# Patient Record
Sex: Male | Born: 1950 | Race: White | Hispanic: No | Smoking: Former smoker
Health system: Southern US, Community
[De-identification: ages and names within clinical notes are randomized; demographics above are authoritative.]

## PROBLEM LIST (undated history)

## (undated) DIAGNOSIS — K219 Gastro-esophageal reflux disease without esophagitis: Secondary | ICD-10-CM

## (undated) DIAGNOSIS — Z9889 Other specified postprocedural states: Secondary | ICD-10-CM

## (undated) DIAGNOSIS — N138 Other obstructive and reflux uropathy: Secondary | ICD-10-CM

## (undated) DIAGNOSIS — J45909 Unspecified asthma, uncomplicated: Secondary | ICD-10-CM

## (undated) DIAGNOSIS — H269 Unspecified cataract: Secondary | ICD-10-CM

## (undated) DIAGNOSIS — R7303 Prediabetes: Secondary | ICD-10-CM

## (undated) DIAGNOSIS — N400 Enlarged prostate without lower urinary tract symptoms: Secondary | ICD-10-CM

## (undated) DIAGNOSIS — I340 Nonrheumatic mitral (valve) insufficiency: Secondary | ICD-10-CM

## (undated) DIAGNOSIS — Z0389 Encounter for observation for other suspected diseases and conditions ruled out: Secondary | ICD-10-CM

## (undated) DIAGNOSIS — Z8679 Personal history of other diseases of the circulatory system: Secondary | ICD-10-CM

## (undated) DIAGNOSIS — R319 Hematuria, unspecified: Secondary | ICD-10-CM

## (undated) DIAGNOSIS — N200 Calculus of kidney: Secondary | ICD-10-CM

## (undated) DIAGNOSIS — G43909 Migraine, unspecified, not intractable, without status migrainosus: Secondary | ICD-10-CM

## (undated) DIAGNOSIS — N401 Enlarged prostate with lower urinary tract symptoms: Secondary | ICD-10-CM

## (undated) DIAGNOSIS — I480 Paroxysmal atrial fibrillation: Secondary | ICD-10-CM

## (undated) DIAGNOSIS — I341 Nonrheumatic mitral (valve) prolapse: Secondary | ICD-10-CM

## (undated) DIAGNOSIS — E785 Hyperlipidemia, unspecified: Secondary | ICD-10-CM

## (undated) HISTORY — PX: TONSILLECTOMY: SUR1361

## (undated) HISTORY — DX: Paroxysmal atrial fibrillation: I48.0

## (undated) HISTORY — DX: Nonrheumatic mitral (valve) insufficiency: I34.0

## (undated) HISTORY — DX: Hyperlipidemia, unspecified: E78.5

## (undated) HISTORY — PX: APPENDECTOMY: SHX54

## (undated) HISTORY — DX: Benign prostatic hyperplasia without lower urinary tract symptoms: N40.0

## (undated) HISTORY — DX: Migraine, unspecified, not intractable, without status migrainosus: G43.909

## (undated) HISTORY — DX: Calculus of kidney: N20.0

## (undated) HISTORY — DX: Other obstructive and reflux uropathy: N13.8

## (undated) HISTORY — PX: COLONOSCOPY: SHX174

## (undated) HISTORY — DX: Gastro-esophageal reflux disease without esophagitis: K21.9

## (undated) HISTORY — DX: Unspecified asthma, uncomplicated: J45.909

## (undated) HISTORY — PX: BACK SURGERY: SHX140

## (undated) HISTORY — DX: Nonrheumatic mitral (valve) prolapse: I34.1

## (undated) HISTORY — DX: Hematuria, unspecified: R31.9

## (undated) HISTORY — DX: Benign prostatic hyperplasia with lower urinary tract symptoms: N40.1

## (undated) HISTORY — DX: Encounter for observation for other suspected diseases and conditions ruled out: Z03.89

---
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
## 2014-04-17 DIAGNOSIS — J45909 Unspecified asthma, uncomplicated: Secondary | ICD-10-CM

## 2014-04-17 DIAGNOSIS — K219 Gastro-esophageal reflux disease without esophagitis: Secondary | ICD-10-CM

## 2014-04-17 DIAGNOSIS — G43909 Migraine, unspecified, not intractable, without status migrainosus: Secondary | ICD-10-CM

## 2014-04-17 DIAGNOSIS — E785 Hyperlipidemia, unspecified: Secondary | ICD-10-CM

## 2014-04-17 DIAGNOSIS — N138 Other obstructive and reflux uropathy: Secondary | ICD-10-CM

## 2014-04-17 DIAGNOSIS — R319 Hematuria, unspecified: Secondary | ICD-10-CM

## 2014-04-17 DIAGNOSIS — N401 Enlarged prostate with lower urinary tract symptoms: Secondary | ICD-10-CM

## 2014-04-17 HISTORY — DX: Gastro-esophageal reflux disease without esophagitis: K21.9

## 2014-04-17 HISTORY — DX: Other obstructive and reflux uropathy: N13.8

## 2014-04-17 HISTORY — DX: Migraine, unspecified, not intractable, without status migrainosus: G43.909

## 2014-04-17 HISTORY — DX: Hematuria, unspecified: R31.9

## 2014-04-17 HISTORY — DX: Hyperlipidemia, unspecified: E78.5

## 2014-04-17 HISTORY — DX: Unspecified asthma, uncomplicated: J45.909

## 2014-11-22 DIAGNOSIS — I341 Nonrheumatic mitral (valve) prolapse: Secondary | ICD-10-CM | POA: Insufficient documentation

## 2015-11-04 DIAGNOSIS — N200 Calculus of kidney: Secondary | ICD-10-CM

## 2015-11-04 DIAGNOSIS — I340 Nonrheumatic mitral (valve) insufficiency: Secondary | ICD-10-CM

## 2015-11-04 DIAGNOSIS — G43909 Migraine, unspecified, not intractable, without status migrainosus: Secondary | ICD-10-CM

## 2015-11-04 DIAGNOSIS — J45909 Unspecified asthma, uncomplicated: Secondary | ICD-10-CM

## 2015-11-04 DIAGNOSIS — E785 Hyperlipidemia, unspecified: Secondary | ICD-10-CM | POA: Insufficient documentation

## 2015-11-04 DIAGNOSIS — N400 Enlarged prostate without lower urinary tract symptoms: Secondary | ICD-10-CM

## 2015-11-04 DIAGNOSIS — I341 Nonrheumatic mitral (valve) prolapse: Secondary | ICD-10-CM

## 2015-11-04 HISTORY — DX: Unspecified asthma, uncomplicated: J45.909

## 2015-11-04 HISTORY — DX: Migraine, unspecified, not intractable, without status migrainosus: G43.909

## 2015-11-04 HISTORY — DX: Benign prostatic hyperplasia without lower urinary tract symptoms: N40.0

## 2015-11-04 HISTORY — DX: Calculus of kidney: N20.0

## 2015-11-04 HISTORY — DX: Nonrheumatic mitral (valve) insufficiency: I34.0

## 2015-11-04 HISTORY — DX: Nonrheumatic mitral (valve) prolapse: I34.1

## 2015-11-04 HISTORY — DX: Hyperlipidemia, unspecified: E78.5

## 2016-10-19 DIAGNOSIS — I340 Nonrheumatic mitral (valve) insufficiency: Secondary | ICD-10-CM

## 2016-10-19 HISTORY — DX: Nonrheumatic mitral (valve) insufficiency: I34.0

## 2017-10-10 DIAGNOSIS — Z0389 Encounter for observation for other suspected diseases and conditions ruled out: Secondary | ICD-10-CM

## 2017-10-10 DIAGNOSIS — IMO0001 Reserved for inherently not codable concepts without codable children: Secondary | ICD-10-CM

## 2017-10-10 HISTORY — DX: Reserved for inherently not codable concepts without codable children: IMO0001

## 2018-05-04 ENCOUNTER — Other Ambulatory Visit: Payer: Self-pay

## 2018-05-14 NOTE — Progress Notes (Signed)
Cardiology Office Note:    Date:  05/15/2018   ID:  Alejandro Burnett, DOB September 11, 1950, MRN [id number]  PCP:  Ignacia Burnett, Alejandro C., MD  Cardiologist:  Norman HerrlichBrian Haydon Dorris, MD   Referring MD: Ignacia Burnett, Alejandro C., MD  ASSESSMENT:    1. Mitral valve prolapse   2. Non-rheumatic mitral regurgitation   3. Dyslipidemia   4. Palpitation    PLAN:    In order of problems listed above:  1. I asked him to another echocardiogram performed with moderate to severe MR especially to evaluate left ventricular volume and function as a general course of primary mitral regurgitation is 1 of progressive deterioration.  If alterations in left ventricular volume function are clearly severe mitral regurgitation he would benefit from intervention.  In the interim we will continue to follow endocarditis prophylaxis 2. See above recheck echocardiogram 3. Continue statin 4. Continues to have infrequent episodes of palpitations associated with shortness of breath he assured me he will purchase an iPhone adapter to capture these particularly at risk for atrial fibrillation  Next appointment one year unless he requires intervention for his mitral valve   Medication Adjustments/Labs and Tests Ordered: Current medicines are reviewed at length with the patient today.  Concerns regarding medicines are outlined above.  Orders Placed This Encounter  Procedures  . EKG 12-Lead  . ECHOCARDIOGRAM COMPLETE   Meds ordered this encounter  Medications  . amoxicillin (AMOXIL) 500 MG capsule    Sig: Take 4 capsules 45 minutes prior to dental procedures.    Dispense:  20 capsule    Refill:  0     Chief Complaint  Patient presents with  . Mitral Regurgitation    History of Present Illness:    Alejandro CraverRandall L Mccannon is a 67 y.o. male who is being seen today for the evaluation of mitral valve disease at the request of Ignacia Burnett, Alejandro C., MD. I had seen him 10/20/16 for his MR as well as episodes of rapid HR. [facility] as per office Dr. Thomes LollingFulk.  He has a history of normal coronary angiography March 2016 and mitral regurgitation felt to be moderate to severe not advised intervention.  His last echocardiogram was performed 06/14/2017 results unavailable.  He also has a history of esophageal stricture with dilation duodenal ulcers and iron deficiency anemia.  In general he is done well he still has infrequent episodes lasting up to a day where he feels indigestion palpitation and mildly short of breath.  We suspect that he is having intermittent atrial arrhythmias never been captured I advised him to purchase an iPhone adapter to record rhythm in the past and he assures me he will do it now.  His last echocardiogram 1 year ago showed moderate to severe mitral regurgitation we will recheck to see if he requires intervention.  When not having palpitation he has no exercise intolerance dyspnea or chest pain.  He has not had no TIA or syncope.  He follows endocarditis prophylaxis with amoxicillin.  Past Medical History:  Diagnosis Date  . Asthma 11/04/2015  . Benign prostatic hyperplasia with urinary obstruction 04/17/2014  . BPH (benign prostatic hyperplasia) 11/04/2015  . Dyslipidemia 11/04/2015  . Gastroesophageal reflux disease 04/17/2014  . Hematuria 04/17/2014   Microscopic  . Hyperlipemia 04/17/2014  . Migraine headache 04/17/2014  . Migraines 11/04/2015  . Mitral regurgitation 11/04/2015  . Mitral valve prolapse 11/04/2015  . Nephrolithiasis 11/04/2015  . Non-rheumatic mitral regurgitation 10/19/2016  Moderate  . Normal coronary arteries 10/10/2017    Past Surgical History:  Procedure Laterality Date  . APPENDECTOMY    . BACK SURGERY    . TONSILLECTOMY      Current Medications: Current Meds  Medication Sig  . albuterol (PROVENTIL HFA;VENTOLIN HFA) 108 (90 Base) MCG/ACT inhaler Inhale 1 puff into the lungs every 6 (six) hours as needed for wheezing or shortness of breath.   . esomeprazole  (NEXIUM) 40 MG capsule Take 40 mg by mouth daily.   . finasteride (PROSCAR) 5 MG tablet Take 5 mg by mouth daily.   . fluticasone-salmeterol (ADVAIR HFA) 115-21 MCG/ACT inhaler Inhale 2 puffs into the lungs daily as needed.   . montelukast (SINGULAIR) 10 MG tablet Take 10 mg by mouth at bedtime.   . ranitidine (ZANTAC) 150 MG tablet Take 150 mg by mouth daily as needed.   . simvastatin (ZOCOR) 20 MG tablet Take 20 mg by mouth daily.      Allergies:   Patient has no known allergies.   Social History   Socioeconomic History  . Marital status: Married    Spouse name: Not on file  . Number of children: Not on file  . Years of education: Not on file  . Highest education level: Not on file  Occupational History  . Not on file  Social Needs  . Financial resource strain: Not on file  . Food insecurity:    Worry: Not on file    Inability: Not on file  . Transportation needs:    Medical: Not on file    Non-medical: Not on file  Tobacco Use  . Smoking status: Former Smoker    Last attempt to quit: 1986    Years since quitting: 33.7  . Smokeless tobacco: Former Engineer, water and Sexual Activity  . Alcohol use: Never    Frequency: Never  . Drug use: Never  . Sexual activity: Not on file  Lifestyle  . Physical activity:    Days per week: Not on file    Minutes per session: Not on file  . Stress: Not on file  Relationships  . Social connections:    Talks on phone: Not on file    Gets together: Not on file    Attends religious service: Not on file    Active member of club or organization: Not on file    Attends meetings of clubs or organizations: Not on file    Relationship status: Not on file  Other Topics Concern  . Not on file  Social History Narrative  . Not on file     Family History: The patient's family history includes Cancer in his father; Hypertension in his mother.  ROS:   Review of Systems  Constitution: Negative.  HENT: Negative.   Eyes: Negative.     Cardiovascular: Positive for palpitations.  Respiratory: Positive for shortness of breath.   Endocrine: Negative.   Hematologic/Lymphatic: Negative.   Skin: Negative.   Musculoskeletal: Negative.   Gastrointestinal: Negative.   Genitourinary: Negative.   Psychiatric/Behavioral: Negative.   Allergic/Immunologic: Negative.    Please see the history of present illness.     All other systems reviewed and are negative.  EKGs/Labs/Other Studies Reviewed:    The following studies were reviewed today:   EKG:  EKG is  ordered today.  The ekg ordered today demonstrates SRTH normal  Recent Labs: No results found for requested labs within last 8760 hours.  Recent Lipid Panel No results found  for: CHOL, TRIG, HDL, CHOLHDL, VLDL, LDLCALC, LDLDIRECT  Physical Exam:    VS:  BP 118/78 (BP Location: Right Arm, Patient Position: Sitting, Cuff Size: Normal)   Pulse 81   Ht 6\' 1"  (1.854 m)   Wt 195 lb 9.6 oz (88.7 kg)   SpO2 95%   BMI 25.81 kg/m     Wt Readings from Last 3 Encounters:  05/15/18 195 lb 9.6 oz (88.7 kg)     GEN:  Well nourished, well developed in no acute distress HEENT: Normal NECK: No JVD; No carotid bruits LYMPHATICS: No lymphadenopathy CARDIAC: 3/6 MR radiated to aortic area RRR, RESPIRATORY:  Clear to auscultation without rales, wheezing or rhonchi  ABDOMEN: Soft, non-tender, non-distended MUSCULOSKELETAL:  No edema; No deformity  SKIN: Warm and dry NEUROLOGIC:  Alert and oriented x 3 PSYCHIATRIC:  Normal affect     Signed, Norman Herrlich, MD  05/15/2018 2:05 PM    [facility]

## 2018-05-15 ENCOUNTER — Ambulatory Visit (INDEPENDENT_AMBULATORY_CARE_PROVIDER_SITE_OTHER): Payer: Medicare Other | Admitting: Cardiology

## 2018-05-15 VITALS — BP 118/78 | HR 81 | Ht 73.0 in | Wt 195.6 lb

## 2018-05-15 DIAGNOSIS — E785 Hyperlipidemia, unspecified: Secondary | ICD-10-CM

## 2018-05-15 DIAGNOSIS — I340 Nonrheumatic mitral (valve) insufficiency: Secondary | ICD-10-CM | POA: Diagnosis not present

## 2018-05-15 DIAGNOSIS — I341 Nonrheumatic mitral (valve) prolapse: Secondary | ICD-10-CM | POA: Diagnosis not present

## 2018-05-15 DIAGNOSIS — R002 Palpitations: Secondary | ICD-10-CM | POA: Insufficient documentation

## 2018-05-15 HISTORY — DX: Palpitations: R00.2

## 2018-05-15 MED ORDER — AMOXICILLIN 500 MG PO CAPS: ORAL_CAPSULE | ORAL | 0 refills | Status: DC

## 2018-05-15 NOTE — Patient Instructions (Addendum)
Medication Instructions:  Your physician has recommended you make the following change in your medication:   START amoxicillin 500 mg: Take 4 capsules 45 minutes prior to dental procedures  Labwork: None  Testing/Procedures: You had an EKG today.   Your physician has requested that you have an echocardiogram. Echocardiography is a painless test that uses sound waves to create images of your heart. It provides your doctor with information about the size and shape of your heart and how well your heart's chambers and valves are working. This procedure takes approximately one hour. There are no restrictions for this procedure.  Follow-Up: Your physician wants you to follow-up in: 1 year. You will receive a reminder letter in the mail two months in advance. If you don't receive a letter, please call our office to schedule the follow-up appointment.   If you need a refill on your cardiac medications before your next appointment, please call your pharmacy.   Thank you for choosing [facility]! Mady Gemmaatherine Lockhart, RN [phone]     [email or web address] [email or web address]        FDA-cleared, clinical grade mobile EKG monitor: Lourena SimmondsKardia is the most clinically-validated mobile EKG used by the world's leading cardiac care medical professionals With Basic service, know instantly if your heart rhythm is normal or if atrial fibrillation is detected, and email the last single EKG recording to yourself or your doctor Premium service, available for purchase through the Kardia app for $9.99 per month or $99 per year, includes unlimited history and storage of your EKG recordings, a monthly EKG summary report to share with your doctor, along with the ability to track your blood pressure, activity and weight Includes one KardiaMobile phone clip FREE SHIPPING: Standard delivery 1-3 business days. Orders placed by 11:00am PST will ship that afternoon. Otherwise, will ship next  business day. All orders ship via [employer] Priority Mail from [place], [place]    Echocardiogram An echocardiogram, or echocardiography, uses sound waves (ultrasound) to produce an image of your heart. The echocardiogram is simple, painless, obtained within a short period of time, and offers valuable information to your health care provider. The images from an echocardiogram can provide information such as:  Evidence of coronary artery disease (CAD).  Heart size.  Heart muscle function.  Heart valve function.  Aneurysm detection.  Evidence of a past heart attack.  Fluid buildup around the heart.  Heart muscle thickening.  Assess heart valve function.  Tell a health care provider about:  Any allergies you have.  All medicines you are taking, including vitamins, herbs, eye drops, creams, and over-the-counter medicines.  Any problems you or family members have had with anesthetic medicines.  Any blood disorders you have.  Any surgeries you have had.  Any medical conditions you have.  Whether you are pregnant or may be pregnant. What happens before the procedure? No special preparation is needed. Eat and drink normally. What happens during the procedure?  In order to produce an image of your heart, gel will be applied to your chest and a wand-like tool (transducer) will be moved over your chest. The gel will help transmit the sound waves from the transducer. The sound waves will harmlessly bounce off your heart to allow the heart images to be captured in real-time motion. These images will then be recorded.  You may need an IV to receive a medicine that improves the quality of the pictures. What happens after the procedure? You may return to your normal schedule including diet, activities, and medicines,  unless your health care provider tells you otherwise. This information is not intended to replace advice given to you by your health care provider. Make sure you discuss any  questions you have with your health care provider. Document Released: 08/06/2000 Document Revised: 03/27/2016 Document Reviewed: 04/16/2013 Elsevier Interactive Patient Education  2017 Elsevier Inc.   Amoxicillin capsules or tablets What is this medicine? AMOXICILLIN (a mox i SIL in) is a penicillin antibiotic. It is used to treat certain kinds of bacterial infections. It will not work for colds, flu, or other viral infections. This medicine may be used for other purposes; ask your health care provider or pharmacist if you have questions. COMMON BRAND NAME(S): Amoxil, Moxilin, Sumox, Trimox What should I tell my health care provider before I take this medicine? They need to know if you have any of these conditions: -asthma -kidney disease -an unusual or allergic reaction to amoxicillin, other penicillins, cephalosporin antibiotics, other medicines, foods, dyes, or preservatives -pregnant or trying to get pregnant -breast-feeding How should I use this medicine? Take this medicine by mouth with a glass of water. Follow the directions on your prescription label. You may take this medicine with food or on an empty stomach. Take your medicine at regular intervals. Do not take your medicine more often than directed. Take all of your medicine as directed even if you think your are better. Do not skip doses or stop your medicine early. Talk to your pediatrician regarding the use of this medicine in children. While this drug may be prescribed for selected conditions, precautions do apply. Overdosage: If you think you have taken too much of this medicine contact a poison control center or emergency room at once. NOTE: This medicine is only for you. Do not share this medicine with others. What if I miss a dose? If you miss a dose, take it as soon as you can. If it is almost time for your next dose, take only that dose. Do not take double or extra doses. What may interact with this  medicine? -amiloride -birth control pills -chloramphenicol -macrolides -probenecid -sulfonamides -tetracyclines This list may not describe all possible interactions. Give your health care provider a list of all the medicines, herbs, non-prescription drugs, or dietary supplements you use. Also tell them if you smoke, drink alcohol, or use illegal drugs. Some items may interact with your medicine. What should I watch for while using this medicine? Tell your doctor or health care professional if your symptoms do not improve in 2 or 3 days. Take all of the doses of your medicine as directed. Do not skip doses or stop your medicine early. If you are diabetic, you may get a false positive result for sugar in your urine with certain brands of urine tests. Check with your doctor. Do not treat diarrhea with over-the-counter products. Contact your doctor if you have diarrhea that lasts more than 2 days or if the diarrhea is severe and watery. What side effects may I notice from receiving this medicine? Side effects that you should report to your doctor or health care professional as soon as possible: -allergic reactions like skin rash, itching or hives, swelling of the face, lips, or tongue -breathing problems -dark urine -redness, blistering, peeling or loosening of the skin, including inside the mouth -seizures -severe or watery diarrhea -trouble passing urine or change in the amount of urine -unusual bleeding or bruising -unusually weak or tired -yellowing of the eyes or skin Side effects that usually do not require  medical attention (report to your doctor or health care professional if they continue or are bothersome): -dizziness -headache -stomach upset -trouble sleeping This list may not describe all possible side effects. Call your doctor for medical advice about side effects. You may report side effects to FDA at 1-800-FDA-1088. Where should I keep my medicine? Keep out of the reach of  children. Store between 68 and 77 degrees F (20 and 25 degrees C). Keep bottle closed tightly. Throw away any unused medicine after the expiration date. NOTE: This sheet is a summary. It may not cover all possible information. If you have questions about this medicine, talk to your doctor, pharmacist, or health care provider.  2018 Elsevier/Gold Standard (2007-10-31 14:10:59)

## 2018-06-27 ENCOUNTER — Ambulatory Visit (INDEPENDENT_AMBULATORY_CARE_PROVIDER_SITE_OTHER): Payer: Medicare Other

## 2018-06-27 DIAGNOSIS — I341 Nonrheumatic mitral (valve) prolapse: Secondary | ICD-10-CM

## 2018-06-27 DIAGNOSIS — I340 Nonrheumatic mitral (valve) insufficiency: Secondary | ICD-10-CM

## 2018-06-27 NOTE — Progress Notes (Signed)
Complete echocardiogram has been performed.  Jimmy Vetta Couzens RDCS, RVT 

## 2018-08-07 NOTE — H&P (View-Only) (Signed)
Cardiology Office Note:    Date:  08/08/2018   ID:  Alejandro Burnett, DOB 01/11/1951, MRN [id number]  PCP:  Ignacia Burnett, Alejandro C., MD  Cardiologist:  Alejandro HerrlichBrian Kinslee Dalpe, MD    Referring MD: Ignacia Burnett, Alejandro C., MD    ASSESSMENT:    1. Mitral valve prolapse   2. Nonrheumatic mitral valve regurgitation   3. Chest pain, unspecified type    PLAN:    In order of problems listed above:  1. Clinically with symptoms physical exam I suspect he is progressed to severe mitral regurgitation previously he had moderate to severe I cannot explain the recent echocardiogram suggesting was mild I have asked him to undergo transesophageal echocardiogram and likely will require consideration of mitral valve repair. 2. Clinically symptomatic although he is not in heart failure he will go and have a transesophageal echocardiogram and further decision-making. 3. Vague symptoms nonanginal at this time will hold on ischemia evaluation as he had normal coronary arteriography in 2016   Next appointment: After his transesophageal echocardiogram the first week of January   Medication Adjustments/Labs and Tests Ordered: Current medicines are reviewed at length with the patient today.  Concerns regarding medicines are outlined above.  Orders Placed This Encounter  Procedures  . CBC  . Basic metabolic panel  . EKG 12-Lead   No orders of the defined types were placed in this encounter.   Chief Complaint  Patient presents with  . Follow-up    at patient request for chest pain    History of Present Illness:    Alejandro Burnett is a 67 y.o. male with a hx of MVP MR and palpitation last seen 05/15/18.  He has a history of normal coronary angiography March 2016 and mitral regurgitation felt to be moderate to severe not advised intervention.  His last echocardiogram was performed 06/14/2017 results unavailable.  He also has a history of esophageal stricture with dilation duodenal ulcers and iron deficiency anemia. Compliance  with diet, lifestyle and medications: Yes  He has not felt well for several weeks there is a day that he was loading a truck and he became very weak lightheaded near syncope and was unable to do physical effort he required assistance.  Since that time he sometimes finds himself short of breath walking outdoors doing activities that were present previously easy and more aware of her heart beating with irregularity.  In summary he just has never felt the same and his predominant problem is exercise intolerance and dyspnea.  His physical examination is very prominent for mitral regurgitation and severely out of proportion to recent echocardiogram suggesting a mild degree I will myself at the end of office hours review that echo "by physical exam he has severe mitral regurgitation he has progressed quickly clearly has a phenotype of mitral valve prolapse with chest wall deformity and scoliosis and I suspect that he has progressed to severe symptomatic mitral regurgitation.  He has been referred for transesophageal echocardiogram.  By EKG he is in sinus rhythm and has no evidence of overt congestive heart failure he has had no fever or chills.  Benefits risk and options detailed consent obtained for TEE.  He has no edema orthopnea or syncope.  I reviewed his recent transthoracic echocardiogram.  He has severe bileaflet prolapse and has a jet directed anteriorly and medially and I suspect that this is more severe than mild as it was interpreted and there is flow reversal in the pulmonary vein. Past Medical History:  Diagnosis  Date  . Asthma 11/04/2015  . Benign prostatic hyperplasia with urinary obstruction 04/17/2014  . BPH (benign prostatic hyperplasia) 11/04/2015  . Dyslipidemia 11/04/2015  . Gastroesophageal reflux disease 04/17/2014  . Hematuria 04/17/2014   Microscopic  . Hyperlipemia 04/17/2014  . Migraine headache 04/17/2014  . Migraines 11/04/2015  . Mitral regurgitation 11/04/2015  . Mitral valve  prolapse 11/04/2015  . Nephrolithiasis 11/04/2015  . Non-rheumatic mitral regurgitation 10/19/2016   Moderate  . Normal coronary arteries 10/10/2017    Past Surgical History:  Procedure Laterality Date  . APPENDECTOMY    . BACK SURGERY    . TONSILLECTOMY      Current Medications: Current Meds  Medication Sig  . albuterol (PROVENTIL HFA;VENTOLIN HFA) 108 (90 Base) MCG/ACT inhaler Inhale 1 puff into the lungs every 6 (six) hours as needed for wheezing or shortness of breath.   Marland Kitchen amoxicillin (AMOXIL) 500 MG capsule Take 4 capsules 45 minutes prior to dental procedures.  Marland Kitchen esomeprazole (NEXIUM) 40 MG capsule Take 40 mg by mouth daily.   . finasteride (PROSCAR) 5 MG tablet Take 5 mg by mouth daily.   . fluticasone-salmeterol (ADVAIR HFA) 115-21 MCG/ACT inhaler Inhale 2 puffs into the lungs daily as needed.   . montelukast (SINGULAIR) 10 MG tablet Take 10 mg by mouth at bedtime.   . ranitidine (ZANTAC) 150 MG tablet Take 150 mg by mouth daily as needed.   . simvastatin (ZOCOR) 20 MG tablet Take 20 mg by mouth daily.      Allergies:   Patient has no known allergies.   Social History   Socioeconomic History  . Marital status: Married    Spouse name: Not on file  . Number of children: Not on file  . Years of education: Not on file  . Highest education level: Not on file  Occupational History  . Not on file  Social Needs  . Financial resource strain: Not on file  . Food insecurity:    Worry: Not on file    Inability: Not on file  . Transportation needs:    Medical: Not on file    Non-medical: Not on file  Tobacco Use  . Smoking status: Former Smoker    Last attempt to quit: 1986    Years since quitting: 33.9  . Smokeless tobacco: Former Engineer, water and Sexual Activity  . Alcohol use: Never    Frequency: Never  . Drug use: Never  . Sexual activity: Not on file  Lifestyle  . Physical activity:    Days per week: Not on file    Minutes per session: Not on file  .  Stress: Not on file  Relationships  . Social connections:    Talks on phone: Not on file    Gets together: Not on file    Attends religious service: Not on file    Active member of club or organization: Not on file    Attends meetings of clubs or organizations: Not on file    Relationship status: Not on file  Other Topics Concern  . Not on file  Social History Narrative  . Not on file     Family History: The patient's family history includes Cancer in his father; Hypertension in his mother. ROS:   Please see the history of present illness.    All other systems reviewed and are negative.  EKGs/Labs/Other Studies Reviewed:    The following studies were reviewed today:  EKG:  EKG ordered today.  The ekg ordered today  demonstrates sinus rhythm single PVC  Recent Labs: No results found for requested labs within last 8760 hours.  Recent Lipid Panel No results found for: CHOL, TRIG, HDL, CHOLHDL, VLDL, LDLCALC, LDLDIRECT  Physical Exam:    VS:  BP (!) 152/88 (BP Location: Right Arm, Patient Position: Sitting, Cuff Size: Normal)   Pulse 82   Ht 6\' 1"  (1.854 m)   Wt 190 lb 2 oz (86.2 kg)   SpO2 97%   BMI 25.08 kg/m     Wt Readings from Last 3 Encounters:  08/08/18 190 lb 2 oz (86.2 kg)  05/15/18 195 lb 9.6 oz (88.7 kg)     GEN: Tall thin scoliosis pectus excavatum deformity well nourished, well developed in no acute distress HEENT: Normal NECK: No JVD; No carotid bruits LYMPHATICS: No lymphadenopathy CARDIAC: He has a grade 4/6 murmur of mitral regurgitation and radiates through the posterior scapula up into the aortic area and has an apical thrill.  P2 is normal.  RRR, rubs, gallops RESPIRATORY:  Clear to auscultation without rales, wheezing or rhonchi  ABDOMEN: Soft, non-tender, non-distended MUSCULOSKELETAL:  No edema; No deformity  SKIN: Warm and dry NEUROLOGIC:  Alert and oriented x 3 PSYCHIATRIC:  Normal affect    Signed, Alejandro Herrlich, MD  08/08/2018 4:47 PM     [facility]

## 2018-08-07 NOTE — Progress Notes (Addendum)
Cardiology Office Note:    Date:  08/08/2018   ID:  Alejandro Burnett, DOB 11/07/1950, MRN [id number]  PCP:  Beck, Mark C., MD  Cardiologist:  Filicia Scogin, MD    Referring MD: Beck, Mark C., MD    ASSESSMENT:    1. Mitral valve prolapse   2. Nonrheumatic mitral valve regurgitation   3. Chest pain, unspecified type    PLAN:    In order of problems listed above:  1. Clinically with symptoms physical exam I suspect he is progressed to severe mitral regurgitation previously he had moderate to severe I cannot explain the recent echocardiogram suggesting was mild I have asked him to undergo transesophageal echocardiogram and likely will require consideration of mitral valve repair. 2. Clinically symptomatic although he is not in heart failure he will go and have a transesophageal echocardiogram and further decision-making. 3. Vague symptoms nonanginal at this time will hold on ischemia evaluation as he had normal coronary arteriography in 2016   Next appointment: After his transesophageal echocardiogram the first week of January   Medication Adjustments/Labs and Tests Ordered: Current medicines are reviewed at length with the patient today.  Concerns regarding medicines are outlined above.  Orders Placed This Encounter  Procedures  . CBC  . Basic metabolic panel  . EKG 12-Lead   No orders of the defined types were placed in this encounter.   Chief Complaint  Patient presents with  . Follow-up    at patient request for chest pain    History of Present Illness:    Alejandro Burnett is a 67 y.o. male with a hx of MVP MR and palpitation last seen 05/15/18.  He has a history of normal coronary angiography March 2016 and mitral regurgitation felt to be moderate to severe not advised intervention.  His last echocardiogram was performed 06/14/2017 results unavailable.  He also has a history of esophageal stricture with dilation duodenal ulcers and iron deficiency anemia. Compliance  with diet, lifestyle and medications: Yes  He has not felt well for several weeks there is a day that he was loading a truck and he became very weak lightheaded near syncope and was unable to do physical effort he required assistance.  Since that time he sometimes finds himself short of breath walking outdoors doing activities that were present previously easy and more aware of her heart beating with irregularity.  In summary he just has never felt the same and his predominant problem is exercise intolerance and dyspnea.  His physical examination is very prominent for mitral regurgitation and severely out of proportion to recent echocardiogram suggesting a mild degree I will myself at the end of office hours review that echo "by physical exam he has severe mitral regurgitation he has progressed quickly clearly has a phenotype of mitral valve prolapse with chest wall deformity and scoliosis and I suspect that he has progressed to severe symptomatic mitral regurgitation.  He has been referred for transesophageal echocardiogram.  By EKG he is in sinus rhythm and has no evidence of overt congestive heart failure he has had no fever or chills.  Benefits risk and options detailed consent obtained for TEE.  He has no edema orthopnea or syncope.  I reviewed his recent transthoracic echocardiogram.  He has severe bileaflet prolapse and has a jet directed anteriorly and medially and I suspect that this is more severe than mild as it was interpreted and there is flow reversal in the pulmonary vein. Past Medical History:  Diagnosis   Date  . Asthma 11/04/2015  . Benign prostatic hyperplasia with urinary obstruction 04/17/2014  . BPH (benign prostatic hyperplasia) 11/04/2015  . Dyslipidemia 11/04/2015  . Gastroesophageal reflux disease 04/17/2014  . Hematuria 04/17/2014   Microscopic  . Hyperlipemia 04/17/2014  . Migraine headache 04/17/2014  . Migraines 11/04/2015  . Mitral regurgitation 11/04/2015  . Mitral valve  prolapse 11/04/2015  . Nephrolithiasis 11/04/2015  . Non-rheumatic mitral regurgitation 10/19/2016   Moderate  . Normal coronary arteries 10/10/2017    Past Surgical History:  Procedure Laterality Date  . APPENDECTOMY    . BACK SURGERY    . TONSILLECTOMY      Current Medications: Current Meds  Medication Sig  . albuterol (PROVENTIL HFA;VENTOLIN HFA) 108 (90 Base) MCG/ACT inhaler Inhale 1 puff into the lungs every 6 (six) hours as needed for wheezing or shortness of breath.   . amoxicillin (AMOXIL) 500 MG capsule Take 4 capsules 45 minutes prior to dental procedures.  . esomeprazole (NEXIUM) 40 MG capsule Take 40 mg by mouth daily.   . finasteride (PROSCAR) 5 MG tablet Take 5 mg by mouth daily.   . fluticasone-salmeterol (ADVAIR HFA) 115-21 MCG/ACT inhaler Inhale 2 puffs into the lungs daily as needed.   . montelukast (SINGULAIR) 10 MG tablet Take 10 mg by mouth at bedtime.   . ranitidine (ZANTAC) 150 MG tablet Take 150 mg by mouth daily as needed.   . simvastatin (ZOCOR) 20 MG tablet Take 20 mg by mouth daily.      Allergies:   Patient has no known allergies.   Social History   Socioeconomic History  . Marital status: Married    Spouse name: Not on file  . Number of children: Not on file  . Years of education: Not on file  . Highest education level: Not on file  Occupational History  . Not on file  Social Needs  . Financial resource strain: Not on file  . Food insecurity:    Worry: Not on file    Inability: Not on file  . Transportation needs:    Medical: Not on file    Non-medical: Not on file  Tobacco Use  . Smoking status: Former Smoker    Last attempt to quit: 1986    Years since quitting: 33.9  . Smokeless tobacco: Former User  Substance and Sexual Activity  . Alcohol use: Never    Frequency: Never  . Drug use: Never  . Sexual activity: Not on file  Lifestyle  . Physical activity:    Days per week: Not on file    Minutes per session: Not on file  .  Stress: Not on file  Relationships  . Social connections:    Talks on phone: Not on file    Gets together: Not on file    Attends religious service: Not on file    Active member of club or organization: Not on file    Attends meetings of clubs or organizations: Not on file    Relationship status: Not on file  Other Topics Concern  . Not on file  Social History Narrative  . Not on file     Family History: The patient's family history includes Cancer in his father; Hypertension in his mother. ROS:   Please see the history of present illness.    All other systems reviewed and are negative.  EKGs/Labs/Other Studies Reviewed:    The following studies were reviewed today:  EKG:  EKG ordered today.  The ekg ordered today   demonstrates sinus rhythm single PVC  Recent Labs: No results found for requested labs within last 8760 hours.  Recent Lipid Panel No results found for: CHOL, TRIG, HDL, CHOLHDL, VLDL, LDLCALC, LDLDIRECT  Physical Exam:    VS:  BP (!) 152/88 (BP Location: Right Arm, Patient Position: Sitting, Cuff Size: Normal)   Pulse 82   Ht 6' 1" (1.854 m)   Wt 190 lb 2 oz (86.2 kg)   SpO2 97%   BMI 25.08 kg/m     Wt Readings from Last 3 Encounters:  08/08/18 190 lb 2 oz (86.2 kg)  05/15/18 195 lb 9.6 oz (88.7 kg)     GEN: Tall thin scoliosis pectus excavatum deformity well nourished, well developed in no acute distress HEENT: Normal NECK: No JVD; No carotid bruits LYMPHATICS: No lymphadenopathy CARDIAC: He has a grade 4/6 murmur of mitral regurgitation and radiates through the posterior scapula up into the aortic area and has an apical thrill.  P2 is normal.  RRR, rubs, gallops RESPIRATORY:  Clear to auscultation without rales, wheezing or rhonchi  ABDOMEN: Soft, non-tender, non-distended MUSCULOSKELETAL:  No edema; No deformity  SKIN: Warm and dry NEUROLOGIC:  Alert and oriented x 3 PSYCHIATRIC:  Normal affect    Signed, Carleigh Buccieri, MD  08/08/2018 4:47 PM     [facility]  

## 2018-08-08 ENCOUNTER — Encounter: Payer: Self-pay | Admitting: Cardiology

## 2018-08-08 ENCOUNTER — Ambulatory Visit (INDEPENDENT_AMBULATORY_CARE_PROVIDER_SITE_OTHER): Payer: Medicare Other | Admitting: Cardiology

## 2018-08-08 VITALS — BP 152/88 | HR 82 | Ht 73.0 in | Wt 190.1 lb

## 2018-08-08 DIAGNOSIS — I341 Nonrheumatic mitral (valve) prolapse: Secondary | ICD-10-CM

## 2018-08-08 DIAGNOSIS — R079 Chest pain, unspecified: Secondary | ICD-10-CM

## 2018-08-08 DIAGNOSIS — I340 Nonrheumatic mitral (valve) insufficiency: Secondary | ICD-10-CM | POA: Diagnosis not present

## 2018-08-08 HISTORY — DX: Chest pain, unspecified: R07.9

## 2018-08-08 NOTE — Patient Instructions (Signed)
Medication Instructions:  Your physician recommends that you continue on your current medications as directed. Please refer to the Current Medication list given to you today.  If you need a refill on your cardiac medications before your next appointment, please call your pharmacy.   Lab work:  Please return to our office for lab work on 08-17-18: CBC and BMP  Testing/Procedures: You are scheduled for a TEE/Cardioversion/TEE Cardioversion on Jan. 2, 2020  with Dr. Moreen Fowlerroitora.  Please [facility]ance [facility] at [facility]: [address] [place], [place] [address] at 11 am/pm. (1 hour prior to procedure unless lab work is needed; if lab work is needed arrive 1.5 hours ahead)  DIET: Nothing to eat or drink after midnight except a sip of water with medications (see medication instructions below)  Medication Instructions:   Labs:  Please return to office for CBC and BMP next week on 08-17-18 You must have a responsible person to drive you home and stay in the waiting area during your procedure. Failure to do so could result in cancellation.  Bring your insurance cards.  *Special Note: Every effort is made to have your procedure done on time. Occasionally there are emergencies that occur at the hospital that may cause delays. Please be patient if a delay does occur.    Follow-Up: At [facility], you and your health needs are our priority.  As part of our continuing mission to provide you with exceptional heart care, we have created designated Provider Care Teams.  These Care Teams include your primary Cardiologist (physician) and Advanced Practice Providers (APPs -  Physician Assistants and Nurse Practitioners) who all work together to provide you with the care you need, when you need it. You will need a follow up appointment in Jan 2020 after procedure.

## 2018-08-18 LAB — CBC
Hematocrit: 40.9 % (ref 37.5–51.0)
Hemoglobin: 13.8 g/dL (ref 13.0–17.7)
MCH: 29.8 pg (ref 26.6–33.0)
MCHC: 33.7 g/dL (ref 31.5–35.7)
MCV: 88 fL (ref 79–97)
Platelets: 142 10*3/uL — ABNORMAL LOW (ref 150–450)
RBC: 4.63 x10E6/uL (ref 4.14–5.80)
RDW: 12 % — AB (ref 12.3–15.4)
WBC: 6.5 10*3/uL (ref 3.4–10.8)

## 2018-08-18 LAB — BASIC METABOLIC PANEL
BUN / CREAT RATIO: 18 (ref 10–24)
BUN: 21 mg/dL (ref 8–27)
CO2: 22 mmol/L (ref 20–29)
Calcium: 9.2 mg/dL (ref 8.6–10.2)
Chloride: 105 mmol/L (ref 96–106)
Creatinine, Ser: 1.14 mg/dL (ref 0.76–1.27)
GFR calc non Af Amer: 67 mL/min/{1.73_m2} (ref >59)
GFR, EST AFRICAN AMERICAN: 78 mL/min/{1.73_m2} (ref >59)
Glucose: 120 mg/dL — ABNORMAL HIGH (ref 65–99)
Potassium: 3.9 mmol/L (ref 3.5–5.2)
Sodium: 140 mmol/L (ref 134–144)

## 2018-08-24 ENCOUNTER — Encounter (HOSPITAL_COMMUNITY): Payer: Self-pay | Admitting: *Deleted

## 2018-08-24 ENCOUNTER — Ambulatory Visit (HOSPITAL_COMMUNITY)
Admission: RE | Admit: 2018-08-24 | Discharge: 2018-08-24 | Disposition: A | Discharge status: Home / Self Care | Payer: Medicare Other | Attending: Cardiovascular Disease | Admitting: Cardiovascular Disease

## 2018-08-24 ENCOUNTER — Ambulatory Visit (HOSPITAL_BASED_OUTPATIENT_CLINIC_OR_DEPARTMENT_OTHER): Payer: Medicare Other

## 2018-08-24 ENCOUNTER — Other Ambulatory Visit: Payer: Self-pay

## 2018-08-24 ENCOUNTER — Encounter (HOSPITAL_COMMUNITY)
Admission: RE | Disposition: A | Discharge status: Home / Self Care | Payer: Self-pay | Source: Home / Self Care | Attending: Cardiovascular Disease

## 2018-08-24 DIAGNOSIS — J45909 Unspecified asthma, uncomplicated: Secondary | ICD-10-CM | POA: Diagnosis not present

## 2018-08-24 DIAGNOSIS — I340 Nonrheumatic mitral (valve) insufficiency: Secondary | ICD-10-CM | POA: Insufficient documentation

## 2018-08-24 DIAGNOSIS — Z87891 Personal history of nicotine dependence: Secondary | ICD-10-CM | POA: Diagnosis not present

## 2018-08-24 DIAGNOSIS — N401 Enlarged prostate with lower urinary tract symptoms: Secondary | ICD-10-CM | POA: Insufficient documentation

## 2018-08-24 DIAGNOSIS — E785 Hyperlipidemia, unspecified: Secondary | ICD-10-CM | POA: Diagnosis not present

## 2018-08-24 DIAGNOSIS — K219 Gastro-esophageal reflux disease without esophagitis: Secondary | ICD-10-CM | POA: Diagnosis not present

## 2018-08-24 DIAGNOSIS — G43909 Migraine, unspecified, not intractable, without status migrainosus: Secondary | ICD-10-CM | POA: Diagnosis not present

## 2018-08-24 DIAGNOSIS — N138 Other obstructive and reflux uropathy: Secondary | ICD-10-CM | POA: Diagnosis not present

## 2018-08-24 DIAGNOSIS — I341 Nonrheumatic mitral (valve) prolapse: Secondary | ICD-10-CM

## 2018-08-24 HISTORY — PX: TEE WITHOUT CARDIOVERSION: SHX5443

## 2018-08-24 SURGERY — ECHOCARDIOGRAM, TRANSESOPHAGEAL
Anesthesia: Moderate Sedation

## 2018-08-24 MED ORDER — DIPHENHYDRAMINE HCL 50 MG/ML IJ SOLN
INTRAMUSCULAR | Status: AC
  Filled 2018-08-24: qty 1

## 2018-08-24 MED ORDER — LIDOCAINE VISCOUS HCL 2 % MT SOLN
OROMUCOSAL | Status: AC
  Filled 2018-08-24: qty 15

## 2018-08-24 MED ORDER — MIDAZOLAM HCL (PF) 5 MG/ML IJ SOLN
INTRAMUSCULAR | Status: AC
  Filled 2018-08-24: qty 2

## 2018-08-24 MED ORDER — FENTANYL CITRATE (PF) 100 MCG/2ML IJ SOLN
INTRAMUSCULAR | Status: DC | PRN
  Administered 2018-08-24 (×2): 25 ug via INTRAVENOUS

## 2018-08-24 MED ORDER — SODIUM CHLORIDE 0.9 % IV SOLN
INTRAVENOUS | Status: DC
  Administered 2018-08-24: 10:00:00 via INTRAVENOUS

## 2018-08-24 MED ORDER — MIDAZOLAM HCL (PF) 10 MG/2ML IJ SOLN
INTRAMUSCULAR | Status: DC | PRN
  Administered 2018-08-24 (×2): 2 mg via INTRAVENOUS

## 2018-08-24 MED ORDER — LIDOCAINE VISCOUS HCL 2 % MT SOLN
OROMUCOSAL | Status: DC | PRN
  Administered 2018-08-24: 10 mL via OROMUCOSAL

## 2018-08-24 MED ORDER — FENTANYL CITRATE (PF) 100 MCG/2ML IJ SOLN
INTRAMUSCULAR | Status: AC
  Filled 2018-08-24: qty 2

## 2018-08-24 NOTE — Op Note (Signed)
INDICATIONS: mitral regurgitation  PROCEDURE:   Informed consent was obtained prior to the procedure. The risks, benefits and alternatives for the procedure were discussed and the patient comprehended these risks.  Risks include, but are not limited to, cough, sore throat, vomiting, nausea, somnolence, esophageal and stomach trauma or perforation, bleeding, low blood pressure, aspiration, pneumonia, infection, trauma to the teeth and death.    After a procedural time-out, the oropharynx was anesthetized with 20% benzocaine spray.   During this procedure the patient was administered a total of Versed 4 mg and Fentanyl 50 mcg IV to achieve and maintain moderate conscious sedation.  The patient's heart rate, blood pressure, and oxygen saturationweare monitored continuously during the procedure. The period of conscious sedation was 18 minutes, of which I was present face-to-face 100% of this time.  The transesophageal probe was inserted in the esophagus and stomach without difficulty and multiple views were obtained.  The patient was kept under observation until the patient left the procedure room.  The patient left the procedure room in stable condition.   Agitated microbubble saline contrast was not administered.  COMPLICATIONS:    There were no immediate complications.  FINDINGS:   Myxomatous mitral degeneration, mildly thickened leaflets with bileaflet prolapse. Moderate-to-severe mitral insufficiency due to ruptured chordae tendinae to the P2 scallop of the posterior leaflet. Otherwise normal echo- normal LV size and function.  RECOMMENDATIONS:     Consider surgical repair if the patient is symptomatic.  Time Spent Directly with the Patient:  30 minutes   Alejandro Burnett 08/24/2018, 12:15 PM

## 2018-08-24 NOTE — Discharge Instructions (Signed)

## 2018-08-24 NOTE — Progress Notes (Signed)
*  PRELIMINARY RESULTS* Echocardiogram Echocardiogram Transesophageal has been performed.  Pieter Partridge 08/24/2018,

## 2018-08-24 NOTE — Interval H&P Note (Signed)
History and Physical Interval Note:  08/24/2018 11:12 AM  Alejandro Burnett  has presented today for surgery, with the diagnosis of MITRAL REGURGITATION  The various methods of treatment have been discussed with the patient and family. After consideration of risks, benefits and other options for treatment, the patient has consented to  Procedure(s): TRANSESOPHAGEAL ECHOCARDIOGRAM (TEE) (N/A) as a surgical intervention .  The patient's history has been reviewed, patient examined, no change in status, stable for surgery.  I have reviewed the patient's chart and labs.  Questions were answered to the patient's satisfaction.     [name]

## 2018-08-25 ENCOUNTER — Encounter (HOSPITAL_COMMUNITY): Payer: Self-pay | Admitting: Cardiovascular Disease

## 2018-08-25 ENCOUNTER — Telehealth: Payer: Self-pay | Admitting: Cardiology

## 2018-08-25 DIAGNOSIS — R042 Hemoptysis: Secondary | ICD-10-CM

## 2018-08-25 HISTORY — DX: Hemoptysis: R04.2

## 2018-08-25 NOTE — Telephone Encounter (Addendum)
Patient states that he has had an ongoing cough for over a week.  This morning after he woke up he had a coughing spell, and noticed he had dark phlegm.  The next time he coughed he had the same.  He states that the blood is dark brown and it is always when he has a productive cough.   He has not noticed any of this prior to TEE yesterday.  He is wondering if the blood is caused by TEE.    Please advise

## 2018-08-25 NOTE — Telephone Encounter (Signed)
Patient aware that he should report to [facility] today to have chest xray regarding coughing blood.   Patient aware of plan and agreed to report to [facility] to have chest xray.

## 2018-08-25 NOTE — Telephone Encounter (Signed)
Possible, lets get a CXR

## 2018-08-25 NOTE — Addendum Note (Signed)
Addended by: Lamona Curl on: 08/25/2018 11:28 AM   Modules accepted: Orders

## 2018-08-25 NOTE — Telephone Encounter (Signed)
Had a procedure done yesterday where they "put a light down his throat and took pictures of his heart"? and now he's spitting up blood

## 2018-09-04 NOTE — Progress Notes (Signed)
Cardiology Office Note:    Date:  09/05/2018   ID:  Alejandro Burnett, DOB 21-Aug-1951, MRN [id number]  PCP:  Ignacia PalmaBeck, Mark C., MD  Cardiologist:  Norman HerrlichBrian , MD    Referring MD: Ignacia PalmaBeck, Mark C., MD    ASSESSMENT:    1. Mitral valve prolapse   2. Nonrheumatic mitral valve regurgitation   3. Dyslipidemia    PLAN:    In order of problems listed above:  1. We had nice opportunity sit down the office that showed him his TEE images and reviewed with him the nature of severe mitral regurgitation especially in the setting of left ventricular dilation and symptoms.  I advised him to be seen by CT surgery and consider intervention he would like to meet the surgeon ahead of time make an appointment with Dr. [place].  I think he do very well with surgical intervention. 2. Continue his statin   Next appointment: Depending on the results of CTS consultation   Medication Adjustments/Labs and Tests Ordered: Current medicines are reviewed at length with the patient today.  Concerns regarding medicines are outlined above.  No orders of the defined types were placed in this encounter.  No orders of the defined types were placed in this encounter.   Chief Complaint  Patient presents with  . Follow-up    after TEE    History of Present Illness:    Alejandro CraverRandall L Arcilla is a 68 y.o. male with a hx of MVP with symptomatic severe MR last seen 08/08/18. TTE Nov 2019 with normal LV  function  EF 60-65% LV ID ED 62 mm mildly dilated.  TEE 08/24/18: Alejandro Burnett  ECHO TEE (ENDO) W COLOR AND SPECT   Patient name: Alejandro CraverRandall L Staat  MRN: [id number]  Age: 68 y.o.  Sex: male  - Left ventricle: Systolic function was normal. The estimated   ejection fraction was in the range of 60% to 65%. Wall motion was   normal; there were no regional wall motion abnormalities. - Mitral valve: Mild diffuse thickening, consistent with myxomatous   proliferation. Moderate, late systolicprolapse, involving  the   middle scallop of the posterior leaflet. Moderate flail motion   involving the middle scallop (P2) of the posterior leaflet due to   rupture of one or more chords. There was moderate to severe   regurgitation directed eccentrically and toward the septum. There   is no evidence of systolic pulmonary flow reversal. - Left atrium: No evidence of thrombus in the atrial cavity or   appendage. - Atrial septum: No defect or patent foramen ovale was identified.   Compliance with diet, lifestyle and medications: Yes  He acknowledges he has severe mitral valve disease but is not sure that he is symptomatic or ready to have intervention.  I reviewed and his exercise intolerance evidence of ventricular dilation abnormal pathology of the valve with a flail segment P2 and severe mitral regurgitation.  He acknowledges this and wants to meet with CT surgery ahead of time. Past Medical History:  Diagnosis Date  . Asthma 11/04/2015  . Benign prostatic hyperplasia with urinary obstruction 04/17/2014  . BPH (benign prostatic hyperplasia) 11/04/2015  . Dyslipidemia 11/04/2015  . Gastroesophageal reflux disease 04/17/2014  . Hematuria 04/17/2014   Microscopic  . Hyperlipemia 04/17/2014  . Migraine headache 04/17/2014  . Migraines 11/04/2015  . Mitral regurgitation 11/04/2015  . Mitral valve prolapse 11/04/2015  . Nephrolithiasis 11/04/2015  . Non-rheumatic mitral regurgitation 10/19/2016   Moderate  .  Normal coronary arteries 10/10/2017    Past Surgical History:  Procedure Laterality Date  . APPENDECTOMY    . BACK SURGERY    . TEE WITHOUT CARDIOVERSION N/A 08/24/2018   Procedure: TRANSESOPHAGEAL ECHOCARDIOGRAM (TEE);  Surgeon: Thurmon Fairroitoru, Mihai, MD;  Location: [facility] ENDOSCOPY;  Service: Cardiovascular;  Laterality: N/A;  . TONSILLECTOMY      Current Medications: Current Meds  Medication Sig  . albuterol (PROVENTIL HFA;VENTOLIN HFA) 108 (90 Base) MCG/ACT inhaler Inhale 1-2 puffs into the lungs every 6 (six)  hours as needed for wheezing or shortness of breath.   Marland Kitchen. amoxicillin (AMOXIL) 500 MG capsule Take 4 capsules 45 minutes prior to dental procedures.  Marland Kitchen. esomeprazole (NEXIUM) 40 MG capsule Take 40 mg by mouth daily.   . finasteride (PROSCAR) 5 MG tablet Take 5 mg by mouth daily.   . fluticasone (FLONASE) 50 MCG/ACT nasal spray Place 1 spray into both nostrils daily as needed for allergies or rhinitis.  . fluticasone-salmeterol (ADVAIR HFA) 115-21 MCG/ACT inhaler Inhale 2 puffs into the lungs daily as needed (shortness of breath).   . montelukast (SINGULAIR) 10 MG tablet Take 10 mg by mouth daily as needed (allergies).   . simvastatin (ZOCOR) 40 MG tablet Take 40 mg by mouth daily.     Allergies:   Apple; Banana; Peanut-containing drug products; and Strawberry (diagnostic)   Social History   Socioeconomic History  . Marital status: Married    Spouse name: Not on file  . Number of children: Not on file  . Years of education: Not on file  . Highest education level: Not on file  Occupational History  . Not on file  Social Needs  . Financial resource strain: Not on file  . Food insecurity:    Worry: Not on file    Inability: Not on file  . Transportation needs:    Medical: Not on file    Non-medical: Not on file  Tobacco Use  . Smoking status: Former Smoker    Last attempt to quit: 1986    Years since quitting: 34.0  . Smokeless tobacco: Former Engineer, waterUser  Substance and Sexual Activity  . Alcohol use: Never    Frequency: Never  . Drug use: Never  . Sexual activity: Not on file  Lifestyle  . Physical activity:    Days per week: Not on file    Minutes per session: Not on file  . Stress: Not on file  Relationships  . Social connections:    Talks on phone: Not on file    Gets together: Not on file    Attends religious service: Not on file    Active member of club or organization: Not on file    Attends meetings of clubs or organizations: Not on file    Relationship status: Not on  file  Other Topics Concern  . Not on file  Social History Narrative  . Not on file     Family History: The patient's family history includes Cancer in his father; Hypertension in his mother. ROS:   Please see the history of present illness.    All other systems reviewed and are negative.  EKGs/Labs/Other Studies Reviewed:    The following studies were reviewed today:    Recent Labs: 08/17/2018: BUN 21; Creatinine, Ser 1.14; Hemoglobin 13.8; Platelets 142; Potassium 3.9; Sodium 140  Recent Lipid Panel No results found for: CHOL, TRIG, HDL, CHOLHDL, VLDL, LDLCALC, LDLDIRECT  Physical Exam:    VS:  BP 126/70 (BP Location: Right Arm,  Patient Position: Sitting, Cuff Size: Normal)   Pulse 84   Ht 6\' 1"  (1.854 m)   Wt 190 lb 6 oz (86.4 kg)   SpO2 94%   BMI 25.12 kg/m     Wt Readings from Last 3 Encounters:  09/05/18 190 lb 6 oz (86.4 kg)  08/08/18 190 lb 2 oz (86.2 kg)  05/15/18 195 lb 9.6 oz (88.7 kg)     GEN:  Well nourished, well developed in no acute distress HEENT: Normal NECK: No JVD; No carotid bruits LYMPHATICS: No lymphadenopathy CARDIAC: 3/6 murmur MR throughout the precordium into the axilla and posterior scapular area RRR, no rubs, gallops RESPIRATORY:  Clear to auscultation without rales, wheezing or rhonchi  ABDOMEN: Soft, non-tender, non-distended MUSCULOSKELETAL:  No edema; No deformity  SKIN: Warm and dry NEUROLOGIC:  Alert and oriented x 3 PSYCHIATRIC:  Normal affect    Signed, Norman Herrlich, MD  09/05/2018 2:10 PM    [facility]

## 2018-09-05 ENCOUNTER — Encounter: Payer: Self-pay | Admitting: Cardiology

## 2018-09-05 ENCOUNTER — Ambulatory Visit (INDEPENDENT_AMBULATORY_CARE_PROVIDER_SITE_OTHER): Payer: Medicare Other | Admitting: Cardiology

## 2018-09-05 VITALS — BP 126/70 | HR 84 | Ht 73.0 in | Wt 190.4 lb

## 2018-09-05 DIAGNOSIS — E785 Hyperlipidemia, unspecified: Secondary | ICD-10-CM | POA: Diagnosis not present

## 2018-09-05 DIAGNOSIS — I341 Nonrheumatic mitral (valve) prolapse: Secondary | ICD-10-CM

## 2018-09-05 DIAGNOSIS — I340 Nonrheumatic mitral (valve) insufficiency: Secondary | ICD-10-CM

## 2018-09-05 NOTE — Patient Instructions (Addendum)
Medication Instructions:  Your physician recommends that you continue on your current medications as directed. Please refer to the Current Medication list given to you today. If you need a refill on your cardiac medications before your next appointment, please call your pharmacy.   Lab work: NONE If you have labs (blood work) drawn today and your tests are completely normal, you will receive your results only by: Marland Kitchen MyChart Message (if you have MyChart) OR . A paper copy in the mail If you have any lab test that is abnormal or we need to change your treatment, we will call you to review the results.  Testing/Procedures: NONE  Follow-Up:  You have been referred to Dr Barry Dienes for cardiothoracic surgery.  Please contact our office if you have not been scheduled in 1 - 2 weeks.     At [facility], you and your health needs are our priority.  As part of our continuing mission to provide you with exceptional heart care, we have created designated Provider Care Teams.  These Care Teams include your primary Cardiologist (physician) and Advanced Practice Providers (APPs -  Physician Assistants and Nurse Practitioners) who all work together to provide you with the care you need, when you need it.  You will need a follow up appointment after cardiothoracic surgery with Dr Dulce Sellar.

## 2018-09-18 ENCOUNTER — Encounter: Payer: Self-pay | Admitting: Thoracic Surgery (Cardiothoracic Vascular Surgery)

## 2018-09-18 ENCOUNTER — Other Ambulatory Visit: Payer: Self-pay | Admitting: *Deleted

## 2018-09-18 ENCOUNTER — Institutional Professional Consult (permissible substitution) (INDEPENDENT_AMBULATORY_CARE_PROVIDER_SITE_OTHER): Payer: Medicare Other | Admitting: Thoracic Surgery (Cardiothoracic Vascular Surgery)

## 2018-09-18 ENCOUNTER — Other Ambulatory Visit: Payer: Self-pay

## 2018-09-18 VITALS — BP 122/74 | HR 80 | Resp 16 | Ht 74.0 in | Wt 185.0 lb

## 2018-09-18 DIAGNOSIS — I341 Nonrheumatic mitral (valve) prolapse: Secondary | ICD-10-CM

## 2018-09-18 DIAGNOSIS — I340 Nonrheumatic mitral (valve) insufficiency: Secondary | ICD-10-CM

## 2018-09-18 NOTE — H&P (View-Only) (Signed)
[address]       [name] [address]             [phone]     CARDIOTHORACIC SURGERY CONSULTATION REPORT  Referring Provider is Dulce Sellar Iline Oven, MD PCP is [name], MD  Chief Complaint  Patient presents with  . Mitral Regurgitation    Surgical eval, TEE 08/24/18, ECHO 06/27/18    HPI:  Patient is a 68 year old male referred for surgical consultation to discuss treatment options for management of mitral valve prolapse with severe mitral regurgitation.  Patient states that he has been told that he had a heart murmur on physical exam for at least the past 10 years.  He states that this was initially discovered at his primary care physician's office and his murmur was reportedly difficult to appreciate.  Over time the murmur has gotten louder.  In 2016 he presented with symptoms of atypical chest pain.  Echocardiogram at that time reportedly revealed mitral valve prolapse with moderate mitral regurgitation.  Diagnostic cardiac catheterization was performed and reportedly revealed normal coronary artery anatomy.  The patient has been followed intermittently ever since by Dr. Dulce Sellar.  Routine follow-up echocardiogram performed June 27, 2018 revealed normal left ventricular systolic function.  There was mitral valve prolapse with what was felt to be mild mitral regurgitation.  Recently the patient was started on oral Flomax because of benign prostatic hypertrophy.  Shortly after that he began to experience occasional dizzy spells and worsening symptoms of shortness of breath.  He reported these symptoms to Dr. Dulce Sellar when he was seen in follow-up on August 08, 2018.  Because of progressive symptoms and prominent holosystolic murmur noted on physical exam, TEE was recommended.  Patient underwent TEE on August 24, 2018 which revealed mitral valve prolapse with a flail segment involving a portion of the posterior leaflet and severe mitral regurgitation.   Cardiothoracic surgical consultation was requested.  Patient is married but his wife is severely debilitated from Alzheimer's type dementia and lives in a nursing home.  He has a close friend who spends time with who accompanies him to our office for his consultation visit today.  The patient has been retired for several years but remains remarkably active physically.  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  He has remained quite active physically all of his adult life.  He still performs vigorous physical exertion on a daily basis.  He only recently experienced symptoms of exertional shortness of breath with increased palpitations and 1 episode of dizziness.  He states that the symptoms have all improved since he quit taking Flomax.  He denies any history of resting shortness of breath, PND, orthopnea, or lower extremity edema.  He has never had any exertional chest pain or chest tightness.  Past Medical History:  Diagnosis Date  . Asthma 11/04/2015  . Benign prostatic hyperplasia with urinary obstruction 04/17/2014  . BPH (benign prostatic hyperplasia) 11/04/2015  . Dyslipidemia 11/04/2015  . Gastroesophageal reflux disease 04/17/2014  . Hematuria 04/17/2014   Microscopic  . Hyperlipemia 04/17/2014  . Migraine headache 04/17/2014  . Migraines 11/04/2015  . Mitral regurgitation 11/04/2015  . Mitral valve prolapse 11/04/2015  . Nephrolithiasis 11/04/2015  . Non-rheumatic mitral regurgitation 10/19/2016   Moderate  . Normal coronary arteries 10/10/2017    Past Surgical History:  Procedure Laterality Date  . APPENDECTOMY    .  BACK SURGERY    . TEE WITHOUT CARDIOVERSION N/A 08/24/2018   Procedure: TRANSESOPHAGEAL ECHOCARDIOGRAM (TEE);  Surgeon: Thurmon Fair, MD;  Location: [facility] ENDOSCOPY;  Service: Cardiovascular;  Laterality: N/A;  . TONSILLECTOMY      Family History  Problem Relation Age of Onset  . Hypertension Mother    . Cancer Father     Social History   Socioeconomic History  . Marital status: Married    Spouse name: Not on file  . Number of children: Not on file  . Years of education: Not on file  . Highest education level: Not on file  Occupational History  . Not on file  Social Needs  . Financial resource strain: Not on file  . Food insecurity:    Worry: Not on file    Inability: Not on file  . Transportation needs:    Medical: Not on file    Non-medical: Not on file  Tobacco Use  . Smoking status: Former Smoker    Last attempt to quit: 1986    Years since quitting: 34.0  . Smokeless tobacco: Former Engineer, water and Sexual Activity  . Alcohol use: Never    Frequency: Never  . Drug use: Never  . Sexual activity: Not on file  Lifestyle  . Physical activity:    Days per week: Not on file    Minutes per session: Not on file  . Stress: Not on file  Relationships  . Social connections:    Talks on phone: Not on file    Gets together: Not on file    Attends religious service: Not on file    Active member of club or organization: Not on file    Attends meetings of clubs or organizations: Not on file    Relationship status: Not on file  . Intimate partner violence:    Fear of current or ex partner: Not on file    Emotionally abused: Not on file    Physically abused: Not on file    Forced sexual activity: Not on file  Other Topics Concern  . Not on file  Social History Narrative  . Not on file    Current Outpatient Medications  Medication Sig Dispense Refill  . albuterol (PROVENTIL HFA;VENTOLIN HFA) 108 (90 Base) MCG/ACT inhaler Inhale 1-2 puffs into the lungs every 6 (six) hours as needed for wheezing or shortness of breath.     Marland Kitchen amoxicillin (AMOXIL) 500 MG capsule Take 4 capsules 45 minutes prior to dental procedures. 20 capsule 0  . esomeprazole (NEXIUM) 40 MG capsule Take 40 mg by mouth daily.     . finasteride (PROSCAR) 5 MG tablet Take 5 mg by mouth daily.     .  fluticasone (FLONASE) 50 MCG/ACT nasal spray Place 1 spray into both nostrils daily as needed for allergies or rhinitis.    . fluticasone-salmeterol (ADVAIR HFA) 115-21 MCG/ACT inhaler Inhale 2 puffs into the lungs daily as needed (shortness of breath).     . montelukast (SINGULAIR) 10 MG tablet Take 10 mg by mouth daily as needed (allergies).     . simvastatin (ZOCOR) 40 MG tablet Take 40 mg by mouth daily.     No current facility-administered medications for this visit.     Allergies  Allergen Reactions  . Apple     Migraines  . Banana     Migraines  . Peanut-Containing Drug Products     Migraines   . Strawberry (Diagnostic)     Migraines  Review of Systems:   General:  normal appetite, normal energy, no weight gain, no weight loss, no fever  Cardiac:  no chest pain with exertion, no chest pain at rest, +SOB with strenuous exertion, no resting SOB, no PND, no orthopnea, no palpitations, no arrhythmia, no atrial fibrillation, no LE edema, one single dizzy spell, no syncope  Respiratory:  no shortness of breath, no home oxygen, no productive cough, no dry cough, no bronchitis, no wheezing, no hemoptysis, no asthma, no pain with inspiration or cough, no sleep apnea, no CPAP at night  GI:   no difficulty swallowing, + reflux, no frequent heartburn, no hiatal hernia, no abdominal pain, no constipation, no diarrhea, no hematochezia, no hematemesis, no melena  GU:   no dysuria,  no frequency, no urinary tract infection, + hematuria, + enlarged prostate, no kidney stones, no kidney disease  Vascular:  no pain suggestive of claudication, no pain in feet, no leg cramps, + varicose veins, no DVT, no non-healing foot ulcer  Neuro:   no stroke, no TIA's, no seizures, no headaches, no temporary blindness one eye,  no slurred speech, no peripheral neuropathy, no chronic pain, no instability of gait, no memory/cognitive dysfunction  Musculoskeletal: no arthritis, no joint swelling, no myalgias,  no difficulty walking, normal mobility   Skin:   + rash, no itching, no skin infections, no pressure sores or ulcerations  Psych:   no anxiety, no depression, no nervousness, no unusual recent stress  Eyes:   no blurry vision, no floaters, no recent vision changes, + wears glasses or contacts  ENT:   no hearing loss, no loose or painful teeth, no dentures, last saw dentist December 2019  Hematologic:  no easy bruising, no abnormal bleeding, no clotting disorder, no frequent epistaxis  Endocrine:  no diabetes, does not check CBG's at home     Physical Exam:   BP 122/74 (BP Location: Right Arm, Patient Position: Sitting, Cuff Size: Large)   Pulse 80   Resp 16   Ht 6\' 2"  (1.88 m)   Wt 185 lb (83.9 kg)   SpO2 95% Comment: RA  BMI 23.75 kg/m   General:  Thin,  well-appearing  HEENT:  Unremarkable   Neck:   no JVD, no bruits, no adenopathy   Chest:   clear to auscultation, symmetrical breath sounds, no wheezes, no rhonchi   CV:   RRR, grade III/VI holosystolic murmur   Abdomen:  soft, non-tender, no masses   Extremities:  warm, well-perfused, pulses palpable, no LE edema  Rectal/GU  Deferred  Neuro:   Grossly non-focal and symmetrical throughout  Skin:   Clean and dry, no rashes, no breakdown   Diagnostic Tests:  Transthoracic Echocardiography  Patient:    Diontay, Vollbrecht MR #:       [id number] Study Date: 06/27/2018 Gender:     M Age:        47 Height:     185.4 cm Weight:     88.7 kg BSA:        2.15 m^2 Pt. Status: Room:   REFERRING    Ignacia Palma  ATTENDING    Norman Herrlich, MD  ORDERING     Norman Herrlich, MD  REFERRING    Norman Herrlich, MD  SONOGRAPHER  Lanae Crumbly, RDCS  PERFORMING   [facility] [place]  cc:  ------------------------------------------------------------------- LV EF: 60% -   65%  ------------------------------------------------------------------- Indications:      Mitral valve prolapse 424.0.  Mitral regurgitation  424.0.  -------------------------------------------------------------------  History:   PMH:   Palpitations.  Risk factors:  Dyslipidemia.  ------------------------------------------------------------------- Study Conclusions  - Left ventricle: The cavity size was normal. Wall thickness was   normal. Systolic function was normal. The estimated ejection   fraction was in the range of 60% to 65%. Wall motion was normal;   there were no regional wall motion abnormalities. - Aortic valve: There was trivial regurgitation. - Mitral valve: Prolapse. Posterior leaflet. There was mild   regurgitation. - Left atrium: The atrium was moderately dilated.  Impressions:  - 1. Left ventricular systolic function is preserved visually   estimated 60 to 65%.   2. Moderate left atrial enlargement   3. Prolapsed posterior leaflet of the mitral valve with eccentric   mild mitral regurgitation directed towards the anteroseptum.   4. Trace aortic regurgitation.  ------------------------------------------------------------------- Study data:  No prior study was available for comparison.  Study status:  Routine.  Procedure:  The patient reported no pain pre or post test. Transthoracic echocardiography. Image quality was adequate.  Study completion:  There were no complications. Transthoracic echocardiography.  M-mode, complete 2D, spectral Doppler, and color Doppler.  Birthdate:  Patient birthdate: [birth year].  Age:  Patient is 68 yr old.  Sex:  Gender: male. BMI: 25.8 kg/m^2.  Blood pressure:     118/78  Patient status: Outpatient.  Study date:  Study date: 06/27/2018. Study time: 10:11 AM.  Location:  Echo laboratory.  -------------------------------------------------------------------  ------------------------------------------------------------------- Left ventricle:  The cavity size was normal. Wall thickness was normal. Systolic function was normal. The estimated ejection fraction was  in the range of 60% to 65%. Wall motion was normal; there were no regional wall motion abnormalities.  ------------------------------------------------------------------- Aortic valve:   Trileaflet; normal thickness leaflets. Mobility was not restricted.  Doppler:  Transvalvular velocity was within the normal range. There was no stenosis. There was trivial regurgitation.  ------------------------------------------------------------------- Aorta:  Aortic root: The aortic root was normal in size.  ------------------------------------------------------------------- Mitral valve:  Mobility was not restricted. Prolapse. Posterior leaflet.  Doppler:  Transvalvular velocity was within the normal range. There was no evidence for stenosis. There was mild regurgitation.    Valve area by pressure half-time: 3.49 cm^2. Indexed valve area by pressure half-time: 1.63 cm^2/m^2.    Peak gradient (D): 5 mm Hg.  ------------------------------------------------------------------- Left atrium:  The atrium was moderately dilated.  ------------------------------------------------------------------- Right ventricle:  The cavity size was normal. Wall thickness was normal. Systolic function was normal.  ------------------------------------------------------------------- Pulmonic valve:    Doppler:  Transvalvular velocity was within the normal range. There was no evidence for stenosis.  ------------------------------------------------------------------- Tricuspid valve:   Structurally normal valve.    Doppler: Transvalvular velocity was within the normal range. There was no regurgitation.  ------------------------------------------------------------------- Pulmonary artery:   The main pulmonary artery was normal-sized. Systolic pressure was within the normal range.  ------------------------------------------------------------------- Right atrium:  The atrium was normal in  size.  ------------------------------------------------------------------- Pericardium:  There was no pericardial effusion.  ------------------------------------------------------------------- Systemic veins: Inferior vena cava: The vessel was normal in size.  ------------------------------------------------------------------- Measurements   Left ventricle                         Value          Reference  LV ID, ED, PLAX chordal        (H)     62    mm       43 - 52  LV ID, ES, PLAX chordal        (  H)     39    mm       23 - 38  LV fx shortening, PLAX chordal         37    %        >=29  LV PW thickness, ED                    9     mm       ----------  IVS/LV PW ratio, ED                    1              <=1.3  LV e&', lateral                         7.07  cm/s     ----------  LV E/e&', lateral                       16.12          ----------  LV e&', medial                          7.94  cm/s     ----------  LV E/e&', medial                        14.36          ----------  LV e&', average                         7.51  cm/s     ----------  LV E/e&', average                       15.19          ----------    Ventricular septum                     Value          Reference  IVS thickness, ED                      9     mm       ----------    LVOT                                   Value          Reference  LVOT peak velocity, S                  114   cm/s     ----------  LVOT mean velocity, S                  80.1  cm/s     ----------  LVOT VTI, S                            26.1  cm       ----------  LVOT peak gradient, S                  5     mm Hg    ----------  Aorta                                  Value          Reference  Aortic root ID, ED                     38    mm       ----------  Ascending aorta ID, A-P, S             39    mm       ----------    Left atrium                            Value          Reference  LA ID, A-P, ES                         47    mm        ----------  LA ID/bsa, A-P                         2.19  cm/m^2   <=2.2  LA volume, S                           113   ml       ----------  LA volume/bsa, S                       52.6  ml/m^2   ----------  LA volume, ES, 1-p A4C                 102   ml       ----------  LA volume/bsa, ES, 1-p A4C             47.5  ml/m^2   ----------  LA volume, ES, 1-p A2C                 119   ml       ----------  LA volume/bsa, ES, 1-p A2C             55.4  ml/m^2   ----------    Mitral valve                           Value          Reference  Mitral E-wave peak velocity            114   cm/s     ----------  Mitral A-wave peak velocity            79.1  cm/s     ----------  Mitral deceleration time               215   ms       150 - 230  Mitral pressure half-time              63    ms       ----------  Mitral peak gradient, D                5     mm Hg    ----------  Mitral E/A ratio, peak                 1.4            ----------  Mitral valve area, PHT, DP             3.49  cm^2     ----------  Mitral valve area/bsa, PHT, DP         1.63  cm^2/m^2 ----------    Pulmonary arteries                     Value          Reference  PA pressure, S, DP                     22    mm Hg    <=30    Tricuspid valve                        Value          Reference  Tricuspid regurg peak velocity         220   cm/s     ----------  Tricuspid peak RV-RA gradient          19    mm Hg    ----------    Right atrium                           Value          Reference  RA ID, S-I, ES, A4C            (H)     64.2  mm       34 - 49  RA area, ES, A4C                       19.5  cm^2     8.3 - 19.5  RA volume, ES, A/L                     50.6  ml       ----------  RA volume/bsa, ES, A/L                 23.6  ml/m^2   ----------    Systemic veins                         Value          Reference  Estimated CVP                          3     mm Hg    ----------    Right ventricle                        Value           Reference  TAPSE                                  25.8  mm       ----------  RV pressure, S, DP                     22  mm Hg    <=30  RV s&', lateral, S                      11.6  cm/s     ----------  Legend: (L)  and  (H)  mark values outside specified reference range.  ------------------------------------------------------------------- Prepared and Electronically Authenticated by  Belva Cromeajan Revankar, MD 2019-11-05T12:39:13   Transesophageal Echocardiography  Patient:    Latina CraverLoflin, Jaleel L MR #:       [id number] Study Date: 08/24/2018 Gender:     M Age:        67 Height: Weight: BSA: Pt. Status: Room:   ORDERING     Norman HerrlichBrian Munley, MD  REFERRING    Norman HerrlichBrian Munley, MD  SONOGRAPHER  Lavenia AtlasBrooke Strickland, RCS  ADMITTING    Thurmon FairMihai Croitoru, MD  ATTENDING    Thurmon FairMihai Croitoru, MD  PERFORMING   Thurmon FairMihai Croitoru, MD  cc:  ------------------------------------------------------------------- LV EF: 60% -   65%  ------------------------------------------------------------------- Indications:      Mitral regurgitation 424.0.  ------------------------------------------------------------------- History:   PMH:   Mitral valve disease.  ------------------------------------------------------------------- Study Conclusions  - Left ventricle: Systolic function was normal. The estimated   ejection fraction was in the range of 60% to 65%. Wall motion was   normal; there were no regional wall motion abnormalities. - Mitral valve: Mild diffuse thickening, consistent with myxomatous   proliferation. Moderate, late systolicprolapse, involving the   middle scallop of the posterior leaflet. Moderate flail motion   involving the middle scallop (P2) of the posterior leaflet due to   rupture of one or more chords. There was moderate to severe   regurgitation directed eccentrically and toward the septum. There   is no evidence of systolic pulmonary flow reversal. - Left atrium: No evidence of  thrombus in the atrial cavity or   appendage. - Atrial septum: No defect or patent foramen ovale was identified.  ------------------------------------------------------------------- Study data:   Study status:  Routine.  Consent:  The risks, benefits, and alternatives to the procedure were explained to the patient and informed consent was obtained.  Procedure:  The patient reported no pain pre or post test. Initial setup. The patient was brought to the laboratory. Surface ECG leads were monitored. Sedation. Conscious sedation was administered by cardiology staff. Transesophageal echocardiography. Topical anesthesia was obtained using viscous lidocaine. A transesophageal probe was inserted by the attending cardiologistwithout difficulty. Image quality was adequate.  Study completion:  The patient tolerated the procedure well. There were no complications.  Administered medications: Midazolam, 4mg , IV.  Fentanyl, 50mcg, IV.          Diagnostic transesophageal echocardiography.  2D and color Doppler. Birthdate:  Patient birthdate: 1951-03-18.  Age:  Patient is 68 yr old.  Sex:  Gender: male.  Blood pressure:     111/68  Patient status:  Outpatient.  Study date:  Study date: 08/24/2018. Study time: 11:48 AM.  Location:  Endoscopy.  -------------------------------------------------------------------  ------------------------------------------------------------------- Left ventricle:  Systolic function was normal. The estimated ejection fraction was in the range of 60% to 65%. Wall motion was normal; there were no regional wall motion abnormalities.  ------------------------------------------------------------------- Aortic valve:   Structurally normal valve. Trileaflet; normal thickness leaflets. Cusp separation was normal.  Doppler:  There was trivial regurgitation.  ------------------------------------------------------------------- Aorta:  There was no atheroma. There was no  evidence for dissection. Aortic root: The aortic root was not dilated. Ascending aorta: The ascending aorta was normal in size. Aortic arch: The aortic  arch was normal in size. Descending aorta: The descending aorta was normal in size.  ------------------------------------------------------------------- Mitral valve:   Mild diffuse thickening, consistent with myxomatous proliferation. Leaflet separation was normal.  Moderate, late systolicprolapse, involving the middle scallop of the posterior leaflet.  Moderate flail motion involving the middle scallop of the posterior leaflet due to rupture of one or more chords. The ruptured chord is located towards the medial part of the middle scallop (bordering P3).  Doppler:  There was moderate to severe regurgitation directed eccentrically and toward the septum.  ------------------------------------------------------------------- Left atrium:  The atrium was normal in size.  No evidence of thrombus in the atrial cavity or appendage. The appendage was morphologically a left appendage, multilobulated, and of normal size. Emptying velocity was normal.  ------------------------------------------------------------------- Atrial septum:  No defect or patent foramen ovale was identified.   ------------------------------------------------------------------- Right ventricle:  The cavity size was normal. Wall thickness was normal. Systolic function was normal.  ------------------------------------------------------------------- Pulmonic valve:    Structurally normal valve.   Cusp separation was normal.  No evidence of vegetation.  Doppler:  There was trivial regurgitation.  ------------------------------------------------------------------- Tricuspid valve:   Structurally normal valve.   Leaflet separation was normal.  Doppler:  There was no significant  regurgitation.  ------------------------------------------------------------------- Pulmonary artery:   The main pulmonary artery was normal-sized.  ------------------------------------------------------------------- Right atrium:  The atrium was normal in size.  No evidence of thrombus in the atrial cavity or appendage. The appendage was morphologically a right appendage.  ------------------------------------------------------------------- Pericardium:  There was no pericardial effusion.  ------------------------------------------------------------------- Measurements   LVOT                                     Value  LVOT ID, S                               22.7   mm  LVOT area                                4.05   cm^2  LVOT peak velocity, S                    89.15  cm/s  LVOT VTI, S                              15.74  cm  Stroke volume (SV), LVOT DP              63.7   ml    Mitral valve                             Value  Aliasing velocity, MR PISA               35.1   cm/s  Mitral regurg PISA radius                10     mm  Mitral maximal regurg velocity, PISA     459.3  cm/s  Mitral regurg VTI, PISA                  107.81 cm  Mitral ERO, PISA  0.48   cm^2  Mitral regurg volume, PISA               51.8   ml  Mitral regurg fraction, PISA             44.83  %  Legend: (L)  and  (H)  mark values outside specified reference range.  ------------------------------------------------------------------- Prepared and Electronically Authenticated by  Thurmon Fair, MD 2020-01-02T15:55:25   Impression:  Patient has mitral valve prolapse with stage D severe symptomatic primary mitral regurgitation.  He recently developed symptoms of exertional shortness of breath, decreased exercise tolerance, and a single dizzy spell following exertion.  He states that the symptoms have improved since he quit taking Flomax.  He has a long history of intermittent  atypical chest pain and palpitations.  I personally reviewed the patient's recent transthoracic and transesophageal echocardiograms.  Patient has mitral valve prolapse with an obvious flail segment involving a portion of the middle scallop of the posterior leaflet.  There is severe mitral regurgitation.  The jet of regurgitation is eccentric, which explains why it was underestimated on transthoracic echocardiogram.  Left ventricular systolic function remains normal.  I agree the patient would likely benefit from elective mitral valve repair.  Based upon review of the patient's TEE I feel there is a high likelihood that his valve should be repairable with very low associated operative risk.  Patient will likely be a good candidate for minimally invasive approach for surgery, although he will need to undergo diagnostic cardiac catheterization to rule out the development of significant coronary artery disease.   Plan:  The patient and his close friend Steward Drone were counseled at length regarding the indications, risks and potential benefits of mitral valve repair.  The rationale for elective surgery has been explained, including a comparison between surgery and continued medical therapy with close follow-up.  The likelihood of successful and durable valve repair has been discussed with particular reference to the findings of their recent echocardiogram.  Based upon these findings and previous experience, I have quoted them a greater than 95 percent likelihood of successful valve repair.  Alternative surgical approaches have been discussed including a comparison between conventional sternotomy and minimally-invasive techniques.  Expectations for the patient's postoperative convalescence have been discussed.  All of their questions have been answered.  The patient is interested in proceeding with surgery but wants to wait until the end of March for personal reasons.  During the interim we will make arrangements for  the patient undergo elective diagnostic cardiac catheterization.  The patient will also undergo CT angiography to evaluate the feasibility of peripheral arterial cannulation for surgery.  Patient will return to our office in approximately 4 weeks to review the results of these tests and make final plans for surgery.   I spent in excess of 90 minutes during the conduct of this office consultation and >50% of this time involved direct face-to-face encounter with the patient for counseling and/or coordination of their care.   Salvatore Decent. Cornelius Moras, MD 09/18/2018 3:17 PM

## 2018-09-18 NOTE — Progress Notes (Signed)
     [address]       [place], [address]             [phone]     CARDIOTHORACIC SURGERY CONSULTATION REPORT  Referring Provider is Munley, Brian J, MD PCP is Beck, Mark C., MD  Chief Complaint  Patient presents with  . Mitral Regurgitation    Surgical eval, TEE 08/24/18, ECHO 06/27/18    HPI:  Patient is a 67-year-old male referred for surgical consultation to discuss treatment options for management of mitral valve prolapse with severe mitral regurgitation.  Patient states that he has been told that he had a heart murmur on physical exam for at least the past 10 years.  He states that this was initially discovered at his primary care physician's office and his murmur was reportedly difficult to appreciate.  Over time the murmur has gotten louder.  In 2016 he presented with symptoms of atypical chest pain.  Echocardiogram at that time reportedly revealed mitral valve prolapse with moderate mitral regurgitation.  Diagnostic cardiac catheterization was performed and reportedly revealed normal coronary artery anatomy.  The patient has been followed intermittently ever since by Dr. Munley.  Routine follow-up echocardiogram performed June 27, 2018 revealed normal left ventricular systolic function.  There was mitral valve prolapse with what was felt to be mild mitral regurgitation.  Recently the patient was started on oral Flomax because of benign prostatic hypertrophy.  Shortly after that he began to experience occasional dizzy spells and worsening symptoms of shortness of breath.  He reported these symptoms to Dr. Munley when he was seen in follow-up on August 08, 2018.  Because of progressive symptoms and prominent holosystolic murmur noted on physical exam, TEE was recommended.  Patient underwent TEE on August 24, 2018 which revealed mitral valve prolapse with a flail segment involving a portion of the posterior leaflet and severe mitral regurgitation.   Cardiothoracic surgical consultation was requested.  Patient is married but his wife is severely debilitated from Alzheimer's type dementia and lives in a nursing home.  He has a close friend who spends time with who accompanies him to our office for his consultation visit today.  The patient has been retired for several years but remains remarkably active physically.  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  He has remained quite active physically all of his adult life.  He still performs vigorous physical exertion on a daily basis.  He only recently experienced symptoms of exertional shortness of breath with increased palpitations and 1 episode of dizziness.  He states that the symptoms have all improved since he quit taking Flomax.  He denies any history of resting shortness of breath, PND, orthopnea, or lower extremity edema.  He has never had any exertional chest pain or chest tightness.  Past Medical History:  Diagnosis Date  . Asthma 11/04/2015  . Benign prostatic hyperplasia with urinary obstruction 04/17/2014  . BPH (benign prostatic hyperplasia) 11/04/2015  . Dyslipidemia 11/04/2015  . Gastroesophageal reflux disease 04/17/2014  . Hematuria 04/17/2014   Microscopic  . Hyperlipemia 04/17/2014  . Migraine headache 04/17/2014  . Migraines 11/04/2015  . Mitral regurgitation 11/04/2015  . Mitral valve prolapse 11/04/2015  . Nephrolithiasis 11/04/2015  . Non-rheumatic mitral regurgitation 10/19/2016   Moderate  . Normal coronary arteries 10/10/2017    Past Surgical History:  Procedure Laterality Date  . APPENDECTOMY    .   BACK SURGERY    . TEE WITHOUT CARDIOVERSION N/A 08/24/2018   Procedure: TRANSESOPHAGEAL ECHOCARDIOGRAM (TEE);  Surgeon: Croitoru, Mihai, MD;  Location: [facility];  Service: Cardiovascular;  Laterality: N/A;  . TONSILLECTOMY      Family History  Problem Relation Age of Onset  . Hypertension Mother    . Cancer Father     Social History   Socioeconomic History  . Marital status: Married    Spouse name: Not on file  . Number of children: Not on file  . Years of education: Not on file  . Highest education level: Not on file  Occupational History  . Not on file  Social Needs  . Financial resource strain: Not on file  . Food insecurity:    Worry: Not on file    Inability: Not on file  . Transportation needs:    Medical: Not on file    Non-medical: Not on file  Tobacco Use  . Smoking status: Former Smoker    Last attempt to quit: 1986    Years since quitting: 34.0  . Smokeless tobacco: Former User  Substance and Sexual Activity  . Alcohol use: Never    Frequency: Never  . Drug use: Never  . Sexual activity: Not on file  Lifestyle  . Physical activity:    Days per week: Not on file    Minutes per session: Not on file  . Stress: Not on file  Relationships  . Social connections:    Talks on phone: Not on file    Gets together: Not on file    Attends religious service: Not on file    Active member of club or organization: Not on file    Attends meetings of clubs or organizations: Not on file    Relationship status: Not on file  . Intimate partner violence:    Fear of current or ex partner: Not on file    Emotionally abused: Not on file    Physically abused: Not on file    Forced sexual activity: Not on file  Other Topics Concern  . Not on file  Social History Narrative  . Not on file    Current Outpatient Medications  Medication Sig Dispense Refill  . albuterol (PROVENTIL HFA;VENTOLIN HFA) 108 (90 Base) MCG/ACT inhaler Inhale 1-2 puffs into the lungs every 6 (six) hours as needed for wheezing or shortness of breath.     . amoxicillin (AMOXIL) 500 MG capsule Take 4 capsules 45 minutes prior to dental procedures. 20 capsule 0  . esomeprazole (NEXIUM) 40 MG capsule Take 40 mg by mouth daily.     . finasteride (PROSCAR) 5 MG tablet Take 5 mg by mouth daily.     .  fluticasone (FLONASE) 50 MCG/ACT nasal spray Place 1 spray into both nostrils daily as needed for allergies or rhinitis.    . fluticasone-salmeterol (ADVAIR HFA) 115-21 MCG/ACT inhaler Inhale 2 puffs into the lungs daily as needed (shortness of breath).     . montelukast (SINGULAIR) 10 MG tablet Take 10 mg by mouth daily as needed (allergies).     . simvastatin (ZOCOR) 40 MG tablet Take 40 mg by mouth daily.     No current facility-administered medications for this visit.     Allergies  Allergen Reactions  . Apple     Migraines  . Banana     Migraines  . Peanut-Containing Drug Products     Migraines   . Strawberry (Diagnostic)     Migraines        Review of Systems:   General:  normal appetite, normal energy, no weight gain, no weight loss, no fever  Cardiac:  no chest pain with exertion, no chest pain at rest, +SOB with strenuous exertion, no resting SOB, no PND, no orthopnea, no palpitations, no arrhythmia, no atrial fibrillation, no LE edema, one single dizzy spell, no syncope  Respiratory:  no shortness of breath, no home oxygen, no productive cough, no dry cough, no bronchitis, no wheezing, no hemoptysis, no asthma, no pain with inspiration or cough, no sleep apnea, no CPAP at night  GI:   no difficulty swallowing, + reflux, no frequent heartburn, no hiatal hernia, no abdominal pain, no constipation, no diarrhea, no hematochezia, no hematemesis, no melena  GU:   no dysuria,  no frequency, no urinary tract infection, + hematuria, + enlarged prostate, no kidney stones, no kidney disease  Vascular:  no pain suggestive of claudication, no pain in feet, no leg cramps, + varicose veins, no DVT, no non-healing foot ulcer  Neuro:   no stroke, no TIA's, no seizures, no headaches, no temporary blindness one eye,  no slurred speech, no peripheral neuropathy, no chronic pain, no instability of gait, no memory/cognitive dysfunction  Musculoskeletal: no arthritis, no joint swelling, no myalgias,  no difficulty walking, normal mobility   Skin:   + rash, no itching, no skin infections, no pressure sores or ulcerations  Psych:   no anxiety, no depression, no nervousness, no unusual recent stress  Eyes:   no blurry vision, no floaters, no recent vision changes, + wears glasses or contacts  ENT:   no hearing loss, no loose or painful teeth, no dentures, last saw dentist December 2019  Hematologic:  no easy bruising, no abnormal bleeding, no clotting disorder, no frequent epistaxis  Endocrine:  no diabetes, does not check CBG's at home     Physical Exam:   BP 122/74 (BP Location: Right Arm, Patient Position: Sitting, Cuff Size: Large)   Pulse 80   Resp 16   Ht 6' 2" (1.88 m)   Wt 185 lb (83.9 kg)   SpO2 95% Comment: RA  BMI 23.75 kg/m   General:  Thin,  well-appearing  HEENT:  Unremarkable   Neck:   no JVD, no bruits, no adenopathy   Chest:   clear to auscultation, symmetrical breath sounds, no wheezes, no rhonchi   CV:   RRR, grade III/VI holosystolic murmur   Abdomen:  soft, non-tender, no masses   Extremities:  warm, well-perfused, pulses palpable, no LE edema  Rectal/GU  Deferred  Neuro:   Grossly non-focal and symmetrical throughout  Skin:   Clean and dry, no rashes, no breakdown   Diagnostic Tests:  Transthoracic Echocardiography  Patient:    Burnett, Alejandro L MR #:       [id number] Study Date: 06/27/2018 Gender:     M Age:        67 Height:     185.4 cm Weight:     88.7 kg BSA:        2.15 m^2 Pt. Status: Room:   REFERRING    Beck, Mark C.  ATTENDING    Brian Munley, MD  ORDERING     Brian Munley, MD  REFERRING    Brian Munley, MD  SONOGRAPHER  Jimmy Reel, RDCS  PERFORMING   [facility] [place]  cc:  ------------------------------------------------------------------- LV EF: 60% -   65%  ------------------------------------------------------------------- Indications:      Mitral valve prolapse 424.0.  Mitral regurgitation  424.0.  -------------------------------------------------------------------   History:   PMH:   Palpitations.  Risk factors:  Dyslipidemia.  ------------------------------------------------------------------- Study Conclusions  - Left ventricle: The cavity size was normal. Wall thickness was   normal. Systolic function was normal. The estimated ejection   fraction was in the range of 60% to 65%. Wall motion was normal;   there were no regional wall motion abnormalities. - Aortic valve: There was trivial regurgitation. - Mitral valve: Prolapse. Posterior leaflet. There was mild   regurgitation. - Left atrium: The atrium was moderately dilated.  Impressions:  - 1. Left ventricular systolic function is preserved visually   estimated 60 to 65%.   2. Moderate left atrial enlargement   3. Prolapsed posterior leaflet of the mitral valve with eccentric   mild mitral regurgitation directed towards the anteroseptum.   4. Trace aortic regurgitation.  ------------------------------------------------------------------- Study data:  No prior study was available for comparison.  Study status:  Routine.  Procedure:  The patient reported no pain pre or post test. Transthoracic echocardiography. Image quality was adequate.  Study completion:  There were no complications. Transthoracic echocardiography.  M-mode, complete 2D, spectral Doppler, and color Doppler.  Birthdate:  Patient birthdate: 05/08/1951.  Age:  Patient is 67 yr old.  Sex:  Gender: male. BMI: 25.8 kg/m^2.  Blood pressure:     118/78  Patient status: Outpatient.  Study date:  Study date: 06/27/2018. Study time: 10:11 AM.  Location:  Echo laboratory.  -------------------------------------------------------------------  ------------------------------------------------------------------- Left ventricle:  The cavity size was normal. Wall thickness was normal. Systolic function was normal. The estimated ejection fraction was  in the range of 60% to 65%. Wall motion was normal; there were no regional wall motion abnormalities.  ------------------------------------------------------------------- Aortic valve:   Trileaflet; normal thickness leaflets. Mobility was not restricted.  Doppler:  Transvalvular velocity was within the normal range. There was no stenosis. There was trivial regurgitation.  ------------------------------------------------------------------- Aorta:  Aortic root: The aortic root was normal in size.  ------------------------------------------------------------------- Mitral valve:  Mobility was not restricted. Prolapse. Posterior leaflet.  Doppler:  Transvalvular velocity was within the normal range. There was no evidence for stenosis. There was mild regurgitation.    Valve area by pressure half-time: 3.49 cm^2. Indexed valve area by pressure half-time: 1.63 cm^2/m^2.    Peak gradient (D): 5 mm Hg.  ------------------------------------------------------------------- Left atrium:  The atrium was moderately dilated.  ------------------------------------------------------------------- Right ventricle:  The cavity size was normal. Wall thickness was normal. Systolic function was normal.  ------------------------------------------------------------------- Pulmonic valve:    Doppler:  Transvalvular velocity was within the normal range. There was no evidence for stenosis.  ------------------------------------------------------------------- Tricuspid valve:   Structurally normal valve.    Doppler: Transvalvular velocity was within the normal range. There was no regurgitation.  ------------------------------------------------------------------- Pulmonary artery:   The main pulmonary artery was normal-sized. Systolic pressure was within the normal range.  ------------------------------------------------------------------- Right atrium:  The atrium was normal in  size.  ------------------------------------------------------------------- Pericardium:  There was no pericardial effusion.  ------------------------------------------------------------------- Systemic veins: Inferior vena cava: The vessel was normal in size.  ------------------------------------------------------------------- Measurements   Left ventricle                         Value          Reference  LV ID, ED, PLAX chordal        (H)     62    mm       43 - 52  LV ID, ES, PLAX chordal        (  H)     39    mm       23 - 38  LV fx shortening, PLAX chordal         37    %        >=29  LV PW thickness, ED                    9     mm       ----------  IVS/LV PW ratio, ED                    1              <=1.3  LV e&', lateral                         7.07  cm/s     ----------  LV E/e&', lateral                       16.12          ----------  LV e&', medial                          7.94  cm/s     ----------  LV E/e&', medial                        14.36          ----------  LV e&', average                         7.51  cm/s     ----------  LV E/e&', average                       15.19          ----------    Ventricular septum                     Value          Reference  IVS thickness, ED                      9     mm       ----------    LVOT                                   Value          Reference  LVOT peak velocity, S                  114   cm/s     ----------  LVOT mean velocity, S                  80.1  cm/s     ----------  LVOT VTI, S                            26.1  cm       ----------  LVOT peak gradient, S                  5     mm Hg    ----------      Aorta                                  Value          Reference  Aortic root ID, ED                     38    mm       ----------  Ascending aorta ID, A-P, S             39    mm       ----------    Left atrium                            Value          Reference  LA ID, A-P, ES                         47    mm        ----------  LA ID/bsa, A-P                         2.19  cm/m^2   <=2.2  LA volume, S                           113   ml       ----------  LA volume/bsa, S                       52.6  ml/m^2   ----------  LA volume, ES, 1-p A4C                 102   ml       ----------  LA volume/bsa, ES, 1-p A4C             47.5  ml/m^2   ----------  LA volume, ES, 1-p A2C                 119   ml       ----------  LA volume/bsa, ES, 1-p A2C             55.4  ml/m^2   ----------    Mitral valve                           Value          Reference  Mitral E-wave peak velocity            114   cm/s     ----------  Mitral A-wave peak velocity            79.1  cm/s     ----------  Mitral deceleration time               215   ms       150 - 230  Mitral pressure half-time              63    ms       ----------  Mitral peak gradient, D                5     mm Hg    ----------    Mitral E/A ratio, peak                 1.4            ----------  Mitral valve area, PHT, DP             3.49  cm^2     ----------  Mitral valve area/bsa, PHT, DP         1.63  cm^2/m^2 ----------    Pulmonary arteries                     Value          Reference  PA pressure, S, DP                     22    mm Hg    <=30    Tricuspid valve                        Value          Reference  Tricuspid regurg peak velocity         220   cm/s     ----------  Tricuspid peak RV-RA gradient          19    mm Hg    ----------    Right atrium                           Value          Reference  RA ID, S-I, ES, A4C            (H)     64.2  mm       34 - 49  RA area, ES, A4C                       19.5  cm^2     8.3 - 19.5  RA volume, ES, A/L                     50.6  ml       ----------  RA volume/bsa, ES, A/L                 23.6  ml/m^2   ----------    Systemic veins                         Value          Reference  Estimated CVP                          3     mm Hg    ----------    Right ventricle                        Value           Reference  TAPSE                                  25.8  mm       ----------  RV pressure, S, DP                     22      mm Hg    <=30  RV s&', lateral, S                      11.6  cm/s     ----------  Legend: (L)  and  (H)  mark values outside specified reference range.  ------------------------------------------------------------------- Prepared and Electronically Authenticated by  Rajan Revankar, MD 2019-11-05T12:39:13   Transesophageal Echocardiography  Patient:    Burnett, Alejandro L MR #:       [id number] Study Date: 08/24/2018 Gender:     M Age:        67 Height: Weight: BSA: Pt. Status: Room:   ORDERING     Brian Munley, MD  REFERRING    Brian Munley, MD  SONOGRAPHER  Brooke Strickland, RCS  ADMITTING    Mihai Croitoru, MD  ATTENDING    Mihai Croitoru, MD  PERFORMING   Mihai Croitoru, MD  cc:  ------------------------------------------------------------------- LV EF: 60% -   65%  ------------------------------------------------------------------- Indications:      Mitral regurgitation 424.0.  ------------------------------------------------------------------- History:   PMH:   Mitral valve disease.  ------------------------------------------------------------------- Study Conclusions  - Left ventricle: Systolic function was normal. The estimated   ejection fraction was in the range of 60% to 65%. Wall motion was   normal; there were no regional wall motion abnormalities. - Mitral valve: Mild diffuse thickening, consistent with myxomatous   proliferation. Moderate, late systolicprolapse, involving the   middle scallop of the posterior leaflet. Moderate flail motion   involving the middle scallop (P2) of the posterior leaflet due to   rupture of one or more chords. There was moderate to severe   regurgitation directed eccentrically and toward the septum. There   is no evidence of systolic pulmonary flow reversal. - Left atrium: No evidence of  thrombus in the atrial cavity or   appendage. - Atrial septum: No defect or patent foramen ovale was identified.  ------------------------------------------------------------------- Study data:   Study status:  Routine.  Consent:  The risks, benefits, and alternatives to the procedure were explained to the patient and informed consent was obtained.  Procedure:  The patient reported no pain pre or post test. Initial setup. The patient was brought to the laboratory. Surface ECG leads were monitored. Sedation. Conscious sedation was administered by cardiology staff. Transesophageal echocardiography. Topical anesthesia was obtained using viscous lidocaine. A transesophageal probe was inserted by the attending cardiologistwithout difficulty. Image quality was adequate.  Study completion:  The patient tolerated the procedure well. There were no complications.  Administered medications: Midazolam, 4mg, IV.  Fentanyl, 50mcg, IV.          Diagnostic transesophageal echocardiography.  2D and color Doppler. Birthdate:  Patient birthdate: 06/28/1951.  Age:  Patient is 67 yr old.  Sex:  Gender: male.  Blood pressure:     111/68  Patient status:  Outpatient.  Study date:  Study date: 08/24/2018. Study time: 11:48 AM.  Location:  Endoscopy.  -------------------------------------------------------------------  ------------------------------------------------------------------- Left ventricle:  Systolic function was normal. The estimated ejection fraction was in the range of 60% to 65%. Wall motion was normal; there were no regional wall motion abnormalities.  ------------------------------------------------------------------- Aortic valve:   Structurally normal valve. Trileaflet; normal thickness leaflets. Cusp separation was normal.  Doppler:  There was trivial regurgitation.  ------------------------------------------------------------------- Aorta:  There was no atheroma. There was no  evidence for dissection. Aortic root: The aortic root was not dilated. Ascending aorta: The ascending aorta was normal in size. Aortic arch: The aortic   arch was normal in size. Descending aorta: The descending aorta was normal in size.  ------------------------------------------------------------------- Mitral valve:   Mild diffuse thickening, consistent with myxomatous proliferation. Leaflet separation was normal.  Moderate, late systolicprolapse, involving the middle scallop of the posterior leaflet.  Moderate flail motion involving the middle scallop of the posterior leaflet due to rupture of one or more chords. The ruptured chord is located towards the medial part of the middle scallop (bordering P3).  Doppler:  There was moderate to severe regurgitation directed eccentrically and toward the septum.  ------------------------------------------------------------------- Left atrium:  The atrium was normal in size.  No evidence of thrombus in the atrial cavity or appendage. The appendage was morphologically a left appendage, multilobulated, and of normal size. Emptying velocity was normal.  ------------------------------------------------------------------- Atrial septum:  No defect or patent foramen ovale was identified.   ------------------------------------------------------------------- Right ventricle:  The cavity size was normal. Wall thickness was normal. Systolic function was normal.  ------------------------------------------------------------------- Pulmonic valve:    Structurally normal valve.   Cusp separation was normal.  No evidence of vegetation.  Doppler:  There was trivial regurgitation.  ------------------------------------------------------------------- Tricuspid valve:   Structurally normal valve.   Leaflet separation was normal.  Doppler:  There was no significant  regurgitation.  ------------------------------------------------------------------- Pulmonary artery:   The main pulmonary artery was normal-sized.  ------------------------------------------------------------------- Right atrium:  The atrium was normal in size.  No evidence of thrombus in the atrial cavity or appendage. The appendage was morphologically a right appendage.  ------------------------------------------------------------------- Pericardium:  There was no pericardial effusion.  ------------------------------------------------------------------- Measurements   LVOT                                     Value  LVOT ID, S                               22.7   mm  LVOT area                                4.05   cm^2  LVOT peak velocity, S                    89.15  cm/s  LVOT VTI, S                              15.74  cm  Stroke volume (SV), LVOT DP              63.7   ml    Mitral valve                             Value  Aliasing velocity, MR PISA               35.1   cm/s  Mitral regurg PISA radius                10     mm  Mitral maximal regurg velocity, PISA     459.3  cm/s  Mitral regurg VTI, PISA                  107.81 cm  Mitral ERO, PISA                           0.48   cm^2  Mitral regurg volume, PISA               51.8   ml  Mitral regurg fraction, PISA             44.83  %  Legend: (L)  and  (H)  mark values outside specified reference range.  ------------------------------------------------------------------- Prepared and Electronically Authenticated by  Mihai Croitoru, MD 2020-01-02T15:55:25   Impression:  Patient has mitral valve prolapse with stage D severe symptomatic primary mitral regurgitation.  He recently developed symptoms of exertional shortness of breath, decreased exercise tolerance, and a single dizzy spell following exertion.  He states that the symptoms have improved since he quit taking Flomax.  He has a long history of intermittent  atypical chest pain and palpitations.  I personally reviewed the patient's recent transthoracic and transesophageal echocardiograms.  Patient has mitral valve prolapse with an obvious flail segment involving a portion of the middle scallop of the posterior leaflet.  There is severe mitral regurgitation.  The jet of regurgitation is eccentric, which explains why it was underestimated on transthoracic echocardiogram.  Left ventricular systolic function remains normal.  I agree the patient would likely benefit from elective mitral valve repair.  Based upon review of the patient's TEE I feel there is a high likelihood that his valve should be repairable with very low associated operative risk.  Patient will likely be a good candidate for minimally invasive approach for surgery, although he will need to undergo diagnostic cardiac catheterization to rule out the development of significant coronary artery disease.   Plan:  The patient and his close friend Alejandro Burnett were counseled at length regarding the indications, risks and potential benefits of mitral valve repair.  The rationale for elective surgery has been explained, including a comparison between surgery and continued medical therapy with close follow-up.  The likelihood of successful and durable valve repair has been discussed with particular reference to the findings of their recent echocardiogram.  Based upon these findings and previous experience, I have quoted them a greater than 95 percent likelihood of successful valve repair.  Alternative surgical approaches have been discussed including a comparison between conventional sternotomy and minimally-invasive techniques.  Expectations for the patient's postoperative convalescence have been discussed.  All of their questions have been answered.  The patient is interested in proceeding with surgery but wants to wait until the end of March for personal reasons.  During the interim we will make arrangements for  the patient undergo elective diagnostic cardiac catheterization.  The patient will also undergo CT angiography to evaluate the feasibility of peripheral arterial cannulation for surgery.  Patient will return to our office in approximately 4 weeks to review the results of these tests and make final plans for surgery.   I spent in excess of 90 minutes during the conduct of this office consultation and >50% of this time involved direct face-to-face encounter with the patient for counseling and/or coordination of their care.   Alejandro Andujo H. Adely Facer, MD 09/18/2018 3:17 PM  

## 2018-09-18 NOTE — Patient Instructions (Signed)
Continue all previous medications without any changes at this time  

## 2018-09-21 ENCOUNTER — Telehealth: Payer: Self-pay

## 2018-09-21 DIAGNOSIS — I340 Nonrheumatic mitral (valve) insufficiency: Secondary | ICD-10-CM

## 2018-09-21 NOTE — Telephone Encounter (Signed)
Follow up  ° ° °Patient is returning your call. °

## 2018-09-21 NOTE — Telephone Encounter (Signed)
Spoke with Alejandro Burnett who states 2/17 is a good day for his catheterization.  Called and scheduled him for 2/17 at noon.  Called patient to confirm date/time and review instructions. Left message that he will be called tomorrow.

## 2018-09-21 NOTE — Telephone Encounter (Signed)
-----   Message from Iona Coach, RN sent at 09/18/2018  7:15 PM EST ----- Regarding: Arrange Cath for Dr Cornelius Moras on Elective Case [place],  Can you set this pt up for a cath in preparation for elective MVR with Dr Cornelius Moras? He said any time during the next few weeks with [facility].  Thanks, Lauren  ----- Message ----- From: Purcell Nails, MD Sent: 09/18/2018   4:29 PM EST To: Iona Coach, RN, Tonny Bollman, MD  This patient needs cath prior to elective mitral valve repair.  Any time during next few weeks is fine.  He did have cath in 2016 in [facility] and according to him they were unable to get up from radial approach - had to go femoral.  Thx

## 2018-09-22 NOTE — Telephone Encounter (Signed)
Confirmed cath 2/17 with arrival time of 1000. He will come Tuesday prior to his CT scans for pre-procedure blood work and to pick up cath instruction letter.  He states his phone has been broken and he has not received a phone call about CTs. Confirmed date, time, and instructions for scans 2/4. He understands to call if he has questions prior to the appointment.

## 2018-09-26 ENCOUNTER — Ambulatory Visit
Admission: RE | Admit: 2018-09-26 | Discharge: 2018-09-26 | Disposition: A | Discharge status: Home / Self Care | Payer: Medicare Other | Source: Ambulatory Visit | Attending: Thoracic Surgery (Cardiothoracic Vascular Surgery) | Admitting: Thoracic Surgery (Cardiothoracic Vascular Surgery)

## 2018-09-26 ENCOUNTER — Other Ambulatory Visit: Payer: Medicare Other

## 2018-09-26 DIAGNOSIS — I340 Nonrheumatic mitral (valve) insufficiency: Secondary | ICD-10-CM

## 2018-09-26 MED ORDER — IOPAMIDOL (ISOVUE-370) INJECTION 76%
75.0000 mL | Freq: Once | INTRAVENOUS | Status: AC | PRN
  Administered 2018-09-26: 75 mL via INTRAVENOUS

## 2018-09-27 LAB — BASIC METABOLIC PANEL
BUN/Creatinine Ratio: 20 (ref 10–24)
BUN: 20 mg/dL (ref 8–27)
CO2: 21 mmol/L (ref 20–29)
Calcium: 9.4 mg/dL (ref 8.6–10.2)
Chloride: 104 mmol/L (ref 96–106)
Creatinine, Ser: 0.99 mg/dL (ref 0.76–1.27)
GFR calc Af Amer: 92 mL/min/{1.73_m2} (ref >59)
GFR calc non Af Amer: 80 mL/min/{1.73_m2} (ref >59)
Glucose: 92 mg/dL (ref 65–99)
Potassium: 4.2 mmol/L (ref 3.5–5.2)
SODIUM: 140 mmol/L (ref 134–144)

## 2018-09-27 LAB — CBC WITH DIFFERENTIAL/PLATELET
BASOS: 1 %
Basophils Absolute: 0 10*3/uL (ref 0.0–0.2)
EOS (ABSOLUTE): 0.4 10*3/uL (ref 0.0–0.4)
Eos: 5 %
Hematocrit: 41.1 % (ref 37.5–51.0)
Hemoglobin: 14.1 g/dL (ref 13.0–17.7)
Immature Grans (Abs): 0 10*3/uL (ref 0.0–0.1)
Immature Granulocytes: 0 %
Lymphocytes Absolute: 1.5 10*3/uL (ref 0.7–3.1)
Lymphs: 17 %
MCH: 29.7 pg (ref 26.6–33.0)
MCHC: 34.3 g/dL (ref 31.5–35.7)
MCV: 87 fL (ref 79–97)
Monocytes Absolute: 0.7 10*3/uL (ref 0.1–0.9)
Monocytes: 8 %
Neutrophils Absolute: 5.9 10*3/uL (ref 1.4–7.0)
Neutrophils: 69 %
Platelets: 170 10*3/uL (ref 150–450)
RBC: 4.75 x10E6/uL (ref 4.14–5.80)
RDW: 12.2 % (ref 11.6–15.4)
WBC: 8.6 10*3/uL (ref 3.4–10.8)

## 2018-10-05 ENCOUNTER — Telehealth: Payer: Self-pay | Admitting: *Deleted

## 2018-10-05 NOTE — Telephone Encounter (Signed)
Pt contacted [facility]catheterization scheduled [facility] for: Monday October 09, 2018 12 noon Ver[facility] at: 10 AM  No solid food after midnight prior to cath, clear liquids until 5 AM day of procedure. Contrast allergy: no Verified no diabetes medications.  AM meds can be  taken pre-cath with sip of water including: ASA 81 mg  Confirmed patient has responsible person to drive home post procedure and observe 24 hours after arriving home: yes

## 2018-10-09 ENCOUNTER — Ambulatory Visit (HOSPITAL_COMMUNITY)
Admission: RE | Admit: 2018-10-09 | Discharge: 2018-10-09 | Disposition: A | Discharge status: Home / Self Care | Payer: Medicare Other | Attending: Cardiovascular Disease | Admitting: Cardiovascular Disease

## 2018-10-09 ENCOUNTER — Other Ambulatory Visit: Payer: Self-pay

## 2018-10-09 ENCOUNTER — Encounter (HOSPITAL_COMMUNITY)
Admission: RE | Disposition: A | Discharge status: Home / Self Care | Payer: Self-pay | Source: Home / Self Care | Attending: Cardiovascular Disease

## 2018-10-09 DIAGNOSIS — E785 Hyperlipidemia, unspecified: Secondary | ICD-10-CM | POA: Diagnosis not present

## 2018-10-09 DIAGNOSIS — R0789 Other chest pain: Secondary | ICD-10-CM | POA: Insufficient documentation

## 2018-10-09 DIAGNOSIS — I051 Rheumatic mitral insufficiency: Secondary | ICD-10-CM | POA: Insufficient documentation

## 2018-10-09 DIAGNOSIS — Z79899 Other long term (current) drug therapy: Secondary | ICD-10-CM | POA: Insufficient documentation

## 2018-10-09 DIAGNOSIS — Z8249 Family history of ischemic heart disease and other diseases of the circulatory system: Secondary | ICD-10-CM | POA: Diagnosis not present

## 2018-10-09 DIAGNOSIS — K219 Gastro-esophageal reflux disease without esophagitis: Secondary | ICD-10-CM | POA: Diagnosis not present

## 2018-10-09 DIAGNOSIS — J45909 Unspecified asthma, uncomplicated: Secondary | ICD-10-CM | POA: Insufficient documentation

## 2018-10-09 DIAGNOSIS — I340 Nonrheumatic mitral (valve) insufficiency: Secondary | ICD-10-CM | POA: Diagnosis not present

## 2018-10-09 DIAGNOSIS — Z87891 Personal history of nicotine dependence: Secondary | ICD-10-CM | POA: Insufficient documentation

## 2018-10-09 DIAGNOSIS — N400 Enlarged prostate without lower urinary tract symptoms: Secondary | ICD-10-CM | POA: Diagnosis not present

## 2018-10-09 HISTORY — PX: RIGHT/LEFT HEART CATH AND CORONARY ANGIOGRAPHY: CATH118266

## 2018-10-09 LAB — POCT I-STAT 7, (LYTES, BLD GAS, ICA,H+H)
Acid-base deficit: 3 mmol/L — ABNORMAL HIGH (ref 0.0–2.0)
Bicarbonate: 22.3 mmol/L (ref 20.0–28.0)
Calcium, Ion: 1.17 mmol/L (ref 1.15–1.40)
HEMATOCRIT: 40 % (ref 39.0–52.0)
Hemoglobin: 13.6 g/dL (ref 13.0–17.0)
O2 Saturation: 97 %
PCO2 ART: 41.5 mmHg (ref 32.0–48.0)
POTASSIUM: 3.5 mmol/L (ref 3.5–5.1)
Sodium: 136 mmol/L (ref 135–145)
TCO2: 24 mmol/L (ref 22–32)
pH, Arterial: 7.337 — ABNORMAL LOW (ref 7.350–7.450)
pO2, Arterial: 95 mmHg (ref 83.0–108.0)

## 2018-10-09 LAB — POCT I-STAT EG7
Bicarbonate: 25.9 mmol/L (ref 20.0–28.0)
Calcium, Ion: 1.21 mmol/L (ref 1.15–1.40)
HCT: 41 % (ref 39.0–52.0)
Hemoglobin: 13.9 g/dL (ref 13.0–17.0)
O2 Saturation: 63 %
Potassium: 3.6 mmol/L (ref 3.5–5.1)
Sodium: 141 mmol/L (ref 135–145)
TCO2: 27 mmol/L (ref 22–32)
pCO2, Ven: 45.1 mmHg (ref 44.0–60.0)
pH, Ven: 7.367 (ref 7.250–7.430)
pO2, Ven: 34 mmHg (ref 32.0–45.0)

## 2018-10-09 SURGERY — RIGHT/LEFT HEART CATH AND CORONARY ANGIOGRAPHY
Anesthesia: LOCAL

## 2018-10-09 MED ORDER — SODIUM CHLORIDE 0.9% FLUSH: 3.0000 mL | Freq: Two times a day (BID) | INTRAVENOUS | Status: DC

## 2018-10-09 MED ORDER — ONDANSETRON HCL 4 MG/2ML IJ SOLN: 4.0000 mg | Freq: Four times a day (QID) | INTRAMUSCULAR | Status: DC | PRN

## 2018-10-09 MED ORDER — HEPARIN SODIUM (PORCINE) 1000 UNIT/ML IJ SOLN
INTRAMUSCULAR | Status: DC | PRN
  Administered 2018-10-09: 4000 [IU] via INTRAVENOUS

## 2018-10-09 MED ORDER — ASPIRIN 81 MG PO CHEW: 81.0000 mg | CHEWABLE_TABLET | ORAL | Status: DC

## 2018-10-09 MED ORDER — IOHEXOL 350 MG/ML SOLN
INTRAVENOUS | Status: DC | PRN
  Administered 2018-10-09: 30 mL via INTRA_ARTERIAL

## 2018-10-09 MED ORDER — HEPARIN (PORCINE) IN NACL 1000-0.9 UT/500ML-% IV SOLN
INTRAVENOUS | Status: DC | PRN
  Administered 2018-10-09 (×2): 500 mL

## 2018-10-09 MED ORDER — METOPROLOL TARTRATE 25 MG PO TABS: 12.5000 mg | ORAL_TABLET | Freq: Two times a day (BID) | ORAL | 11 refills | Status: DC

## 2018-10-09 MED ORDER — SODIUM CHLORIDE 0.9 % IV SOLN: 250.0000 mL | INTRAVENOUS | Status: DC | PRN

## 2018-10-09 MED ORDER — FENTANYL CITRATE (PF) 100 MCG/2ML IJ SOLN
INTRAMUSCULAR | Status: AC
  Filled 2018-10-09: qty 2

## 2018-10-09 MED ORDER — LIDOCAINE HCL (PF) 1 % IJ SOLN
INTRAMUSCULAR | Status: AC
  Filled 2018-10-09: qty 30

## 2018-10-09 MED ORDER — SODIUM CHLORIDE 0.9 % WEIGHT BASED INFUSION: 1.0000 mL/kg/h | INTRAVENOUS | Status: DC

## 2018-10-09 MED ORDER — SODIUM CHLORIDE 0.9% FLUSH: 3.0000 mL | INTRAVENOUS | Status: DC | PRN

## 2018-10-09 MED ORDER — METOPROLOL TARTRATE 5 MG/5ML IV SOLN
INTRAVENOUS | Status: DC | PRN
  Administered 2018-10-09: 5 mg via INTRAVENOUS

## 2018-10-09 MED ORDER — HEPARIN (PORCINE) IN NACL 1000-0.9 UT/500ML-% IV SOLN
INTRAVENOUS | Status: AC
  Filled 2018-10-09: qty 1000

## 2018-10-09 MED ORDER — VERAPAMIL HCL 2.5 MG/ML IV SOLN
INTRAVENOUS | Status: AC
  Filled 2018-10-09: qty 2

## 2018-10-09 MED ORDER — ACETAMINOPHEN 325 MG PO TABS: 650.0000 mg | ORAL_TABLET | ORAL | Status: DC | PRN

## 2018-10-09 MED ORDER — SODIUM CHLORIDE 0.9 % WEIGHT BASED INFUSION
1.0000 mL/kg/h | INTRAVENOUS | Status: DC
  Administered 2018-10-09: 500 mL via INTRAVENOUS

## 2018-10-09 MED ORDER — LIDOCAINE HCL (PF) 1 % IJ SOLN
INTRAMUSCULAR | Status: DC | PRN
  Administered 2018-10-09 (×2): 2 mL via INTRADERMAL

## 2018-10-09 MED ORDER — METOPROLOL TARTRATE 5 MG/5ML IV SOLN
INTRAVENOUS | Status: AC
  Filled 2018-10-09: qty 5

## 2018-10-09 MED ORDER — MIDAZOLAM HCL 2 MG/2ML IJ SOLN
INTRAMUSCULAR | Status: DC | PRN
  Administered 2018-10-09: 2 mg via INTRAVENOUS

## 2018-10-09 MED ORDER — MIDAZOLAM HCL 2 MG/2ML IJ SOLN
INTRAMUSCULAR | Status: AC
  Filled 2018-10-09: qty 2

## 2018-10-09 MED ORDER — SODIUM CHLORIDE 0.9 % WEIGHT BASED INFUSION
3.0000 mL/kg/h | INTRAVENOUS | Status: AC
  Administered 2018-10-09: 3 mL/kg/h via INTRAVENOUS

## 2018-10-09 MED ORDER — VERAPAMIL HCL 2.5 MG/ML IV SOLN
INTRAVENOUS | Status: DC | PRN
  Administered 2018-10-09: 10 mL via INTRA_ARTERIAL

## 2018-10-09 MED ORDER — FENTANYL CITRATE (PF) 100 MCG/2ML IJ SOLN
INTRAMUSCULAR | Status: DC | PRN
  Administered 2018-10-09: 25 ug via INTRAVENOUS

## 2018-10-09 SURGICAL SUPPLY — 13 items
CATH 5FR JL3.5 JR4 ANG PIG MP (CATHETERS) ×1 IMPLANT
CATH BALLN WEDGE 5F 110CM (CATHETERS) ×1 IMPLANT
DEVICE RAD COMP TR BAND LRG (VASCULAR PRODUCTS) ×1 IMPLANT
GLIDESHEATH SLEND SS 6F .021 (SHEATH) ×1 IMPLANT
GUIDEWIRE INQWIRE 1.5J.035X260 (WIRE) IMPLANT
INQWIRE 1.5J .035X260CM (WIRE) ×2
KIT HEART LEFT (KITS) ×2 IMPLANT
PACK CARDIAC CATHETERIZATION (CUSTOM PROCEDURE TRAY) ×2 IMPLANT
SHEATH GLIDE SLENDER 4/5FR (SHEATH) ×1 IMPLANT
TRANSDUCER W/STOPCOCK (MISCELLANEOUS) ×2 IMPLANT
TUBING CIL FLEX 10 FLL-RA (TUBING) ×2 IMPLANT
WIRE EMERALD 3MM-J .025X260CM (WIRE) ×1 IMPLANT
WIRE MICROINTRODUCER 60CM (WIRE) ×1 IMPLANT

## 2018-10-09 NOTE — Interval H&P Note (Signed)
History and Physical Interval Note:  10/09/2018 12:04 PM  Alejandro Burnett  has presented today for surgery, with the diagnosis of mr  The various methods of treatment have been discussed with the patient and family. After consideration of risks, benefits and other options for treatment, the patient has consented to  Procedure(s): RIGHT/LEFT HEART CATH AND CORONARY ANGIOGRAPHY (N/A) as a surgical intervention .  The patient's history has been reviewed, patient examined, no change in status, stable for surgery.  I have reviewed the patient's chart and labs.  Questions were answered to the patient's satisfaction.     Tonny Bollman

## 2018-10-09 NOTE — Discharge Instructions (Signed)
Radial Site Care ° °This sheet gives you information about how to care for yourself after your procedure. Your health care provider may also give you more specific instructions. If you have problems or questions, contact your health care provider. °What can I expect after the procedure? °After the procedure, it is common to have: °· Bruising and tenderness at the catheter insertion area. °Follow these instructions at home: °Medicines °· Take over-the-counter and prescription medicines only as told by your health care provider. °Insertion site care °· Follow instructions from your health care provider about how to take care of your insertion site. Make sure you: °? Wash your hands with soap and water before you change your bandage (dressing). If soap and water are not available, use hand sanitizer. °? Change your dressing as told by your health care provider. °? Leave stitches (sutures), skin glue, or adhesive strips in place. These skin closures may need to stay in place for 2 weeks or longer. If adhesive strip edges start to loosen and curl up, you may trim the loose edges. Do not remove adhesive strips completely unless your health care provider tells you to do that. °· Check your insertion site every day for signs of infection. Check for: °? Redness, swelling, or pain. °? Fluid or blood. °? Pus or a bad smell. °? Warmth. °· Do not take baths, swim, or use a hot tub until your health care provider approves. °· You may shower 24-48 hours after the procedure, or as directed by your health care provider. °? Remove the dressing and gently wash the site with plain soap and water. °? Pat the area dry with a clean towel. °? Do not rub the site. That could cause bleeding. °· Do not apply powder or lotion to the site. °Activity ° °· For 24 hours after the procedure, or as directed by your health care provider: °? Do not flex or bend the affected arm. °? Do not push or pull heavy objects with the affected arm. °? Do not  drive yourself home from the hospital or clinic. You may drive 24 hours after the procedure unless your health care provider tells you not to. °? Do not operate machinery or power tools. °· Do not lift anything that is heavier than 10 lb (4.5 kg), or the limit that you are told, until your health care provider says that it is safe. °· Ask your health care provider when it is okay to: °? Return to work or school. °? Resume usual physical activities or sports. °? Resume sexual activity. °General instructions °· If the catheter site starts to bleed, raise your arm and put firm pressure on the site. If the bleeding does not stop, get help right away. This is a medical emergency. °· If you went home on the same day as your procedure, a responsible adult should be with you for the first 24 hours after you arrive home. °· Keep all follow-up visits as told by your health care provider. This is important. °Contact a health care provider if: °· You have a fever. °· You have redness, swelling, or yellow drainage around your insertion site. °Get help right away if: °· You have unusual pain at the radial site. °· The catheter insertion area swells very fast. °· The insertion area is bleeding, and the bleeding does not stop when you hold steady pressure on the area. °· Your arm or hand becomes pale, cool, tingly, or numb. °These symptoms may represent a serious problem   that is an emergency. Do not wait to see if the symptoms will go away. Get medical help right away. Call your local emergency services (911 in the [place] Do not drive yourself to the hospital. °Summary °· After the procedure, it is common to have bruising and tenderness at the site. °· Follow instructions from your health care provider about how to take care of your radial site wound. Check the wound every day for signs of infection. °· Do not lift anything that is heavier than 10 lb (4.5 kg), or the limit that you are told, until your health care provider says  that it is safe. °This information is not intended to replace advice given to you by your health care provider. Make sure you discuss any questions you have with your health care provider. °Document Released: 09/11/2010 Document Revised: 09/14/2017 Document Reviewed: 09/14/2017 °[employer] Interactive Patient Education © 2019 [employer] ° °

## 2018-10-10 ENCOUNTER — Encounter (HOSPITAL_COMMUNITY): Payer: Self-pay | Admitting: Cardiovascular Disease

## 2018-10-16 ENCOUNTER — Other Ambulatory Visit: Payer: Self-pay

## 2018-10-16 ENCOUNTER — Ambulatory Visit (INDEPENDENT_AMBULATORY_CARE_PROVIDER_SITE_OTHER): Payer: Medicare Other | Admitting: Thoracic Surgery (Cardiothoracic Vascular Surgery)

## 2018-10-16 ENCOUNTER — Encounter: Payer: Self-pay | Admitting: Thoracic Surgery (Cardiothoracic Vascular Surgery)

## 2018-10-16 VITALS — BP 122/68 | HR 71 | Resp 16 | Ht 74.0 in | Wt 188.2 lb

## 2018-10-16 DIAGNOSIS — I340 Nonrheumatic mitral (valve) insufficiency: Secondary | ICD-10-CM

## 2018-10-16 DIAGNOSIS — I480 Paroxysmal atrial fibrillation: Secondary | ICD-10-CM | POA: Diagnosis not present

## 2018-10-16 DIAGNOSIS — I341 Nonrheumatic mitral (valve) prolapse: Secondary | ICD-10-CM | POA: Diagnosis not present

## 2018-10-16 NOTE — Patient Instructions (Addendum)
  Continue taking all current medications without change through the day before surgery.  Have nothing to eat or drink after midnight the night before surgery.  On the morning of surgery take only Nexium and Singulair with a sip of water.  You may use your inhaler if desired.

## 2018-10-16 NOTE — Progress Notes (Signed)
[address]       [name] [address]             [phone]     CARDIOTHORACIC SURGERY OFFICE NOTE  Referring Provider is Dulce Sellar, Iline Oven, MD PCP is [name], MD   HPI:  Patient is a 68 year old male who returns to the office today for follow-up of mitral valve prolapse with severe symptomatic primary mitral regurgitation.  He was originally seen in consultation on September 18, 2018 at which time we discussed the indications, risk, potential benefits of elective mitral valve repair.  Since then the patient underwent diagnostic cardiac catheterization and CT angiography.  Interestingly, during his brief hospital stay for catheterization the patient was noted to go in and out of atrial fibrillation with rapid ventricular rate.  Patient has no previously known diagnosis of paroxysmal atrial fibrillation, but in retrospect he states that he believes that his heart is been going in and out of rhythm periodically for several years.  He was started on a beta-blocker and low-dose aspirin with anticipation that he would likely undergo elective mitral valve repair and possible maze procedure in the near future.  He returns to our office today for follow-up.  He reports no new problems or complaints.  He remains concerned about the prospects of undergoing elective surgery.  He denies any symptoms of worsening exertional shortness of breath.  The remainder of his review of systems is unchanged from previously.   Current Outpatient Medications  Medication Sig Dispense Refill  . albuterol (PROVENTIL HFA;VENTOLIN HFA) 108 (90 Base) MCG/ACT inhaler Inhale 1-2 puffs into the lungs every 6 (six) hours as needed for wheezing or shortness of breath.     Marland Kitchen amoxicillin (AMOXIL) 500 MG capsule Take 4 capsules 45 minutes prior to dental procedures. (Patient taking differently: Take 2,000 mg by mouth See admin instructions. Take 2000 mg by mouth 45 minutes prior to dental procedures.) 20  capsule 0  . aspirin EC 81 MG tablet Take 81 mg by mouth daily.    Marland Kitchen esomeprazole (NEXIUM) 20 MG capsule Take 20 mg by mouth at bedtime.     . finasteride (PROSCAR) 5 MG tablet Take 5 mg by mouth at bedtime.     . fluticasone (FLONASE) 50 MCG/ACT nasal spray Place 1 spray into both nostrils daily as needed for allergies or rhinitis.    . fluticasone-salmeterol (ADVAIR HFA) 115-21 MCG/ACT inhaler Inhale 2 puffs into the lungs daily as needed (shortness of breath).     . metoprolol tartrate (LOPRESSOR) 25 MG tablet Take 0.5 tablets (12.5 mg total) by mouth 2 (two) times daily. 60 tablet 11  . montelukast (SINGULAIR) 10 MG tablet Take 10 mg by mouth daily as needed (allergies).     . Omega-3 Fatty Acids (FISH OIL) 1000 MG CAPS Take 1,000 mg by mouth daily.    . simvastatin (ZOCOR) 40 MG tablet Take 40 mg by mouth at bedtime.      No current facility-administered medications for this visit.       Physical Exam:   BP 122/68 (BP Location: Right Arm, Patient Position: Sitting, Cuff Size: Large)   Pulse 71   Resp 16   Ht 6\' 2"  (1.88 m)   Wt 188 lb 3.2 oz (85.4 kg)   SpO2 95% Comment: ON RA  BMI 24.16 kg/m   General:  Well-appearing  Chest:   Clear to auscultation  CV:   Regular rate and rhythm with prominent  holosystolic murmur  Incisions:  n/a  Abdomen:  Soft nontender  Extremities:  Warm and well-perfused  Diagnostic Tests:  RIGHT/LEFT HEART CATH AND CORONARY ANGIOGRAPHY  Conclusion   1.  Angiographically normal coronary arteries (right dominant) 2.  Low intracardiac filling pressures 3. Systemic hypotension during procedure related to conscious sedation - pt remained cognitively intact and stable throughout. BP normalized after sedation wore off.  Recommendations: The patient will continue preoperative evaluation for minimally invasive mitral valve repair.  He has normal coronary arteries.  He is incidentally noted to be in atrial fibrillation with mildly increased ventricular  rate.  I will start him on a low-dose of metoprolol and he has scheduled follow-up with Dr. Cornelius Moras next week.  Will not start him on anticoagulation since he will have surgery in the near future requiring temporary discontinuation of anticoagulant drugs.  Indications   Severe mitral regurgitation [I34.0 (ICD-10-CM)]  Procedural Details   Technical Details INDICATION: Severe mitral regurgitation, preoperative study  PROCEDURAL DETAILS: There was an indwelling IV in a right antecubital vein. Using normal sterile technique, the IV was changed out for a 5 Fr brachial sheath over a 0.018 inch wire. The right wrist was then prepped, draped, and anesthetized with 1% lidocaine. Using the modified Seldinger technique a 5/6 French Slender sheath was placed in the right radial artery. Intra-arterial verapamil was administered through the radial artery sheath. IV heparin was administered after a JR4 catheter was advanced into the central aorta. A Swan-Ganz catheter was used for the right heart catheterization. Standard protocol was followed for recording of right heart pressures and sampling of oxygen saturations. Fick cardiac output was calculated. Standard Judkins catheters were used for selective coronary angiography. LV pressure is recorded and an aortic valve pullback is performed. There were no immediate procedural complications. The patient was transferred to the post catheterization recovery area for further monitoring.    Estimated blood loss <50 mL.   During this procedure medications were administered to achieve and maintain moderate conscious sedation while the patient's heart rate, blood pressure, and oxygen saturation were continuously monitored and I was present face-to-face 100% of this time.  Medications  (Filter: Administrations occurring from 10/09/18 1222 to 10/09/18 1329)  Medication Rate/Dose/Volume Action  Date Time   midazolam (VERSED) injection (mg) 2 mg Given 10/09/18 1250   Total dose  as of 10/16/18 1542        2 mg        fentaNYL (SUBLIMAZE) injection (mcg) 25 mcg Given 10/09/18 1251   Total dose as of 10/16/18 1542        25 mcg        lidocaine (PF) (XYLOCAINE) 1 % injection (mL) 2 mL Given 10/09/18 1256   Total dose as of 10/16/18 1542 2 mL Given 1258   4 mL        Heparin (Porcine) in NaCl 1000-0.9 UT/500ML-% SOLN (mL) 500 mL Given 10/09/18 1256   Total dose as of 10/16/18 1542 500 mL Given 1257   1,000 mL        Radial Cocktail/Verapamil only (mL) 10 mL Given 10/09/18 1259   Total dose as of 10/16/18 1542        10 mL        metoprolol tartrate (LOPRESSOR) injection (mg) 5 mg Given 10/09/18 1253   Total dose as of 10/16/18 1542        5 mg        heparin injection (Units) 4,000 Units Given 10/09/18  1310   Total dose as of 10/16/18 1542        4,000 Units        iohexol (OMNIPAQUE) 350 MG/ML injection (mL) 30 mL Given 10/09/18 1318   Total dose as of 10/16/18 1542        30 mL        Sedation Time   Sedation Time Physician-1: 23 minutes 40 seconds  Coronary Findings   Diagnostic  Dominance: Right  Left Main  Vessel is large.  Left Anterior Descending  The LAD is a large vessel that reaches the apex. The first diagonal is large and divides into twin vessels. There is no stenosis throughout the LAD or diagonal branches.  Left Circumflex  Vessel is moderate in size. Vessel is angiographically normal. The circumflex is patent without stenosis.  Right Coronary Artery  Vessel is large. Vessel is angiographically normal. Large, dominant RCA without stenosis. The PDA and PLA branches are widely patent.  Intervention   No interventions have been documented.  Coronary Diagrams   Diagnostic  Dominance: Right    Intervention   Implants    No implant documentation for this case.  Syngo Images   Show images for CARDIAC CATHETERIZATION  MERGE Images   Show images for CARDIAC CATHETERIZATION   Link to Procedure Log   Procedure Log    Hemo Data      Most Recent Value  Fick Cardiac Output 4.23 L/min  Fick Cardiac Output Index 2.04 (L/min)/BSA  RA A Wave 1 mmHg  RA V Wave 1 mmHg  RA Mean 1 mmHg  RV Systolic Pressure 17 mmHg  RV Diastolic Pressure -1 mmHg  RV EDP 1 mmHg  PA Systolic Pressure 14 mmHg  PA Diastolic Pressure 7 mmHg  PA Mean 10 mmHg  PW A Wave -99 mmHg  PW V Wave 14 mmHg  PW Mean 11 mmHg  LV Systolic Pressure 77 mmHg  LV Diastolic Pressure 0 mmHg  LV EDP 0 mmHg  AOp Systolic Pressure 81 mmHg  AOp Diastolic Pressure 56 mmHg  AOp Mean Pressure 68 mmHg  LVp Systolic Pressure 81 mmHg  LVp Diastolic Pressure 0 mmHg  LVp EDP Pressure 2 mmHg  QP/QS 1  TPVR Index 4.9 HRUI     CT ANGIOGRAPHY CHEST, ABDOMEN AND PELVIS  TECHNIQUE: Multidetector CT imaging through the chest, abdomen and pelvis was performed using the standard protocol during bolus administration of intravenous contrast. Multiplanar reconstructed images and MIPs were obtained and reviewed to evaluate the vascular anatomy.  CONTRAST:  75mL ISOVUE-370 IOPAMIDOL (ISOVUE-370) INJECTION 76%  COMPARISON:  Prior CT scan of the abdomen and pelvis 05/24/2018 and 12/29/2012  FINDINGS: CTA CHEST FINDINGS  Cardiovascular: Conventional 3 vessel aortic arch. The aortic root, ascending, transverse and descending thoracic aorta are all normal in caliber. No significant atherosclerotic plaque or evidence of dissection. The main and central pulmonary arteries are normal in caliber. No central pulmonary embolus. The heart is normal in size. No pericardial effusion. Unremarkable pulmonary venous drainage pattern.  Mediastinum/Nodes: Unremarkable CT appearance of the thyroid gland. No suspicious mediastinal or hilar adenopathy. No soft tissue mediastinal mass. The thoracic esophagus is unremarkable.  Lungs/Pleura: Mild biapical pleuroparenchymal scarring. Stable 6 mm subpleural pulmonary nodule in the anterior right middle lobe across multiple  prior studies dating back to 2014 consistent with a benign process. Minimal dependent atelectasis in the lower lobes. Diffuse mild bronchial wall thickening. Scattered small 2 and 3 mm bilateral lower lobe pulmonary nodules remain stable dating  back to 2014.  Musculoskeletal: No acute fracture or aggressive appearing lytic or blastic osseous lesion.  Review of the MIP images confirms the above findings.  CTA ABDOMEN AND PELVIS FINDINGS  VASCULAR  Aorta: Normal caliber aorta without aneurysm, dissection, vasculitis or significant stenosis.  Celiac: Patent without evidence of aneurysm, dissection, vasculitis or significant stenosis. 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  SMA: Patent without evidence of aneurysm, dissection, vasculitis or significant stenosis.  Renals: There are 3 right-sided renal arteries and a single left-sided renal artery. No evidence of fibromuscular dysplasia, stenosis or aneurysm.  IMA: Patent without evidence of aneurysm, dissection, vasculitis or significant stenosis.  Inflow: Patent without evidence of aneurysm, dissection, vasculitis or significant stenosis.  Veins: No focal venous abnormality.  Review of the MIP images confirms the above findings.  NON-VASCULAR  Hepatobiliary: Normal hepatic contour and morphology. No discrete hepatic lesions. Normal appearance of the gallbladder. No intra or extrahepatic biliary ductal dilatation.  Pancreas: Unremarkable. No pancreatic ductal dilatation or surrounding inflammatory changes.  Spleen: Normal in size without focal abnormality.  Adrenals/Urinary Tract: Normal adrenal glands. No evidence of hydronephrosis or enhancing renal mass. A 1.2 cm nonobstructing stone is identified in the left lower pole collecting system. The ureters and bladder are unremarkable.  Stomach/Bowel: Colonic diverticular disease without CT evidence of active inflammation. No  focal bowel obstruction.  Lymphatic: No suspicious lymphadenopathy.  Reproductive: Prostate is unremarkable.  Other: No abdominal wall hernia or abnormality. No abdominopelvic ascites.  Musculoskeletal: No acute fracture or aggressive appearing lytic or blastic osseous lesion. Focal L5-S1 degenerative disc disease.  Review of the MIP images confirms the above findings.  IMPRESSION: 1. No evidence of significant atherosclerotic plaque, aneurysm, dissection or other significant vascular abnormality in the chest, abdomen or pelvis. 2. 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 3. Stable bilateral pulmonary nodules dating back to 12/29/2012 consistent with benign granulomas. 4. Diffuse mild bronchial wall thickening consistent with the clinical history of asthma. 5. Nonobstructing left lower pole nephrolithiasis. 6. Extensive colonic diverticulosis without evidence of acute diverticulitis. 7. Focal L5-S1 degenerative disc disease.  Signed,  Sterling Big, MD, RPVI  [facility]  [facility]   Electronically Signed   By: Malachy Moan M.D.   On: 09/26/2018 14:47    Impression:  Patient has mitral valve prolapse with stage D severe symptomatic primary mitral regurgitation and recently diagnosed paroxysmal atrial fibrillation.  He reports symptoms of mild exertional shortness of breath, decreased exercise tolerance, and a single dizzy spell following exertion.  He has a long history of intermittent atypical chest pain and palpitations, and after he was found to have paroxysmal atrial fibrillation during his recent hospital stay he reports that he believes he has had similar episodes of atrial fibrillation for several years.  I personally reviewed the patient's recent transthoracic and transesophageal echocardiograms,  diagnostic cardiac catheterization, and CT angiogram.  Patient has mitral valve prolapse with an obvious flail segment involving a portion of the middle scallop of the posterior leaflet.  There is severe mitral regurgitation.  The jet of regurgitation is eccentric, which explains why it was underestimated on transthoracic echocardiogram.  Left ventricular systolic function remains normal.  I agree the patient would likely benefit from elective mitral valve repair.  Based upon review of the patient's TEE I feel there is a high likelihood that his valve should be repairable with very low  associated operative risk.    Diagnostic cardiac catheterization is notable for the absence of significant coronary artery disease.  Right heart pressures were normal.  CT angiography reveals no contraindication to peripheral cannulation for surgery.    Plan:  The patient and his close friend were again counseled at length regarding the indications, risk, and potential benefits of elective mitral valve repair.  Added concerns related to the newly discovered diagnosis of paroxysmal atrial fibrillation were discussed including the indications for long-term anticoagulation and potential risk of stroke.  The associated risks and benefits of concomitant Maze procedure were discussed.  Alternative surgical approaches have been discussed including a comparison between conventional sternotomy and minimally-invasive techniques.  Expectations regarding his postoperative convalescence were discussed.  The patient has been offered elective surgery within the next week.  For a variety of personal reasons he prefers to wait until the end of March.  We tentatively plan to proceed with surgery on November 14, 2018.  The patient will return to our office for follow-up prior to surgery on November 13, 2018.   I spent in excess of 25 minutes during the conduct of this office consultation and >50% of this time involved direct face-to-face encounter  with the patient for counseling and/or coordination of their care.   Salvatore Decent. Cornelius Moras, MD 10/16/2018 3:41 PM

## 2018-10-17 ENCOUNTER — Other Ambulatory Visit: Payer: Self-pay | Admitting: *Deleted

## 2018-10-17 ENCOUNTER — Encounter: Payer: Self-pay | Admitting: *Deleted

## 2018-10-17 DIAGNOSIS — I4891 Unspecified atrial fibrillation: Secondary | ICD-10-CM

## 2018-10-17 DIAGNOSIS — I340 Nonrheumatic mitral (valve) insufficiency: Secondary | ICD-10-CM

## 2018-11-09 ENCOUNTER — Other Ambulatory Visit: Payer: Self-pay | Admitting: *Deleted

## 2018-11-09 ENCOUNTER — Telehealth: Payer: Self-pay | Admitting: *Deleted

## 2018-11-09 NOTE — Telephone Encounter (Signed)
Patient's elective surgery (Mini MVR/MAZE) that was scheduled for Tuesday 3/24 has been canceled at this time due to COVID-19.  All non-emergent cases will be postponed and the patient agrees and understands.  I will call patient in 2 weeks with a plan and confirm new surgery date.  I told him we will pencil him in for 4/15 but we will not make this official until I speak with him again.  I have sent Dr. Dulce Sellar a message to address if the patient needs any anticoagulation for his A-fib.  Patient has no further questions.

## 2018-11-13 ENCOUNTER — Ambulatory Visit: Payer: Medicare Other | Admitting: Thoracic Surgery (Cardiothoracic Vascular Surgery)

## 2018-11-13 ENCOUNTER — Encounter (HOSPITAL_COMMUNITY): Payer: Medicare Other

## 2018-11-13 ENCOUNTER — Inpatient Hospital Stay (HOSPITAL_COMMUNITY): Admission: RE | Admit: 2018-11-13 | Payer: Medicare Other | Source: Ambulatory Visit

## 2018-11-13 ENCOUNTER — Telehealth: Payer: Self-pay | Admitting: Thoracic Surgery (Cardiothoracic Vascular Surgery)

## 2018-11-13 ENCOUNTER — Other Ambulatory Visit: Payer: Self-pay | Admitting: *Deleted

## 2018-11-13 ENCOUNTER — Encounter: Payer: Self-pay | Admitting: Thoracic Surgery (Cardiothoracic Vascular Surgery)

## 2018-11-13 ENCOUNTER — Ambulatory Visit (HOSPITAL_COMMUNITY): Payer: Medicare Other

## 2018-11-13 DIAGNOSIS — I4891 Unspecified atrial fibrillation: Secondary | ICD-10-CM

## 2018-11-13 DIAGNOSIS — I340 Nonrheumatic mitral (valve) insufficiency: Secondary | ICD-10-CM

## 2018-11-13 NOTE — Telephone Encounter (Signed)
[address]       [name] [address]             [phone]     CARDIOTHORACIC SURGERY TELEPHONE VIRTUAL OFFICE NOTE  Referring Provider is Baldo Daub, MD Primary Cardiologist is No primary care provider on file. PCP is [name], MD   HPI:  Patient is a 68 year old male with mitral valve prolapse, severe symptomatic primary mitral regurgitation, and recurrent paroxysmal atrial fibrillation who was recently seen in consultation and scheduled for elective mitral valve repair and Maze procedure this week.  I telephoned the patient to discuss circumstances surrounding the ongoing COVID-19 pandemic as it relates to elective surgical procedures.  The patient reports that he is currently doing quite well.  He is really minimally symptomatic, getting short of breath only with strenuous exertion.  His breathing has not gotten any worse over the past few weeks.  He still has occasional palpitations.  He denies any chest pain.  He has not developed fever or productive cough.  He has not been traveling and to his knowledge she has not been exposed to anyone with known COVID-19 infection.   Current Outpatient Medications  Medication Sig Dispense Refill  . albuterol (PROVENTIL HFA;VENTOLIN HFA) 108 (90 Base) MCG/ACT inhaler Inhale 1-2 puffs into the lungs every 6 (six) hours as needed for wheezing or shortness of breath.     Marland Kitchen amoxicillin (AMOXIL) 500 MG capsule Take 4 capsules 45 minutes prior to dental procedures. (Patient taking differently: Take 2,000 mg by mouth See admin instructions. Take 2000 mg by mouth 45 minutes prior to dental procedures.) 20 capsule 0  . aspirin EC 81 MG tablet Take 81 mg by mouth daily.    Marland Kitchen esomeprazole (NEXIUM) 40 MG capsule Take 40 mg by mouth at bedtime.     . finasteride (PROSCAR) 5 MG tablet Take 5 mg by mouth at bedtime.     . fluticasone (FLONASE) 50 MCG/ACT nasal spray Place 1 spray into both nostrils daily.     .  fluticasone-salmeterol (ADVAIR HFA) 115-21 MCG/ACT inhaler Inhale 2 puffs into the lungs daily as needed (shortness of breath).     . metoprolol tartrate (LOPRESSOR) 25 MG tablet Take 0.5 tablets (12.5 mg total) by mouth 2 (two) times daily. 60 tablet 11  . montelukast (SINGULAIR) 10 MG tablet Take 10 mg by mouth at bedtime.     . Omega-3 Fatty Acids (FISH OIL) 1000 MG CAPS Take 1,000 mg by mouth daily.    . simvastatin (ZOCOR) 40 MG tablet Take 40 mg by mouth at bedtime.     . tamsulosin (FLOMAX) 0.4 MG CAPS capsule Take 0.4 mg by mouth daily.     No current facility-administered medications for this visit.       Physical Exam:  N/A  Diagnostic Tests:  N/A   Impression:  Patient has mitral valve prolapse with stage D severe symptomatic primary mitral regurgitation and recurrent paroxysmal atrial fibrillation.  He experiences only very mild symptoms of exertional shortness of breath and slightly decreased exercise tolerance.  Symptoms have not gotten any worse over the last several weeks.  Recent transthoracic and transesophageal echocardiograms document the presence of normal left ventricular size and systolic function.  Right heart pressures were normal at the time of his recent catheterization.  Under the circumstances it seems reasonable to postpone surgical intervention temporarily because of the ongoing COVID-19 pandemic.   Plan:  I have discussed matters  at length with the patient including the need to postpone all elective surgical procedures.  The patient understands entirely and agrees with the plan.  We will reschedule surgery in approximately 3 or 4 weeks and revisit plans at that time depending on circumstances.  Patient has been encouraged to follow all guidelines provided by the CDC in effort to mitigate the likelihood that he might contract the COVID-19 virus.  We will defer whether or not the patient should be placed on oral anticoagulation because of his history of  recurrent paroxysmal atrial fibrillation to Dr. Dulce Sellar.  His CHADS-VASC score is low, so it's probably reasonable to hold off.  All questions answered.  I spent in excess of 15 minutes during the conduct of this office consultation and >50% of this time involved coordination of their care.    Salvatore Decent. Cornelius Moras, MD 11/13/2018 12:27 PM

## 2018-11-14 ENCOUNTER — Encounter (HOSPITAL_COMMUNITY): Admission: RE | Payer: Self-pay | Source: Home / Self Care

## 2018-11-14 ENCOUNTER — Encounter: Payer: Self-pay | Admitting: *Deleted

## 2018-11-14 ENCOUNTER — Inpatient Hospital Stay (HOSPITAL_COMMUNITY)
Admission: RE | Admit: 2018-11-14 | Payer: Medicare Other | Source: Home / Self Care | Admitting: Thoracic Surgery (Cardiothoracic Vascular Surgery)

## 2018-11-14 SURGERY — REPAIR, MITRAL VALVE, MINIMALLY INVASIVE
Anesthesia: General | Site: Chest | Laterality: Right

## 2018-12-05 ENCOUNTER — Other Ambulatory Visit: Payer: Self-pay

## 2018-12-05 ENCOUNTER — Telehealth: Payer: Self-pay | Admitting: *Deleted

## 2018-12-05 ENCOUNTER — Other Ambulatory Visit: Payer: Self-pay | Admitting: *Deleted

## 2018-12-05 MED ORDER — APIXABAN 5 MG PO TABS: 5.0000 mg | ORAL_TABLET | Freq: Two times a day (BID) | ORAL | 3 refills | Status: DC

## 2018-12-05 NOTE — Telephone Encounter (Signed)
Called patient to discuss medication changes. Patient states he can not pick up med until tomorrow. He will discontinue  aspirin in morning and start Eliquis tomorrow evening. Patient advised to let our office now or reaffirm with Dr. Cornelius Moras when he has a definite date for surgery to discuss discontinuing medication. Pharmacy confirmed. No further questions at this time.

## 2018-12-05 NOTE — Telephone Encounter (Signed)
Patient was scheduled for srgy on 4/22 with Dr. Cornelius Moras but we are again having to r/s until COVID restrictions have lifted.  Patient understands and says he is feeling great and still working.  He asked that I just make a follow-up appt in June with Dr. Cornelius Moras and at that time we will re-schedule surgery.  His only question was about needing a blood thinner for his A-fib.  I told him I would send Dr. Dulce Sellar a message for his office to address.  Patient understands and has no further questions.  He knows to call me if he needs anything or if he develops any symptoms.  Dr. Cornelius Moras made aware.

## 2018-12-11 ENCOUNTER — Ambulatory Visit (HOSPITAL_COMMUNITY): Payer: Medicare Other

## 2018-12-11 ENCOUNTER — Inpatient Hospital Stay (HOSPITAL_COMMUNITY): Admission: RE | Admit: 2018-12-11 | Payer: Medicare Other | Source: Ambulatory Visit

## 2018-12-11 ENCOUNTER — Ambulatory Visit: Payer: Medicare Other | Admitting: Thoracic Surgery (Cardiothoracic Vascular Surgery)

## 2018-12-13 ENCOUNTER — Other Ambulatory Visit: Payer: Self-pay

## 2018-12-13 ENCOUNTER — Inpatient Hospital Stay (HOSPITAL_COMMUNITY)
Admission: RE | Admit: 2018-12-13 | Payer: Medicare Other | Source: Home / Self Care | Admitting: Thoracic Surgery (Cardiothoracic Vascular Surgery)

## 2018-12-13 ENCOUNTER — Encounter (HOSPITAL_COMMUNITY): Admission: RE | Payer: Self-pay | Source: Home / Self Care

## 2018-12-13 ENCOUNTER — Telehealth: Payer: Self-pay

## 2018-12-13 SURGERY — REPAIR, MITRAL VALVE, MINIMALLY INVASIVE
Anesthesia: General | Site: Chest | Laterality: Right

## 2018-12-13 MED ORDER — RIVAROXABAN 20 MG PO TABS: 20.0000 mg | ORAL_TABLET | Freq: Every day | ORAL | 1 refills | Status: DC

## 2018-12-13 NOTE — Telephone Encounter (Signed)
[facility] calling to inform us that prior auth for Eliquis was denied by Medicare. Medicare prefers xarelto.   pls advise, tx

## 2018-12-13 NOTE — Addendum Note (Signed)
Addended by: Jacques Earthly A on: 12/13/2018 03:06 PM   Modules accepted: Orders

## 2018-12-13 NOTE — Telephone Encounter (Signed)
Orders received to discontinue eliquis and start xarelto. Order sent to pharmacy and patient informed. He is agreeable to the above.

## 2018-12-13 NOTE — Telephone Encounter (Signed)
Xarelto 20 mg daily #90 one daily

## 2018-12-18 ENCOUNTER — Telehealth: Payer: Self-pay | Admitting: Cardiology

## 2018-12-18 NOTE — Telephone Encounter (Signed)
Received phone message that patient recently started on xarelto but the cost is $480 and he can't afford it. Checked with [facility] to see if prior auth needed, they said they had called his insurance and the $480 cost is his remaining deductible, not the cost of the medication.     Phoned patient, explained the above. Patient understands but cannot afford his deductible and would like to be put on something less expensive.   pls advise, tx

## 2018-12-18 NOTE — Telephone Encounter (Signed)
Alejandro Burnett July 02, 1951 was put on Xarelto 20MG  and his copay is $482.00 -- he can't afford it on wants to know what else he can be put on. He requested Warfarin possibly?

## 2018-12-18 NOTE — Telephone Encounter (Signed)
I'm not familiar with which anti-coagulants are non-vitamin K Dr. Judie Petit, can you list them and I'll direct him to the pharmacy with that information? tx

## 2018-12-18 NOTE — Telephone Encounter (Signed)
Usually the formulary will have 1 of the drugs Xarelto Eliquis edoxaban or Pradaxa as a preferred in the range of $30-$50 a month.  If he does not have one and the only choice here is warfarin I would not subject him to Coumadin pending his valve surgery.  He just needs to ask the pharmacist with his benefit management plan which drug is preferred and are any of these affordable.

## 2018-12-18 NOTE — Telephone Encounter (Signed)
He needs to have a discussion with the pharmacist regarding the cost of the other non-vitamin K anticoagulants to ask  him that question because it depends on his benefit management.  If the option is warfarin I do not want to anticoagulate him pending elective surgery

## 2018-12-19 ENCOUNTER — Telehealth: Payer: Self-pay

## 2018-12-19 ENCOUNTER — Telehealth: Payer: Self-pay | Admitting: *Deleted

## 2018-12-19 NOTE — Telephone Encounter (Signed)
Called patient, informed that Alejandro Burnett at [facility] is working on getting a tier exception authorized for him which would lower his cost for [employer]. Informed patient Alejandro Burnett expects to have an answer on this within 24-72 hours and she will fax this info to [place] and send a letter to him. Told patient we will follow-up with him when we get the information. Verbalizes understanding, denies further questions or concerns

## 2018-12-19 NOTE — Telephone Encounter (Deleted)
          Alejandro Burnett, CMA    Patient phoned at [phone], informed that Dr. Dulce Sellar recommends that he call his pharmacy and ask if Eliquis, edoxaban, Pradaxa or Xarelto are on formulary and with a much lower copay, usually between $30-$50. Patient's response as below  12/19/18 9:42 AM  Note    Pt called back and said none of the blood thinners he called pharmacy about are in his price range. Too expensive. Pt doesn't understand why now all of sudden he needs blood thinner. Has had this problem since 2008 and nobody was worried about it until dx with a-fib. Please advise.

## 2018-12-19 NOTE — Telephone Encounter (Signed)
Phoned patient, directed to call pharmacy and ask if Eliquis, Edoxaban, Pradexa or Xarelto are formulary and low cost, call us back with the name of the anticoagulant and we will send in a prescription for that drug. Also informed that Dr. Dulce Sellar does not want to prescribe coumadin before surgery. Patient verbalizes understanding, will follow-up with [place] after speaking with pharmacy

## 2018-12-19 NOTE — Telephone Encounter (Signed)
Pt called back and said none of the blood thinners he called pharmacy about are in his price range. Too expensive. Pt doesn't understand why now all of sudden he needs blood thinner. Has had this problem since 2008 and nobody was worried about it until dx with a-fib. Please advise.

## 2018-12-19 NOTE — Telephone Encounter (Signed)
See previous note

## 2018-12-19 NOTE — Telephone Encounter (Signed)
Patient called back to inform that none of the blood thinners (Xarelto, Eliquis, Edoxaban, Pradaxa)  Dr. Dulce Sellar suggested might be available at a copay of $30-$50 were available in that co-pay range. Directed patient to speak with his insurance and pharmacy to see if there is any patient assistance available for medication.    Received call from [place] at [facility] to gather information for care exception to get xarelto offered in a lower tier/copay.    Minna Antis will submit tier exception request and fax [place] response and send a letter to patient with outcome of request within 24-48 hours.

## 2018-12-19 NOTE — Telephone Encounter (Signed)
I know you were working on this yesterday.

## 2018-12-25 ENCOUNTER — Telehealth: Payer: Self-pay | Admitting: Cardiology

## 2018-12-27 ENCOUNTER — Telehealth (INDEPENDENT_AMBULATORY_CARE_PROVIDER_SITE_OTHER): Payer: Medicare Other | Admitting: Thoracic Surgery (Cardiothoracic Vascular Surgery)

## 2018-12-27 ENCOUNTER — Encounter: Payer: Self-pay | Admitting: Thoracic Surgery (Cardiothoracic Vascular Surgery)

## 2018-12-27 DIAGNOSIS — I340 Nonrheumatic mitral (valve) insufficiency: Secondary | ICD-10-CM | POA: Diagnosis not present

## 2018-12-27 DIAGNOSIS — I341 Nonrheumatic mitral (valve) prolapse: Secondary | ICD-10-CM

## 2018-12-27 NOTE — Telephone Encounter (Signed)
[address]       Alejandro Burnett [address]             [phone]     CARDIOTHORACIC SURGERY TELEPHONE VIRTUAL OFFICE NOTE  Referring Provider is Alejandro Sellar Iline Oven, MD PCP is Alejandro Burnett., MD   HPI:  I spoke with Alejandro Burnett (DOB Dec 28, 1950 ) via telephone on 12/27/2018 at 10:18 AM and verified that I was speaking with the correct person using more than one form of identification.  We discussed the reason(s) for conducting our visit virtually instead of in-person.  The patient expressed understanding the circumstances and agreed to proceed as described.   Patient is a 68 year old male with mitral valve prolapse, severe symptomatic primary mitral regurgitation, and recurrent paroxysmal atrial fibrillation who was recently scheduled for elective mitral valve repair and Maze procedure on November 14, 2018.  His surgery was postponed because of the ongoing COVID-19 pandemic.  I telephoned the patient today to check to see how he is doing clinically and to discuss the timing for rescheduling his surgery.  He reports that he is back at work and getting along quite well.  He performs a great deal of strenuous physical activity at work including chopping wood and yesterday carrying two 5 gallon buckets of water for more than 300 yards.  He reports that he feels quite well.  He states that not too long ago he tried taking Flomax again and within a few hours he began to feel poorly.  He is convinced that Flomax originally caused him to go into atrial fibrillation and he has not been having any clinical problems since he has been off of Flomax and maintaining sinus rhythm.  He denies any exertional shortness of breath, PND, orthopnea, or lower extremity edema.  He is not been having chest pain or chest tightness.  He denies any fevers or productive cough.  He states that he has not been exposed to any persons with known or suspected COVID-19 infection.   Current Outpatient Medications   Medication Sig Dispense Refill  . albuterol (PROVENTIL HFA;VENTOLIN HFA) 108 (90 Base) MCG/ACT inhaler Inhale 1-2 puffs into the lungs every 6 (six) hours as needed for wheezing or shortness of breath.     Marland Kitchen amoxicillin (AMOXIL) 500 MG capsule Take 4 capsules 45 minutes prior to dental procedures. (Patient taking differently: Take 2,000 mg by mouth See admin instructions. Take 2000 mg by mouth 45 minutes prior to dental procedures.) 20 capsule 0  . esomeprazole (NEXIUM) 40 MG capsule Take 40 mg by mouth at bedtime.     . finasteride (PROSCAR) 5 MG tablet Take 5 mg by mouth at bedtime.     . fluticasone (FLONASE) 50 MCG/ACT nasal spray Place 1 spray into both nostrils daily.     . fluticasone-salmeterol (ADVAIR HFA) 115-21 MCG/ACT inhaler Inhale 2 puffs into the lungs daily as needed (shortness of breath).     . metoprolol tartrate (LOPRESSOR) 25 MG tablet Take 0.5 tablets (12.5 mg total) by mouth 2 (two) times daily. 60 tablet 11  . montelukast (SINGULAIR) 10 MG tablet Take 10 mg by mouth at bedtime.     . Omega-3 Fatty Acids (FISH OIL) 1000 MG CAPS Take 1,000 mg by mouth daily.    . rivaroxaban (XARELTO) 20 MG TABS tablet Take 1 tablet (20 mg total) by mouth daily with supper. 90 tablet 1  . simvastatin (ZOCOR) 40 MG tablet Take 40 mg by mouth at  bedtime.     . tamsulosin (FLOMAX) 0.4 MG CAPS capsule Take 0.4 mg by mouth daily.     No current facility-administered medications for this visit.      Diagnostic Tests:  n/a   Impression:  Patient has mitral valve prolapse with stage D severe symptomatic primary mitral regurgitation.  He originally presented with new onset paroxysmal atrial fibrillation.  He has clinically been doing well and remains essentially asymptomatic since he has been maintaining sinus rhythm.  He would likely best be treated by elective mitral valve repair and Maze procedure at some point in the not too distant future, but currently he remains quite stable.       Plan:  I have discussed the nature of the patient's clinical problem and the indications for surgical intervention over the telephone.  The timing of surgery has been discussed particular with reference to the ongoing COVID-19 pandemic.  The patient does not wish to reschedule surgery at this time but he is looking forward to returning to our office for a routine face-to-face office visit that has been scheduled for early June.  When he returns at that time we will further discuss the timing of surgery.  During the interim he will call should he develop problems with exertional shortness of breath, increased fatigue, palpitations, chest discomfort, or dizzy spells.    I discussed limitations of evaluation and management via telephone.  The patient was advised to call back for repeat telephone consultation or to seek an in-person evaluation if questions arise or the patient's clinical condition changes in any significant manner.  I spent in excess of 10 minutes of non-face-to-face time during the conduct of this telephone virtual office consultation.     Salvatore Decentlarence H. Cornelius Moraswen, MD 12/27/2018 10:18 AM

## 2018-12-29 NOTE — Telephone Encounter (Signed)
error 

## 2019-01-22 ENCOUNTER — Other Ambulatory Visit: Payer: Self-pay | Admitting: *Deleted

## 2019-01-22 ENCOUNTER — Ambulatory Visit (INDEPENDENT_AMBULATORY_CARE_PROVIDER_SITE_OTHER): Payer: Medicare Other | Admitting: Thoracic Surgery (Cardiothoracic Vascular Surgery)

## 2019-01-22 ENCOUNTER — Other Ambulatory Visit: Payer: Self-pay | Admitting: Cardiology

## 2019-01-22 ENCOUNTER — Other Ambulatory Visit: Payer: Self-pay

## 2019-01-22 ENCOUNTER — Encounter: Payer: Self-pay | Admitting: *Deleted

## 2019-01-22 ENCOUNTER — Encounter: Payer: Self-pay | Admitting: Thoracic Surgery (Cardiothoracic Vascular Surgery)

## 2019-01-22 VITALS — BP 123/77 | HR 81 | Temp 97.5°F | Resp 16 | Ht 73.0 in | Wt 186.0 lb

## 2019-01-22 DIAGNOSIS — I340 Nonrheumatic mitral (valve) insufficiency: Secondary | ICD-10-CM

## 2019-01-22 DIAGNOSIS — I341 Nonrheumatic mitral (valve) prolapse: Secondary | ICD-10-CM | POA: Diagnosis not present

## 2019-01-22 DIAGNOSIS — I480 Paroxysmal atrial fibrillation: Secondary | ICD-10-CM

## 2019-01-22 DIAGNOSIS — I4891 Unspecified atrial fibrillation: Secondary | ICD-10-CM

## 2019-01-22 MED ORDER — RIVAROXABAN 20 MG PO TABS: 20.0000 mg | ORAL_TABLET | Freq: Every day | ORAL | 0 refills | Status: DC

## 2019-01-22 NOTE — Progress Notes (Signed)
[address]       [name] [address]             [phone]     CARDIOTHORACIC SURGERY OFFICE NOTE  Referring Provider is Baldo Daub, MD PCP is [name], MD   HPI:  Patient is a 68 year old male who returns to the office today for follow-up of mitral valve prolapse with stage D severe symptomatic primary mitral regurgitation and recurrent paroxysmal atrial fibrillation.  He was originally seen in consultation on September 18, 2018.  He was last seen here in our office on October 16, 2018 at which time we made tentative plans to proceed with minimally invasive mitral valve repair and Maze procedure in March.  Surgery was postponed because of the COVID-19 pandemic.  I spoke with the patient over the telephone on Dec 27, 2018 and he returns to the office today for further follow-up.  He states that he went back to work in early April.  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  He states that he does admit to exertional shortness of breath but this occurs only with strenuous physical exertion.  He does not get short of breath with ordinary activity.  He states that for the most part he thinks he is staying in rhythm although he knows that every once while he feels rapid palpitations.  He was given a prescription for Xarelto for anticoagulation but he never filled his prescription because of the high cost.  He states that he stopped taking metoprolol for a while but he went back to taking one half of the pill daily.  He is otherwise not taking any medications.  He returns to the office today to discuss treatment options further.  He denies any fevers or productive cough.  He has not been exposed to any persons with known or suspected COVID-19 infection.  The remainder of his review of systems is unchanged from previously.   Current Outpatient Medications  Medication Sig Dispense Refill  . albuterol (PROVENTIL HFA;VENTOLIN HFA) 108 (90  Base) MCG/ACT inhaler Inhale 1-2 puffs into the lungs every 6 (six) hours as needed for wheezing or shortness of breath.     Marland Kitchen amoxicillin (AMOXIL) 500 MG capsule Take 4 capsules 45 minutes prior to dental procedures. (Patient taking differently: Take 2,000 mg by mouth See admin instructions. Take 2000 mg by mouth 45 minutes prior to dental procedures.) 20 capsule 0  . esomeprazole (NEXIUM) 40 MG capsule Take 40 mg by mouth at bedtime.     . finasteride (PROSCAR) 5 MG tablet Take 5 mg by mouth at bedtime.     . fluticasone (FLONASE) 50 MCG/ACT nasal spray Place 1 spray into both nostrils daily.     . fluticasone-salmeterol (ADVAIR HFA) 115-21 MCG/ACT inhaler Inhale 2 puffs into the lungs daily as needed (shortness of breath).     . metoprolol tartrate (LOPRESSOR) 25 MG tablet Take 0.5 tablets (12.5 mg total) by mouth 2 (two) times daily. 60 tablet 11  . montelukast (SINGULAIR) 10 MG tablet Take 10 mg by mouth at bedtime.     . Omega-3 Fatty Acids (FISH OIL) 1000 MG CAPS Take 1,000 mg by mouth daily.    . simvastatin (ZOCOR) 40 MG tablet Take 40 mg by mouth at bedtime.     . tamsulosin (FLOMAX) 0.4 MG CAPS capsule Take 0.4 mg by mouth daily.    . rivaroxaban (XARELTO) 20 MG TABS  tablet Take 1 tablet (20 mg total) by mouth daily with supper. (Patient not taking: Reported on 01/22/2019) 90 tablet 1   No current facility-administered medications for this visit.       Physical Exam:   BP 123/77 (BP Location: Right Arm, Patient Position: Sitting, Cuff Size: Normal)   Pulse 81   Temp (!) 97.5 F (36.4 C) (Skin)   Resp 16   Ht  (1.854 m)   Wt 186 lb (84.4 kg)   SpO2 97% Comment: RA  BMI 24.54 kg/m   General:  Well-appearing  Chest:   Clear to auscultation  CV:   Regular rate and rhythm with prominent holosystolic murmur  Incisions:  n/a  Abdomen:  Soft nontender  Extremities:  Warm and well-perfused  Diagnostic Tests:  n/a  Impression:  Patient has mitral valve prolapse with  stage D severe symptomatic primary mitral regurgitation and recently diagnosed paroxysmal atrial fibrillation. He reports symptoms of mild exertional shortness of breath, decreased exercise tolerance, and a single dizzy spell following exertion. He has a long history of intermittent atypical chest pain and palpitations, and after he was found to have paroxysmal atrial fibrillation during his hospital stay in early January he reports that he believes he has had similar episodes of atrial fibrillation for several years. I personally reviewed the patient's most recent transthoracic and transesophageal echocardiograms, diagnostic cardiac catheterization, and CT angiogram. Patient has mitral valve prolapse with an obvious flail segment involving a portion of the middle scallop of the posterior leaflet. There is severe mitral regurgitation. The jet of regurgitation is eccentric, which explains why it was underestimated on transthoracic echocardiogram. Left ventricular systolic function remains normal. I agree the patient would likely benefit from elective mitral valve repair. Based upon review of the patient's TEE I feel there is a high likelihood that his valve should be repairable with very low associated operative risk.   Diagnostic cardiac catheterization is notable for the absence of significant coronary artery disease.  Right heart pressures were normal.  CT angiography reveals no contraindication to peripheral cannulation for surgery.   Plan:  The patient was again counseled at length regarding the indications, risks and potential benefits of mitral valve repair and maze procedure.  The rationale for elective surgery has been explained, including a comparison between surgery and continued medical therapy with close follow-up.  The likelihood of successful and durable mitral valve repair has been discussed with particular reference to the findings of their recent echocardiogram.  Based upon these  findings and previous experience, I have quoted them a greater than 95 percent likelihood of successful valve repair with less than 1 percent risk of mortality or major morbidity.  The patient remains reluctant to proceed with surgery but is agreeable and tentatively making plans for surgery on March 07, 2019.  We also discussed the rationale for anticoagulation given his history of recurrent paroxysmal atrial fibrillation.  He explains why he never filled his prescription and I personally have communicated this information to Dr. Dulce Sellar via telephone.  The patient will return to our office for follow-up prior to surgery on March 05, 2019.  He has been instructed to stop taking Xarelto 7 days prior to surgery.  All of his questions have been addressed.   I spent in excess of 30 minutes during the conduct of this office consultation and >50% of this time involved direct face-to-face encounter with the patient for counseling and/or coordination of their care.   Salvatore Decent. Cornelius Moras, MD  01/22/2019 10:24 AM

## 2019-01-22 NOTE — Patient Instructions (Signed)
Take all currently prescribed medications as previously ordered - discuss your prescription for Xarelto with Dr Dulce Sellar  Stop taking Xarelto 7 days prior to surgery  Continue taking all other medications without change through the day before surgery.  Have nothing to eat or drink after midnight the night before surgery.  On the morning of surgery take only metoprolol with a sip of water.

## 2019-03-05 ENCOUNTER — Ambulatory Visit (HOSPITAL_BASED_OUTPATIENT_CLINIC_OR_DEPARTMENT_OTHER)
Admission: RE | Admit: 2019-03-05 | Discharge: 2019-03-05 | Disposition: A | Discharge status: Home / Self Care | Payer: Medicare Other | Source: Ambulatory Visit | Attending: Thoracic Surgery (Cardiothoracic Vascular Surgery) | Admitting: Thoracic Surgery (Cardiothoracic Vascular Surgery)

## 2019-03-05 ENCOUNTER — Encounter (HOSPITAL_COMMUNITY): Payer: Self-pay

## 2019-03-05 ENCOUNTER — Encounter: Payer: Self-pay | Admitting: Thoracic Surgery (Cardiothoracic Vascular Surgery)

## 2019-03-05 ENCOUNTER — Ambulatory Visit (HOSPITAL_COMMUNITY)
Admission: RE | Admit: 2019-03-05 | Discharge: 2019-03-05 | Disposition: A | Discharge status: Home / Self Care | Payer: Medicare Other | Source: Ambulatory Visit | Attending: Thoracic Surgery (Cardiothoracic Vascular Surgery) | Admitting: Thoracic Surgery (Cardiothoracic Vascular Surgery)

## 2019-03-05 ENCOUNTER — Ambulatory Visit (INDEPENDENT_AMBULATORY_CARE_PROVIDER_SITE_OTHER): Payer: Medicare Other | Admitting: Thoracic Surgery (Cardiothoracic Vascular Surgery)

## 2019-03-05 ENCOUNTER — Other Ambulatory Visit (HOSPITAL_COMMUNITY)
Admission: RE | Admit: 2019-03-05 | Discharge: 2019-03-05 | Disposition: A | Discharge status: Home / Self Care | Payer: Medicare Other | Source: Ambulatory Visit | Attending: Thoracic Surgery (Cardiothoracic Vascular Surgery) | Admitting: Thoracic Surgery (Cardiothoracic Vascular Surgery)

## 2019-03-05 ENCOUNTER — Encounter (HOSPITAL_COMMUNITY)
Admission: RE | Admit: 2019-03-05 | Discharge: 2019-03-05 | Disposition: A | Discharge status: Home / Self Care | Payer: Medicare Other | Source: Ambulatory Visit | Attending: Orthopedic Surgery | Admitting: Orthopedic Surgery

## 2019-03-05 ENCOUNTER — Other Ambulatory Visit: Payer: Self-pay

## 2019-03-05 VITALS — BP 118/71 | HR 59 | Temp 97.5°F | Resp 20 | Ht 73.0 in | Wt 186.0 lb

## 2019-03-05 DIAGNOSIS — Z01818 Encounter for other preprocedural examination: Secondary | ICD-10-CM | POA: Insufficient documentation

## 2019-03-05 DIAGNOSIS — I4891 Unspecified atrial fibrillation: Secondary | ICD-10-CM | POA: Insufficient documentation

## 2019-03-05 DIAGNOSIS — I340 Nonrheumatic mitral (valve) insufficiency: Secondary | ICD-10-CM

## 2019-03-05 DIAGNOSIS — Z1159 Encounter for screening for other viral diseases: Secondary | ICD-10-CM | POA: Insufficient documentation

## 2019-03-05 DIAGNOSIS — I341 Nonrheumatic mitral (valve) prolapse: Secondary | ICD-10-CM

## 2019-03-05 DIAGNOSIS — I480 Paroxysmal atrial fibrillation: Secondary | ICD-10-CM | POA: Diagnosis not present

## 2019-03-05 HISTORY — DX: Prediabetes: R73.03

## 2019-03-05 HISTORY — DX: Unspecified cataract: H26.9

## 2019-03-05 LAB — SURGICAL PCR SCREEN
MRSA, PCR: NEGATIVE
Staphylococcus aureus: NEGATIVE

## 2019-03-05 LAB — CBC
HCT: 42.5 % (ref 39.0–52.0)
Hemoglobin: 13.9 g/dL (ref 13.0–17.0)
MCH: 30.2 pg (ref 26.0–34.0)
MCHC: 32.7 g/dL (ref 30.0–36.0)
MCV: 92.2 fL (ref 80.0–100.0)
Platelets: 148 10*3/uL — ABNORMAL LOW (ref 150–400)
RBC: 4.61 MIL/uL (ref 4.22–5.81)
RDW: 12.1 % (ref 11.5–15.5)
WBC: 6.9 10*3/uL (ref 4.0–10.5)
nRBC: 0 % (ref 0.0–0.2)

## 2019-03-05 LAB — COMPREHENSIVE METABOLIC PANEL
ALT: 10 U/L (ref 0–44)
AST: 20 U/L (ref 15–41)
Albumin: 3.6 g/dL (ref 3.5–5.0)
Alkaline Phosphatase: 48 U/L (ref 38–126)
Anion gap: 9 (ref 5–15)
BUN: 17 mg/dL (ref 8–23)
CO2: 20 mmol/L — ABNORMAL LOW (ref 22–32)
Calcium: 8.9 mg/dL (ref 8.9–10.3)
Chloride: 108 mmol/L (ref 98–111)
Creatinine, Ser: 0.98 mg/dL (ref 0.61–1.24)
GFR calc Af Amer: >60 mL/min (ref >60)
GFR calc non Af Amer: >60 mL/min (ref >60)
Glucose, Bld: 100 mg/dL — ABNORMAL HIGH (ref 70–99)
Potassium: 4.5 mmol/L (ref 3.5–5.1)
Sodium: 137 mmol/L (ref 135–145)
Total Bilirubin: 0.6 mg/dL (ref 0.3–1.2)
Total Protein: 6 g/dL — ABNORMAL LOW (ref 6.5–8.1)

## 2019-03-05 LAB — HEMOGLOBIN A1C
Hgb A1c MFr Bld: 6 % — ABNORMAL HIGH (ref 4.8–5.6)
Mean Plasma Glucose: 125.5 mg/dL

## 2019-03-05 LAB — TYPE AND SCREEN
ABO/RH(D): A POS
Antibody Screen: NEGATIVE

## 2019-03-05 LAB — PROTIME-INR
INR: 1 (ref 0.8–1.2)
Prothrombin Time: 13.5 seconds (ref 11.4–15.2)

## 2019-03-05 LAB — URINALYSIS, ROUTINE W REFLEX MICROSCOPIC
Bacteria, UA: NONE SEEN
Bilirubin Urine: NEGATIVE
Glucose, UA: NEGATIVE mg/dL
Ketones, ur: NEGATIVE mg/dL
Leukocytes,Ua: NEGATIVE
Nitrite: NEGATIVE
Protein, ur: NEGATIVE mg/dL
Specific Gravity, Urine: 1.018 (ref 1.005–1.030)
pH: 7 (ref 5.0–8.0)

## 2019-03-05 LAB — ABO/RH: ABO/RH(D): A POS

## 2019-03-05 LAB — APTT: aPTT: 29 seconds (ref 24–36)

## 2019-03-05 NOTE — Progress Notes (Signed)
Pre cabg has been completed.   Preliminary results in CV Proc.   Alejandro Burnett 03/05/2019 1:38 PM

## 2019-03-05 NOTE — Progress Notes (Signed)
[address]       [place] [id number]             [phone]     CARDIOTHORACIC SURGERY OFFICE NOTE  Referring Provider is Baldo DaubMunley, Brian J, MD PCP is Ignacia PalmaBeck, Mark C., MD   HPI:  Patient returns to the office today with plans to proceed with minimally invasive mitral valve repair and Maze procedure on Wednesday March 07, 2019.  He was last seen here in our office on January 22, 2019.  He reports no new problems or complaints.  He specifically denies symptoms of exertional shortness of breath, chest discomfort, or palpitations.  He has not had fevers, chills, productive cough.  He has not been exposed to any persons with known or suspected COVID-19 infection.  He stopped taking Xarelto last week in anticipation of surgery.   Current Outpatient Medications  Medication Sig Dispense Refill   albuterol (PROVENTIL HFA;VENTOLIN HFA) 108 (90 Base) MCG/ACT inhaler Inhale 1-2 puffs into the lungs every 6 (six) hours as needed for wheezing or shortness of breath.      amoxicillin (AMOXIL) 500 MG capsule Take 4 capsules 45 minutes prior to dental procedures. (Patient taking differently: Take 2,000 mg by mouth See admin instructions. Take 2000 mg by mouth 45 minutes prior to dental procedures.) 20 capsule 0   esomeprazole (NEXIUM) 40 MG capsule Take 40 mg by mouth at bedtime.      finasteride (PROSCAR) 5 MG tablet Take 5 mg by mouth at bedtime.      fluorouracil (EFUDEX) 5 % cream Apply 1 application topically 2 (two) times daily as needed (skin spots).     fluticasone (FLONASE) 50 MCG/ACT nasal spray Place 1 spray into both nostrils daily as needed for allergies or rhinitis.      fluticasone-salmeterol (ADVAIR HFA) 230-21 MCG/ACT inhaler Inhale 2 puffs into the lungs 2 (two) times daily as needed (shortness of breath).     ibuprofen (ADVIL) 200 MG tablet Take 400 mg by mouth daily as needed for headache or moderate pain.     Ketotifen Fumarate (ALLERGY EYE DROPS OP) Place 1  drop into both eyes daily as needed (allergies).     metFORMIN (GLUCOPHAGE) 500 MG tablet Take 500 mg by mouth daily.     metoprolol tartrate (LOPRESSOR) 25 MG tablet Take 0.5 tablets (12.5 mg total) by mouth 2 (two) times daily. 60 tablet 11   Misc Natural Products (LUTEIN 20) CAPS Take 20 mg by mouth daily.     montelukast (SINGULAIR) 10 MG tablet Take 10 mg by mouth daily.      Multiple Vitamin (MULTIVITAMIN WITH MINERALS) TABS tablet Take 1 tablet by mouth daily.     Omega-3 Fatty Acids (FISH OIL) 1200 MG CAPS Take 1,200 mg by mouth daily.     silver sulfADIAZINE (SILVADENE) 1 % cream Apply 1 application topically daily as needed (eczema).     simvastatin (ZOCOR) 40 MG tablet Take 40 mg by mouth at bedtime.      triamcinolone cream (KENALOG) 0.1 % Apply 1 application topically daily as needed (eczema). Mixed with eucerin     rivaroxaban (XARELTO) 20 MG TABS tablet Take 1 tablet (20 mg total) by mouth daily with supper for 30 days. (Patient not taking: Reported on 03/05/2019) 30 tablet 0   No current facility-administered medications for this visit.       Physical Exam:   BP 118/71    Pulse (!) 59  Temp (!) 97.5 F (36.4 C) (Skin)    Resp 20    Ht 6\' 1"  (1.854 m)    Wt 186 lb (84.4 kg)    SpO2 95% Comment: RA   BMI 24.54 kg/m   General:  Well-appearing  Chest:   Clear to auscultation  CV:   Regular rate and rhythm with prominent holosystolic murmur  Incisions:  n/a  Abdomen:  Soft nontender  Extremities:  Warm and well-perfused  Diagnostic Tests:  n/a   Impression:  Patient has mitral valve prolapse with stage D severe symptomatic primary mitral regurgitationand recently diagnosed paroxysmal atrial fibrillation. [place]ymptoms of mildexertional shortness of breath, decreased exercise tolerance, and a single dizzy spell following exertion. He has a long history of intermittent atypical chest pain and palpitations,and after he was found to have paroxysmal  atrial fibrillation during his hospital stay in early January he reports that he believes he has had similar episodes of atrial fibrillation for several years. I personally reviewed the patient's most recent transthoracic and transesophageal echocardiograms,diagnostic cardiac catheterization, and CT angiogram. Patient has mitral valve prolapse with an obvious flail segment involving a portion of the middle scallop of the posterior leaflet. There is severe mitral regurgitation. The jet of regurgitation is eccentric, which explains why it was underestimated on transthoracic echocardiogram. Left ventricular systolic function remains normal. I agree the patient would likely benefit from elective mitral valve repair. Based upon review of the patient's TEE I feel there is a high likelihood that his valve should be repairable with very low associated operative risk.Diagnostic cardiac catheterization is notable for the absence of significant coronary artery disease. Right heart pressures were normal. CT angiography reveals no contraindication to peripheral cannulation for surgery.    Plan:  The patient was again counseled at length regarding the indications, risks and potential benefits of mitral valve repair and maze procedure.  The rationale for elective surgery has been explained, including a comparison between surgery and continued medical therapy with close follow-up.  The likelihood of successful and durable mitral valve repair has been discussed with particular reference to the findings of their recent echocardiogram.  Based upon these findings and previous experience, I have quoted them a greater than 95 percent likelihood of successful valve repair with less than 1 percent risk of mortality or major morbidity.  Alternative surgical approaches have been discussed including a comparison between conventional sternotomy and minimally-invasive techniques.  The relative risks and benefits of each have  been reviewed as they pertain to the patient's specific circumstances, and expectations for the patient's postoperative convalescence has been discussed.  The relative risks and benefits of performing a maze procedure at the time of his surgery was discussed at length, including the expected likelihood of long term freedom from recurrent symptomatic atrial fibrillation and/or atrial flutter.    The patient understands and accepts all potential risks of surgery including but not limited to risk of death, stroke or other neurologic complication, myocardial infarction, congestive heart failure, respiratory failure, renal failure, bleeding requiring transfusion and/or reexploration, arrhythmia, infection or other wound complications, pneumonia, pleural and/or pericardial effusion, pulmonary embolus, aortic dissection or other major vascular complication, or delayed complications related to valve repair or replacement including but not limited to structural valve deterioration and failure, thrombosis, embolization, endocarditis, or paravalvular leak.  Specific risks potentially related to the minimally-invasive approach were discussed at length, including but not limited to risk of conversion to full or partial sternotomy, aortic dissection or other major vascular complication, unilateral  acute lung injury or pulmonary edema, phrenic nerve dysfunction or paralysis, rib fracture, chronic pain, lung hernia, or lymphocele. All of his questions have been answered.    I spent in excess of 15 minutes during the conduct of this office consultation and >50% of this time involved direct face-to-face encounter with the patient for counseling and/or coordination of their care.   Salvatore Decentlarence H. Cornelius Moraswen, MD 03/05/2019 3:08 PM

## 2019-03-05 NOTE — Progress Notes (Addendum)
Pt denies SOB and chest pain. Pt stated that he is under the care of Dr. Bettina Gavia, Cardiology. Pt denies having a stress test. Pt denies having an EKG within the last month, a chest x ray within the last 2 weeks and labs within the last 7 day. Pt stated that he took his last dose of Xarelto last Tuesday. Pt stated that he does not check his blood glucose. Pt stated that his PCP is Dr. Glendale Chard at [facility].  Pt denies that he and family members have tested positive for COVID-19 ( pt stated that he is scheduled to be tested on 03/05/19 and pt reminded to quarantine ).   Coronavirus Screening  Pt denies that he and family members experienced the following symptoms:  Cough yes/no: No Fever (>100.83F)  yes/no: No Runny nose yes/no: No Sore throat yes/no: No Difficulty breathing/shortness of breath  yes/no: No  Have you or a family member traveled in the last 14 days and where? yes/no: No  Pt reminded that hospital visitation restrictions are in effect and the importance of the restrictions.   Pt verbalized understanding of all pre-op instructions.  Pt chart forwarded to PA, Anesthesiology, for review.

## 2019-03-05 NOTE — Patient Instructions (Signed)
  Continue taking all current medications without change through the day before surgery.  Have nothing to eat or drink after midnight the night before surgery.  On the morning of surgery take only Nexium with a sip of water.    

## 2019-03-05 NOTE — H&P (Addendum)
[address]       [name] [address]             [phone]          CARDIOTHORACIC SURGERY HISTORY AND PHYSICAL EXAM  Referring Provider is Baldo Daub, MD PCP is [name], MD      Chief Complaint  Patient presents with   Mitral Regurgitation    Surgical eval, TEE 08/24/18, ECHO 06/27/18    HPI:  Patient is a 68 year old male referred for surgical consultation to discuss treatment options for management of mitral valve prolapse with severe mitral regurgitation.  Patient states that he has been told that he had a heart murmur on physical exam for at least the past 10 years.  He states that this was initially discovered at his primary care physician's office and his murmur was reportedly difficult to appreciate.  Over time the murmur has gotten louder.  In 2016 he presented with symptoms of atypical chest pain.  Echocardiogram at that time reportedly revealed mitral valve prolapse with moderate mitral regurgitation.  Diagnostic cardiac catheterization was performed and reportedly revealed normal coronary artery anatomy.  The patient has been followed intermittently ever since by Dr. Dulce Sellar.  Routine follow-up echocardiogram performed June 27, 2018 revealed normal left ventricular systolic function.  There was mitral valve prolapse with what was felt to be mild mitral regurgitation.  Recently the patient was started on oral Flomax because of benign prostatic hypertrophy.  Shortly after that he began to experience occasional dizzy spells and worsening symptoms of shortness of breath.  He reported these symptoms to Dr. Dulce Sellar when he was seen in follow-up on August 08, 2018.  Because of progressive symptoms and prominent holosystolic murmur noted on physical exam, TEE was recommended.  Patient underwent TEE on August 24, 2018 which revealed mitral valve prolapse with a flail segment involving a portion of the posterior leaflet and severe mitral  regurgitation.  Cardiothoracic surgical consultation was requested.  Patient is married but his wife is severely debilitated from Alzheimer's type dementia and lives in a nursing home.  He has a close friend who spends time with who accompanies him to our office for his consultation visit today.  The patient has been retired for several years but remains remarkably active physically.  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  He has remained quite active physically all of his adult life.  He still performs vigorous physical exertion on a daily basis.  He only recently experienced symptoms of exertional shortness of breath with increased palpitations and 1 episode of dizziness.  He states that the symptoms have all improved since he quit taking Flomax.  He denies any history of resting shortness of breath, PND, orthopnea, or lower extremity edema.  He has never had any exertional chest pain or chest tightness.  Patient is a 68 year old male who returns to the office today for follow-up of mitral valve prolapse with stage D severe symptomatic primary mitral regurgitation and recurrent paroxysmal atrial fibrillation.  He was originally seen in consultation on September 18, 2018.  During hospitalization for diagnostic cardiac catheterization he was noted to be going in and out of paroxysmal atrial fibrillation, and more recently he has been anticoagulated using Xarelto.  We made tentative plans to proceed with minimally invasive mitral valve repair and Maze procedure in March, but surgery was postponed because  of the COVID-19 pandemic.  He was last seen here in our office on January 22, 2019.  He reports no new problems or complaints.  He specifically denies symptoms of exertional shortness of breath, chest discomfort, or palpitations.  He has not had fevers, chills, productive cough.  He has not been exposed to any persons with known or  suspected COVID-19 infection.  He stopped taking Xarelto last week in anticipation of surgery.   Past Medical History:  Diagnosis Date   Asthma 11/04/2015   Benign prostatic hyperplasia with urinary obstruction 04/17/2014   BPH (benign prostatic hyperplasia) 11/04/2015   Dyslipidemia 11/04/2015   Early cataracts, bilateral    Gastroesophageal reflux disease 04/17/2014   Hematuria 04/17/2014   Microscopic   Hyperlipemia 04/17/2014   Migraine headache 04/17/2014   Migraines 11/04/2015   Mitral regurgitation 11/04/2015   Mitral valve prolapse 11/04/2015   Nephrolithiasis 11/04/2015   Non-rheumatic mitral regurgitation 10/19/2016   Moderate   Normal coronary arteries 10/10/2017   Paroxysmal atrial fibrillation (HCC)    Pre-diabetes     Past Surgical History:  Procedure Laterality Date   APPENDECTOMY     BACK SURGERY     COLONOSCOPY     RIGHT/LEFT HEART CATH AND CORONARY ANGIOGRAPHY N/A 10/09/2018   Procedure: RIGHT/LEFT HEART CATH AND CORONARY ANGIOGRAPHY;  Surgeon: Tonny Bollman, MD;  Location: [facility] INVASIVE CV LAB;  Service: Cardiovascular;  Laterality: N/A;   TEE WITHOUT CARDIOVERSION N/A 08/24/2018   Procedure: TRANSESOPHAGEAL ECHOCARDIOGRAM (TEE);  Surgeon: Thurmon Fair, MD;  Location: [facility] ENDOSCOPY;  Service: Cardiovascular;  Laterality: N/A;   TONSILLECTOMY      Family History  Problem Relation Age of Onset   Hypertension Mother    Cancer Father     Social History Social History   Tobacco Use   Smoking status: Former Smoker    Packs/day: 0.50    Types: Cigarettes    Quit date: 1986    Years since quitting: 34.5   Smokeless tobacco: Former Neurosurgeon    Types: Chew  Substance Use Topics   Alcohol use: Never    Frequency: Never   Drug use: Never    Prior to Admission medications   Medication Sig Start Date End Date Taking? Authorizing Provider  albuterol (PROVENTIL HFA;VENTOLIN HFA) 108 (90 Base) MCG/ACT inhaler Inhale 1-2 puffs into the lungs  every 6 (six) hours as needed for wheezing or shortness of breath.    Yes [provider]  amoxicillin (AMOXIL) 500 MG capsule Take 4 capsules 45 minutes prior to dental procedures. Patient taking differently: Take 2,000 mg by mouth See admin instructions. Take 2000 mg by mouth 45 minutes prior to dental procedures. 05/15/18  Yes Baldo Daub, MD  esomeprazole (NEXIUM) 40 MG capsule Take 40 mg by mouth at bedtime.  11/20/15  Yes [provider]  finasteride (PROSCAR) 5 MG tablet Take 5 mg by mouth at bedtime.  05/05/17  Yes [provider]  fluorouracil (EFUDEX) 5 % cream Apply 1 application topically 2 (two) times daily as needed (skin spots).   Yes [provider]  fluticasone (FLONASE) 50 MCG/ACT nasal spray Place 1 spray into both nostrils daily as needed for allergies or rhinitis.    Yes [provider]  fluticasone-salmeterol (ADVAIR HFA) 230-21 MCG/ACT inhaler Inhale 2 puffs into the lungs 2 (two) times daily as needed (shortness of breath).   Yes [provider]  ibuprofen (ADVIL) 200 MG tablet Take 400 mg by mouth daily as needed for headache or  moderate pain.   Yes [provider]  Ketotifen Fumarate (ALLERGY EYE DROPS OP) Place 1 drop into both eyes daily as needed (allergies).   Yes [provider]  metFORMIN (GLUCOPHAGE) 500 MG tablet Take 500 mg by mouth daily.   Yes [provider]  metoprolol tartrate (LOPRESSOR) 25 MG tablet Take 0.5 tablets (12.5 mg total) by mouth 2 (two) times daily. 10/09/18 10/09/19 Yes Tonny Bollmanooper, Michael, MD  Misc Natural Products (LUTEIN 20) CAPS Take 20 mg by mouth daily.   Yes [provider]  montelukast (SINGULAIR) 10 MG tablet Take 10 mg by mouth daily.    Yes [provider]  Multiple Vitamin (MULTIVITAMIN WITH MINERALS) TABS tablet Take 1 tablet by mouth daily.   Yes [provider]  Omega-3 Fatty Acids (FISH OIL) 1200 MG CAPS Take 1,200 mg by mouth  daily.   Yes [provider]  rivaroxaban (XARELTO) 20 MG TABS tablet Take 1 tablet (20 mg total) by mouth daily with supper for 30 days. Patient not taking: Reported on 03/05/2019 01/22/19 02/27/19 Yes Baldo DaubMunley, Brian J, MD  silver sulfADIAZINE (SILVADENE) 1 % cream Apply 1 application topically daily as needed (eczema).   Yes [provider]  simvastatin (ZOCOR) 40 MG tablet Take 40 mg by mouth at bedtime.    Yes [provider]  triamcinolone cream (KENALOG) 0.1 % Apply 1 application topically daily as needed (eczema). Mixed with eucerin   Yes [provider]    Allergies  Allergen Reactions   Apple     Migraines   Banana     Migraines   Peanut-Containing Drug Products     Migraines    Strawberry (Diagnostic)     Migraines   Tamsulosin     Goes into afib      Review of Systems:              General:                      normal appetite, normal energy, no weight gain, no weight loss, no fever             Cardiac:                       no chest pain with exertion, no chest pain at rest, +SOB with strenuous exertion, no resting SOB, no PND, no orthopnea, no palpitations, no arrhythmia, no atrial fibrillation, no LE edema, one single dizzy spell, no syncope             Respiratory:                 no shortness of breath, no home oxygen, no productive cough, no dry cough, no bronchitis, no wheezing, no hemoptysis, no asthma, no pain with inspiration or cough, no sleep apnea, no CPAP at night             GI:                               no difficulty swallowing, + reflux, no frequent heartburn, no hiatal hernia, no abdominal pain, no constipation, no diarrhea, no hematochezia, no hematemesis, no melena             GU:  no dysuria,  no frequency, no urinary tract infection, + hematuria, + enlarged prostate, no kidney stones, no kidney disease             Vascular:                     no pain suggestive of claudication, no pain  in feet, no leg cramps, + varicose veins, no DVT, no non-healing foot ulcer             Neuro:                         no stroke, no TIA's, no seizures, no headaches, no temporary blindness one eye,  no slurred speech, no peripheral neuropathy, no chronic pain, no instability of gait, no memory/cognitive dysfunction             Musculoskeletal:         no arthritis, no joint swelling, no myalgias, no difficulty walking, normal mobility              Skin:                            + rash, no itching, no skin infections, no pressure sores or ulcerations             Psych:                         no anxiety, no depression, no nervousness, no unusual recent stress             Eyes:                           no blurry vision, no floaters, no recent vision changes, + wears glasses or contacts             ENT:                            no hearing loss, no loose or painful teeth, no dentures, last saw dentist December 2019             Hematologic:               no easy bruising, no abnormal bleeding, no clotting disorder, no frequent epistaxis             Endocrine:                   no diabetes, does not check CBG's at home                           Physical Exam:              BP 122/74 (BP Location: Right Arm, Patient Position: Sitting, Cuff Size: Large)    Pulse 80    Resp 16    Ht 6\' 2"  (1.88 m)    Wt 185 lb (83.9 kg)    SpO2 95% Comment: RA   BMI 23.75 kg/m              General:                      Thin,  well-appearing  HEENT:                       Unremarkable              Neck:                           no JVD, no bruits, no adenopathy              Chest:                          clear to auscultation, symmetrical breath sounds, no wheezes, no rhonchi              CV:                              RRR, grade III/VI holosystolic murmur              Abdomen:                    soft, non-tender, no masses              Extremities:                 warm, well-perfused, pulses palpable,  no LE edema             Rectal/GU                   Deferred             Neuro:                         Grossly non-focal and symmetrical throughout             Skin:                            Clean and dry, no rashes, no breakdown   Diagnostic Tests:  Transthoracic Echocardiography  Patient: Benicio, Manna MR #: [id number] Study Date: 06/27/2018 Gender: M Age: 54 Height: 185.4 cm Weight: 88.7 kg BSA: 2.15 m^2 Pt. Status: Room:  REFERRING Ignacia Palma ATTENDING Norman Herrlich, MD ORDERING Norman Herrlich, MD REFERRING Norman Herrlich, MD SONOGRAPHER Lanae Crumbly, RDCS PERFORMING [facility] [place]  cc:  ------------------------------------------------------------------- LV EF: 60% - 65%  ------------------------------------------------------------------- Indications: Mitral valve prolapse 424.0. Mitral regurgitation 424.0.  ------------------------------------------------------------------- History: PMH: Palpitations. Risk factors: Dyslipidemia.  ------------------------------------------------------------------- Study Conclusions  - Left ventricle: The cavity size was normal. Wall thickness was normal. Systolic function was normal. The estimated ejection fraction was in the range of 60% to 65%. Wall motion was normal; there were no regional wall motion abnormalities. - Aortic valve: There was trivial regurgitation. - Mitral valve: Prolapse. Posterior leaflet. There was mild regurgitation. - Left atrium: The atrium was moderately dilated.  Impressions:  - 1. Left ventricular systolic function is preserved visually estimated 60 to 65%. 2. Moderate left atrial enlargement 3. Prolapsed posterior leaflet of the mitral valve with eccentric mild mitral regurgitation directed towards the anteroseptum. 4. Trace aortic  regurgitation.  ------------------------------------------------------------------- Study data: No prior study was available for comparison. Study status: Routine. Procedure: The patient reported no pain pre or post test. Transthoracic echocardiography. Image quality was adequate. Study completion: There were no complications. Transthoracic echocardiography. M-mode, complete  2D, spectral Doppler, and color Doppler. Birthdate: Patient birthdate: 03-03-1951. Age: Patient is 68 yr old. Sex: Gender: male. BMI: 25.8 kg/m^2. Blood pressure: 118/78 Patient status: Outpatient. Study date: Study date: 06/27/2018. Study time: 10:11 AM. Location: Echo laboratory.  -------------------------------------------------------------------  ------------------------------------------------------------------- Left ventricle: The cavity size was normal. Wall thickness was normal. Systolic function was normal. The estimated ejection fraction was in the range of 60% to 65%. Wall motion was normal; there were no regional wall motion abnormalities.  ------------------------------------------------------------------- Aortic valve: Trileaflet; normal thickness leaflets. Mobility was not restricted. Doppler: Transvalvular velocity was within the normal range. There was no stenosis. There was trivial regurgitation.  ------------------------------------------------------------------- Aorta: Aortic root: The aortic root was normal in size.  ------------------------------------------------------------------- Mitral valve: Mobility was not restricted. Prolapse. Posterior leaflet. Doppler: Transvalvular velocity was within the normal range. There was no evidence for stenosis. There was mild regurgitation. Valve area by pressure half-time: 3.49 cm^2. Indexed valve area by pressure half-time: 1.63 cm^2/m^2. Peak gradient (D): 5 mm  Hg.  ------------------------------------------------------------------- Left atrium: The atrium was moderately dilated.  ------------------------------------------------------------------- Right ventricle: The cavity size was normal. Wall thickness was normal. Systolic function was normal.  ------------------------------------------------------------------- Pulmonic valve: Doppler: Transvalvular velocity was within the normal range. There was no evidence for stenosis.  ------------------------------------------------------------------- Tricuspid valve: Structurally normal valve. Doppler: Transvalvular velocity was within the normal range. There was no regurgitation.  ------------------------------------------------------------------- Pulmonary artery: The main pulmonary artery was normal-sized. Systolic pressure was within the normal range.  ------------------------------------------------------------------- Right atrium: The atrium was normal in size.  ------------------------------------------------------------------- Pericardium: There was no pericardial effusion.  ------------------------------------------------------------------- Systemic veins: Inferior vena cava: The vessel was normal in size.  ------------------------------------------------------------------- Measurements  Left ventricle Value Reference LV ID, ED, PLAX chordal (H) 62 mm 43 - 52 LV ID, ES, PLAX chordal (H) 39 mm 23 - 38 LV fx shortening, PLAX chordal 37 % >=29 LV PW thickness, ED 9 mm ---------- IVS/LV PW ratio, ED 1 <=1.3 LV e&', lateral 7.07 cm/s ---------- LV E/e&', lateral 16.12 ---------- LV e&', medial 7.94 cm/s  ---------- LV E/e&', medial 14.36 ---------- LV e&', average 7.51 cm/s ---------- LV E/e&', average 15.19 ----------  Ventricular septum Value Reference IVS thickness, ED 9 mm ----------  LVOT Value Reference LVOT peak velocity, S 114 cm/s ---------- LVOT mean velocity, S 80.1 cm/s ---------- LVOT VTI, S 26.1 cm ---------- LVOT peak gradient, S 5 mm Hg ----------  Aorta Value Reference Aortic root ID, ED 38 mm ---------- Ascending aorta ID, A-P, S 39 mm ----------  Left atrium Value Reference LA ID, A-P, ES 47 mm ---------- LA ID/bsa, A-P 2.19 cm/m^2 <=2.2 LA volume, S 113 ml ---------- LA volume/bsa, S 52.6 ml/m^2 ---------- LA volume, ES, 1-p A4C 102 ml ---------- LA volume/bsa, ES, 1-p A4C 47.5 ml/m^2 ---------- LA volume, ES, 1-p A2C 119 ml ---------- LA volume/bsa, ES, 1-p A2C 55.4 ml/m^2 ----------  Mitral valve Value Reference Mitral E-wave peak velocity 114 cm/s ---------- Mitral A-wave peak velocity 79.1 cm/s ---------- Mitral deceleration time 215 ms 150 - 230 Mitral pressure half-time 63 ms ---------- Mitral peak gradient, D 5 mm Hg ---------- Mitral E/A ratio, peak 1.4  ---------- Mitral valve area, PHT, DP 3.49 cm^2 ---------- Mitral valve area/bsa, PHT, DP 1.63 cm^2/m^2 ----------  Pulmonary arteries Value Reference PA pressure, S, DP 22 mm Hg <=30  Tricuspid valve Value Reference Tricuspid regurg peak velocity 220 cm/s ---------- Tricuspid peak RV-RA gradient 19 mm Hg ----------  Right atrium Value Reference RA ID, S-I, ES, A4C (  H) 64.2 mm 34 - 49 RA area, ES, A4C 19.5 cm^2 8.3 - 19.5 RA volume, ES, A/L 50.6 ml ---------- RA volume/bsa, ES, A/L 23.6 ml/m^2 ----------  Systemic veins Value Reference Estimated CVP 3 mm Hg ----------  Right ventricle Value Reference TAPSE 25.8 mm ---------- RV pressure, S, DP 22 mm Hg <=30 RV s&', lateral, S 11.6 cm/s ----------  Legend: (L) and (H) mark values outside specified reference range.  ------------------------------------------------------------------- Prepared and Electronically Authenticated by  Belva Crome, MD 2019-11-05T12:39:13   Transesophageal Echocardiography  Patient: Gunter, Conde MR #: [id number] Study Date: 08/24/2018 Gender: M Age: 41 Height: Weight: BSA: Pt. Status: Room:  Sherilyn Banker, MD REFERRING Norman Herrlich, MD SONOGRAPHER Lavenia Atlas, RCS ADMITTING Thurmon Fair, MD ATTENDING Thurmon Fair, MD PERFORMING Thurmon Fair, MD  cc:  ------------------------------------------------------------------- LV  EF: 60% - 65%  ------------------------------------------------------------------- Indications: Mitral regurgitation 424.0.  ------------------------------------------------------------------- History: PMH: Mitral valve disease.  ------------------------------------------------------------------- Study Conclusions  - Left ventricle: Systolic function was normal. The estimated ejection fraction was in the range of 60% to 65%. Wall motion was normal; there were no regional wall motion abnormalities. - Mitral valve: Mild diffuse thickening, consistent with myxomatous proliferation. Moderate, late systolicprolapse, involving the middle scallop of the posterior leaflet. Moderate flail motion involving the middle scallop (P2) of the posterior leaflet due to rupture of one or more chords. There was moderate to severe regurgitation directed eccentrically and toward the septum. There is no evidence of systolic pulmonary flow reversal. - Left atrium: No evidence of thrombus in the atrial cavity or appendage. - Atrial septum: No defect or patent foramen ovale was identified.  ------------------------------------------------------------------- Study data: Study status: Routine. Consent: The risks, benefits, and alternatives to the procedure were explained to the patient and informed consent was obtained. Procedure: The patient reported no pain pre or post test. Initial setup. The patient was brought to the laboratory. Surface ECG leads were monitored. Sedation. Conscious sedation was administered by cardiology staff. Transesophageal echocardiography. Topical anesthesia was obtained using viscous lidocaine. A transesophageal probe was inserted by the attending cardiologistwithout difficulty. Image quality was adequate. Study completion: The patient tolerated the procedure well. There were no complications. Administered medications: Midazolam, ,  IV. Fentanyl, , IV. Diagnostic transesophageal echocardiography. 2D and color Doppler. Birthdate: Patient birthdate: [birth year]. Age: Patient is 68 yr old. Sex: Gender: male. Blood pressure: 111/68 Patient status: Outpatient. Study date: Study date: 08/24/2018. Study time: 11:48 AM. Location: Endoscopy.  -------------------------------------------------------------------  ------------------------------------------------------------------- Left ventricle: Systolic function was normal. The estimated ejection fraction was in the range of 60% to 65%. Wall motion was normal; there were no regional wall motion abnormalities.  ------------------------------------------------------------------- Aortic valve: Structurally normal valve. Trileaflet; normal thickness leaflets. Cusp separation was normal. Doppler: There was trivial regurgitation.  ------------------------------------------------------------------- Aorta: There was no atheroma. There was no evidence for dissection. Aortic root: The aortic root was not dilated. Ascending aorta: The ascending aorta was normal in size. Aortic arch: The aortic arch was normal in size. Descending aorta: The descending aorta was normal in size.  ------------------------------------------------------------------- Mitral valve: Mild diffuse thickening, consistent with myxomatous proliferation. Leaflet separation was normal. Moderate, late systolicprolapse, involving the middle scallop of the posterior leaflet. Moderate flail motion involving the middle scallop of the posterior leaflet due to rupture of one or more chords. The ruptured chord is located towards the medial part of the middle scallop (bordering P3). Doppler: There was moderate to severe regurgitation directed eccentrically and toward the septum.  ------------------------------------------------------------------- Left atrium: The atrium  was normal in size. No evidence of thrombus in the atrial cavity or appendage. The appendage was morphologically a left appendage, multilobulated, and of normal size. Emptying velocity was normal.  ------------------------------------------------------------------- Atrial septum: No defect or patent foramen ovale was identified.  ------------------------------------------------------------------- Right ventricle: The cavity size was normal. Wall thickness was normal. Systolic function was normal.  ------------------------------------------------------------------- Pulmonic valve: Structurally normal valve. Cusp separation was normal. No evidence of vegetation. Doppler: There was trivial regurgitation.  ------------------------------------------------------------------- Tricuspid valve: Structurally normal valve. Leaflet separation was normal. Doppler: There was no significant regurgitation.  ------------------------------------------------------------------- Pulmonary artery: The main pulmonary artery was normal-sized.  ------------------------------------------------------------------- Right atrium: The atrium was normal in size. No evidence of thrombus in the atrial cavity or appendage. The appendage was morphologically a right appendage.  ------------------------------------------------------------------- Pericardium: There was no pericardial effusion.  ------------------------------------------------------------------- Measurements  LVOT Value LVOT ID, S 22.7 mm LVOT area 4.05 cm^2 LVOT peak velocity, S 89.15 cm/s LVOT VTI, S 15.74 cm Stroke volume (SV), LVOT DP 63.7 ml  Mitral valve Value Aliasing velocity, MR PISA 35.1  cm/s Mitral regurg PISA radius 10 mm Mitral maximal regurg velocity, PISA 459.3 cm/s Mitral regurg VTI, PISA 107.81 cm Mitral ERO, PISA 0.48 cm^2 Mitral regurg volume, PISA 51.8 ml Mitral regurg fraction, PISA 44.83 %  Legend: (L) and (H) mark values outside specified reference range.  ------------------------------------------------------------------- Prepared and Electronically Authenticated by  Thurmon Fair, MD 2020-01-02T15:55:25   RIGHT/LEFT HEART CATH AND CORONARY ANGIOGRAPHY  Conclusion   1. Angiographically normal coronary arteries (right dominant) 2. Low intracardiac filling pressures 3. Systemic hypotension during procedure related to conscious sedation - pt remained cognitively intact and stable throughout. BP normalized after sedation wore off.  Recommendations: The patient will continue preoperative evaluation for minimally invasive mitral valve repair. He has normal coronary arteries. He is incidentally noted to be in atrial fibrillation with mildly increased ventricular rate. I will start him on a low-dose of metoprolol and he has scheduled follow-up with Dr. Cornelius Moras next week. Will not start him on anticoagulation since he will have surgery in the near future requiring temporary discontinuation of anticoagulant drugs.  Indications   Severe mitral regurgitation [I34.0 (ICD-10-CM)]  Procedural Details   Technical Details INDICATION: Severe mitral regurgitation, preoperative study  PROCEDURAL DETAILS: There was an indwelling IV in a right antecubital vein. Using normal sterile technique, the IV was changed out for a 5 Fr brachial sheath over a 0.018 inch wire. The right wrist was then prepped, draped, and anesthetized with 1% lidocaine. Using the modified Seldinger technique a 5/6 French Slender sheath was placed in the right radial artery.  Intra-arterial verapamil was administered through the radial artery sheath. IV heparin was administered after a JR4 catheter was advanced into the central aorta. A Swan-Ganz catheter was used for the right heart catheterization. Standard protocol was followed for recording of right heart pressures and sampling of oxygen saturations. Fick cardiac output was calculated. Standard Judkins catheters were used for selective coronary angiography. LV pressure is recorded and an aortic valve pullback is performed. There were no immediate procedural complications. The patient was transferred to the post catheterization recovery area for further monitoring.    Estimated blood loss <50 mL.   During this procedure medications were administered to achieve and maintain moderate conscious sedation while the patient's heart rate, blood pressure, and oxygen saturation were continuously monitored and I was present face-to-face 100% of this time.  Medications  (Filter: Administrations occurring from 10/09/18 1222 to 10/09/18 1329)  Medication Rate/Dose/Volume Action  Date Time   midazolam (VERSED) injection (mg) 2 mg Given 10/09/18 1250   Total dose as of 10/16/18 1542        2 mg        fentaNYL (SUBLIMAZE) injection (mcg) 25 mcg Given 10/09/18 1251   Total dose as of 10/16/18 1542        25 mcg        lidocaine (PF) (XYLOCAINE) 1 % injection (mL) 2 mL Given 10/09/18 1256   Total dose as of 10/16/18 1542 2 mL Given 1258   4 mL        Heparin (Porcine) in NaCl 1000-0.9 UT/500ML-% SOLN (mL) 500 mL Given 10/09/18 1256   Total dose as of 10/16/18 1542 500 mL Given 1257   1,000 mL        Radial Cocktail/Verapamil only (mL) 10 mL Given 10/09/18 1259   Total dose as of 10/16/18 1542        10 mL        metoprolol tartrate (LOPRESSOR) injection (mg) 5 mg Given 10/09/18 1253   Total dose as of 10/16/18 1542        5 mg        heparin  injection (Units) 4,000 Units Given 10/09/18 1310   Total dose as of 10/16/18 1542        4,000 Units        iohexol (OMNIPAQUE) 350 MG/ML injection (mL) 30 mL Given 10/09/18 1318   Total dose as of 10/16/18 1542        30 mL        Sedation Time   Sedation Time Physician-1: 23 minutes 40 seconds  Coronary Findings   Diagnostic  Dominance: Right  Left Main  Vessel is large.  Left Anterior Descending  The LAD is a large vessel that reaches the apex. The first diagonal is large and divides into twin vessels. There is no stenosis throughout the LAD or diagonal branches.  Left Circumflex  Vessel is moderate in size. Vessel is angiographically normal. The circumflex is patent without stenosis.  Right Coronary Artery  Vessel is large. Vessel is angiographically normal. Large, dominant RCA without stenosis. The PDA and PLA branches are widely patent.  Intervention   No interventions have been documented.  Coronary Diagrams   Diagnostic  Dominance: Right    Intervention   Implants    No implant documentation for this case.  Syngo Images      Show images for CARDIAC CATHETERIZATION  MERGE Images   Show images for CARDIAC CATHETERIZATION   Link to Procedure Log   Procedure Log    Hemo Data    Most Recent Value  Fick Cardiac Output 4.23 L/min  Fick Cardiac Output Index 2.04 (L/min)/BSA  RA A Wave 1 mmHg  RA V Wave 1 mmHg  RA Mean 1 mmHg  RV Systolic Pressure 17 mmHg  RV Diastolic Pressure -1 mmHg  RV EDP 1 mmHg  PA Systolic Pressure 14 mmHg  PA Diastolic Pressure 7 mmHg  PA Mean 10 mmHg  PW A Wave -99 mmHg  PW V Wave 14 mmHg  PW Mean 11 mmHg  LV Systolic Pressure 77 mmHg  LV Diastolic Pressure 0 mmHg  LV EDP 0 mmHg  AOp Systolic Pressure 81 mmHg  AOp Diastolic Pressure 56 mmHg  AOp Mean Pressure 68 mmHg  LVp Systolic Pressure 81 mmHg  LVp Diastolic Pressure 0 mmHg  LVp EDP Pressure 2 mmHg  QP/QS  1  TPVR Index 4.9 HRUI      CT ANGIOGRAPHY CHEST, ABDOMEN AND PELVIS  TECHNIQUE: Multidetector CT imaging through the chest, abdomen and pelvis was performed using the standard protocol during bolus administration of intravenous contrast. Multiplanar reconstructed images and MIPs were obtained and reviewed to evaluate the vascular anatomy.  CONTRAST: 75mL ISOVUE-370 IOPAMIDOL (ISOVUE-370) INJECTION 76%  COMPARISON: Prior CT scan of the abdomen and pelvis 05/24/2018 and 12/29/2012  FINDINGS: CTA CHEST FINDINGS  Cardiovascular: Conventional 3 vessel aortic arch. The aortic root, ascending, transverse and descending thoracic aorta are all normal in caliber. No significant atherosclerotic plaque or evidence of dissection. The main and central pulmonary arteries are normal in caliber. No central pulmonary embolus. The heart is normal in size. No pericardial effusion. Unremarkable pulmonary venous drainage pattern.  Mediastinum/Nodes: Unremarkable CT appearance of the thyroid gland. No suspicious mediastinal or hilar adenopathy. No soft tissue mediastinal mass. The thoracic esophagus is unremarkable.  Lungs/Pleura: Mild biapical pleuroparenchymal scarring. Stable 6 mm subpleural pulmonary nodule in the anterior right middle lobe across multiple prior studies dating back to 2014 consistent with a benign process. Minimal dependent atelectasis in the lower lobes. Diffuse mild bronchial wall thickening. Scattered small 2 and 3 mm bilateral lower lobe pulmonary nodules remain stable dating back to 2014.  Musculoskeletal: No acute fracture or aggressive appearing lytic or blastic osseous lesion.  Review of the MIP images confirms the above findings.  CTA ABDOMEN AND PELVIS FINDINGS  VASCULAR  Aorta: Normal caliber aorta without aneurysm, dissection, vasculitis or significant stenosis.  Celiac: Patent without evidence of aneurysm, dissection, vasculitis or significant stenosis. 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  SMA: Patent without evidence of aneurysm, dissection, vasculitis or significant stenosis.  Renals: There are 3 right-sided renal arteries and a single left-sided renal artery. No evidence of fibromuscular dysplasia, stenosis or aneurysm.  IMA: Patent without evidence of aneurysm, dissection, vasculitis or significant stenosis.  Inflow: Patent without evidence of aneurysm, dissection, vasculitis or significant stenosis.  Veins: No focal venous abnormality.  Review of the MIP images confirms the above findings.  NON-VASCULAR  Hepatobiliary: Normal hepatic contour and morphology. No discrete hepatic lesions. Normal appearance of the gallbladder. No intra or extrahepatic biliary ductal dilatation.  Pancreas: Unremarkable. No pancreatic ductal dilatation or surrounding inflammatory changes.  Spleen: Normal in size without focal abnormality.  Adrenals/Urinary Tract: Normal adrenal glands. No evidence of hydronephrosis or enhancing renal mass. A 1.2 cm nonobstructing stone is identified in the left lower pole collecting system. The ureters and bladder are unremarkable.  Stomach/Bowel: Colonic diverticular disease without CT evidence of active inflammation. No focal bowel obstruction.  Lymphatic: No suspicious lymphadenopathy.  Reproductive: Prostate is unremarkable.  Other: No abdominal wall hernia or abnormality. No abdominopelvic ascites.  Musculoskeletal: No acute fracture or aggressive appearing lytic or blastic osseous lesion. Focal L5-S1 degenerative disc disease.  Review of the MIP images confirms the above findings.  IMPRESSION: 1. No evidence of significant atherosclerotic plaque, aneurysm, dissection or other significant vascular abnormality in the chest, abdomen or pelvis. 2. Incidental anatomic variance including replaced left hepatic artery and multiplicity of the right renal  artery noted incidentally. 3. Stable bilateral pulmonary nodules dating back to 12/29/2012 consistent with benign granulomas. 4. Diffuse mild bronchial wall thickening consistent with the clinical history of asthma. 5. Nonobstructing left lower pole nephrolithiasis. 6. Extensive colonic diverticulosis without evidence of acute diverticulitis. 7. Focal L5-S1 degenerative disc disease.  Signed,  Sterling Big, MD,  RPVI  [facility]  [facility]   Electronically Signed By: Malachy MoanHeath McCullough M.D. On: 09/26/2018 14:47     Impression:  Patient has mitral valve prolapse with stage D severe symptomatic primary mitral regurgitationand recently diagnosed paroxysmal atrial fibrillation. Hereportssymptoms of mildexertional shortness of breath, decreased exercise tolerance, and a single dizzy spell following exertion. He has a long history of intermittent atypical chest pain and palpitations,and after he was found to have paroxysmal atrial fibrillation [place] reports that he believes he has had similar episodes of atrial fibrillation for several years. I personally reviewed the patient'smostrecent transthoracic and transesophageal echocardiograms,diagnostic cardiac catheterization, and CT angiogram. Patient has mitral valve prolapse with an obvious flail segment involving a portion of the middle scallop of the posterior leaflet. There is severe mitral regurgitation. The jet of regurgitation is eccentric, which explains why it was underestimated on transthoracic echocardiogram. Left ventricular systolic function remains normal. I agree the patient would likely benefit from elective mitral valve repair. Based upon review of the patient's TEE I feel there is a high likelihood that his valve should be repairable with very low associated operative risk.Diagnostic cardiac  catheterization is notable for the absence of significant coronary artery disease. Right heart pressures were normal. CT angiography reveals no contraindication to peripheral cannulation for surgery.    Plan:  The patient was again counseled at length regarding the indications, risks and potential benefits of mitral valve repair and maze procedure.  The rationale for elective surgery has been explained, including a comparison between surgery and continued medical therapy with close follow-up.  The likelihood of successful and durable mitral valve repair has been discussed with particular reference to the findings of their recent echocardiogram.  Based upon these findings and previous experience, I have quoted them a greater than 95 percent likelihood of successful valve repair with less than 1 percent risk of mortality or major morbidity.  Alternative surgical approaches have been discussed including a comparison between conventional sternotomy and minimally-invasive techniques.  The relative risks and benefits of each have been reviewed as they pertain to the patient's specific circumstances, and expectations for the patient's postoperative convalescence has been discussed.  The relative risks and benefits of performing a maze procedure at the time of his surgery was discussed at length, including the expected likelihood of long term freedom from recurrent symptomatic atrial fibrillation and/or atrial flutter.    The patient understands and accepts all potential risks of surgery including but not limited to risk of death, stroke or other neurologic complication, myocardial infarction, congestive heart failure, respiratory failure, renal failure, bleeding requiring transfusion and/or reexploration, arrhythmia, infection or other wound complications, pneumonia, pleural and/or pericardial effusion, pulmonary embolus, aortic dissection or other major vascular complication, or delayed complications  related to valve repair or replacement including but not limited to structural valve deterioration and failure, thrombosis, embolization, endocarditis, or paravalvular leak.  Specific risks potentially related to the minimally-invasive approach were discussed at length, including but not limited to risk of conversion to full or partial sternotomy, aortic dissection or other major vascular complication, unilateral acute lung injury or pulmonary edema, phrenic nerve dysfunction or paralysis, rib fracture, chronic pain, lung hernia, or lymphocele. All of his questions have been answered.     Salvatore Decentlarence H. Cornelius Moraswen, MD 03/05/2019 3:08 PM

## 2019-03-05 NOTE — Pre-Procedure Instructions (Signed)
   Alejandro Burnett  03/05/2019     [facility] - [place], [address] [place] [address] Phone: [phone] Fax: [phone]   Your procedure is scheduled on Wednesday, March 07, 2019  Report to [facility] Admitting at 6:30 A.M.  Call this number if you have problems the morning of surgery:  [phone]   Remember: Brush your teeth the morning of surgery with your regular toothpaste.  Do not eat or drink after midnight Tuesday, March 06, 2019  Take these medicines the morning of surgery with A SIP OF WATER : montelukast (SINGULAIR),  metoprolol tartrate (LOPRESSOR),  If needed: fluticasone (FLONASE) nasal spray for allergies If needed:fluticasone-salmeterol (ADVAIR HFA) inhaler, albuterol (PROVENTIL HFA;VENTOLIN HFA)  Inhaler ( Bring inhalers in on day of surgery ) If needed: Ketotifen Fumarate (ALLERGY EYE DROPS )  Stop taking LUTEIN,  vitamins, fish oil and herbal medications. Do not take any NSAIDs ie: Ibuprofen, Advil, Naproxen (Aleve), Motrin, BC and Goody Powder; stop now.  Do not wear jewelry, make-up or nail polish.  Do not wear lotions, powders, or perfumes, or deodorant.  Do not shave 48 hours prior to surgery.  Men may shave face and neck.  Do not bring valuables to the hospital.  [facility] is not responsible for any belongings or valuables.  Contacts, dentures or bridgework may not be worn into surgery.  Leave your suitcase in the car.  After surgery it may be brought to your room. For patients admitted to the hospital, discharge time will be determined by your treatment team. Patients discharged the day of surgery will not be allowed to drive home.  Special instructions: Shower the night before and the morning of surgery with CHG. Please read over the following fact sheets that you were given. Pain Booklet, Coughing and Deep Breathing, MRSA Information and Surgical Site Infection Prevention

## 2019-03-06 ENCOUNTER — Encounter (HOSPITAL_COMMUNITY): Payer: Self-pay | Admitting: Thoracic Surgery (Cardiothoracic Vascular Surgery)

## 2019-03-06 LAB — SARS CORONAVIRUS 2 (TAT 6-24 HRS): SARS Coronavirus 2: NEGATIVE

## 2019-03-06 MED ORDER — TRANEXAMIC ACID 1000 MG/10ML IV SOLN
1.5000 mg/kg/h | INTRAVENOUS | Status: AC
  Administered 2019-03-07: 1.5 mg/kg/h via INTRAVENOUS
  Filled 2019-03-06: qty 25

## 2019-03-06 MED ORDER — INSULIN REGULAR(HUMAN) IN NACL 100-0.9 UT/100ML-% IV SOLN
INTRAVENOUS | Status: AC
  Administered 2019-03-07: 1 [IU]/h via INTRAVENOUS
  Filled 2019-03-06: qty 100

## 2019-03-06 MED ORDER — MILRINONE LACTATE IN DEXTROSE 20-5 MG/100ML-% IV SOLN
0.3000 ug/kg/min | INTRAVENOUS | Status: DC
  Filled 2019-03-06: qty 100

## 2019-03-06 MED ORDER — SODIUM CHLORIDE 0.9 % IV SOLN
INTRAVENOUS | Status: DC
  Filled 2019-03-06: qty 30

## 2019-03-06 MED ORDER — NITROGLYCERIN IN D5W 200-5 MCG/ML-% IV SOLN
2.0000 ug/min | INTRAVENOUS | Status: DC
  Filled 2019-03-06: qty 250

## 2019-03-06 MED ORDER — MANNITOL 20 % IV SOLN
INTRAVENOUS | Status: DC
  Filled 2019-03-06: qty 13

## 2019-03-06 MED ORDER — SODIUM CHLORIDE 0.9 % IV SOLN
1.5000 g | INTRAVENOUS | Status: AC
  Administered 2019-03-07: 1.5 g via INTRAVENOUS
  Administered 2019-03-07: .75 g via INTRAVENOUS
  Filled 2019-03-06: qty 1.5

## 2019-03-06 MED ORDER — TRANEXAMIC ACID (OHS) PUMP PRIME SOLUTION
2.0000 mg/kg | INTRAVENOUS | Status: DC
  Filled 2019-03-06: qty 1.69

## 2019-03-06 MED ORDER — EPINEPHRINE PF 1 MG/ML IJ SOLN
0.0000 ug/min | INTRAVENOUS | Status: DC
  Filled 2019-03-06: qty 4

## 2019-03-06 MED ORDER — PLASMA-LYTE 148 IV SOLN
INTRAVENOUS | Status: DC
  Filled 2019-03-06: qty 2.5

## 2019-03-06 MED ORDER — DEXMEDETOMIDINE HCL IN NACL 400 MCG/100ML IV SOLN
0.1000 ug/kg/h | INTRAVENOUS | Status: AC
  Administered 2019-03-07: 0.7 ug/kg/h via INTRAVENOUS
  Filled 2019-03-06: qty 100

## 2019-03-06 MED ORDER — GLUTARALDEHYDE 0.625% SOAKING SOLUTION
TOPICAL | Status: DC
  Filled 2019-03-06: qty 50

## 2019-03-06 MED ORDER — POTASSIUM CHLORIDE 2 MEQ/ML IV SOLN
80.0000 meq | INTRAVENOUS | Status: DC
  Filled 2019-03-06: qty 40

## 2019-03-06 MED ORDER — SODIUM CHLORIDE 0.9 % IV SOLN
750.0000 mg | INTRAVENOUS | Status: DC
  Filled 2019-03-06: qty 750

## 2019-03-06 MED ORDER — VANCOMYCIN HCL 10 G IV SOLR
1500.0000 mg | INTRAVENOUS | Status: AC
  Administered 2019-03-07: 1500 mg via INTRAVENOUS
  Filled 2019-03-06: qty 1500

## 2019-03-06 MED ORDER — DOPAMINE-DEXTROSE 3.2-5 MG/ML-% IV SOLN
0.0000 ug/kg/min | INTRAVENOUS | Status: DC
  Filled 2019-03-06: qty 250

## 2019-03-06 MED ORDER — PHENYLEPHRINE HCL-NACL 20-0.9 MG/250ML-% IV SOLN
30.0000 ug/min | INTRAVENOUS | Status: AC
  Administered 2019-03-07: 15 ug/min via INTRAVENOUS
  Filled 2019-03-06: qty 250

## 2019-03-06 MED ORDER — VANCOMYCIN HCL 1000 MG IV SOLR
INTRAVENOUS | Status: DC
  Filled 2019-03-06: qty 1000

## 2019-03-06 MED ORDER — TRANEXAMIC ACID (OHS) BOLUS VIA INFUSION
15.0000 mg/kg | INTRAVENOUS | Status: AC
  Administered 2019-03-07: 1266 mg via INTRAVENOUS
  Filled 2019-03-06: qty 1266

## 2019-03-06 NOTE — Anesthesia Preprocedure Evaluation (Addendum)
Anesthesia Evaluation  Patient identified by MRN, date of birth, ID band Patient awake    Reviewed: Allergy & Precautions, NPO status , Patient's Chart, lab work & pertinent test results, reviewed documented beta blocker date and time   History of Anesthesia Complications Negative for: history of anesthetic complications  Airway Mallampati: II  TM Distance: >3 FB Neck ROM: Full    Dental  (+) Dental Advisory Given   Pulmonary COPD,  COPD inhaler, former smoker (quit 1986),    breath sounds clear to auscultation       Cardiovascular hypertension, Pt. on home beta blockers and Pt. on medications (-) angina(-) CAD + dysrhythmias Atrial Fibrillation  Rhythm:Regular Rate:Normal  08/2018 ECHO: EF 60% to 65%. Wall motion is  normal - Mitral valve: Mild diffuse thickening, consistent with myxomatous proliferation. Moderate, late systolic prolapse, involving the middle scallop of the posterior leaflet. Moderate flail motion involving the middle scallop (P2) of the posterior leaflet due to rupture of one or more chords. Moderate to severe MR directed eccentrically and toward the septum   Neuro/Psych  Headaches,    GI/Hepatic Neg liver ROS, GERD  Medicated and Controlled,  Endo/Other  diabetes (glu 102), Oral Hypoglycemic Agents  Renal/GU negative Renal ROS     Musculoskeletal   Abdominal   Peds  Hematology negative hematology ROS (+)   Anesthesia Other Findings   Reproductive/Obstetrics                            Anesthesia Physical Anesthesia Plan  ASA: III  Anesthesia Plan: General   Post-op Pain Management:    Induction: Intravenous  PONV Risk Score and Plan: 2 and Treatment may vary due to age or medical condition  Airway Management Planned: Oral ETT and Double Lumen EBT  Additional Equipment: Arterial line, PA Cath, 3D TEE and Ultrasound Guidance Line Placement  Intra-op Plan:    Post-operative Plan: Post-operative intubation/ventilation  Informed Consent: I have reviewed the patients History and Physical, chart, labs and discussed the procedure including the risks, benefits and alternatives for the proposed anesthesia with the patient or authorized representative who has indicated his/her understanding and acceptance.     Dental advisory given  Plan Discussed with: CRNA and Surgeon  Anesthesia Plan Comments:        Anesthesia Quick Evaluation

## 2019-03-07 ENCOUNTER — Encounter (HOSPITAL_COMMUNITY): Payer: Self-pay

## 2019-03-07 ENCOUNTER — Inpatient Hospital Stay (HOSPITAL_COMMUNITY): Payer: Medicare Other

## 2019-03-07 ENCOUNTER — Inpatient Hospital Stay (HOSPITAL_COMMUNITY): Payer: Medicare Other | Admitting: Physician Assistant

## 2019-03-07 ENCOUNTER — Other Ambulatory Visit: Payer: Self-pay

## 2019-03-07 ENCOUNTER — Inpatient Hospital Stay (HOSPITAL_COMMUNITY): Payer: Medicare Other | Admitting: Certified Registered Nurse Anesthetist

## 2019-03-07 ENCOUNTER — Encounter (HOSPITAL_COMMUNITY)
Admission: RE | Disposition: A | Discharge status: Home / Self Care | Payer: Self-pay | Source: Home / Self Care | Attending: Thoracic Surgery (Cardiothoracic Vascular Surgery)

## 2019-03-07 ENCOUNTER — Inpatient Hospital Stay (HOSPITAL_COMMUNITY)
Admission: RE | Admit: 2019-03-07 | Discharge: 2019-03-13 | DRG: 219 | Disposition: A | Discharge status: Home / Self Care | Payer: Medicare Other | Attending: Thoracic Surgery (Cardiothoracic Vascular Surgery) | Admitting: Thoracic Surgery (Cardiothoracic Vascular Surgery)

## 2019-03-07 DIAGNOSIS — Z7984 Long term (current) use of oral hypoglycemic drugs: Secondary | ICD-10-CM

## 2019-03-07 DIAGNOSIS — I9589 Other hypotension: Secondary | ICD-10-CM | POA: Diagnosis not present

## 2019-03-07 DIAGNOSIS — I4891 Unspecified atrial fibrillation: Secondary | ICD-10-CM | POA: Diagnosis not present

## 2019-03-07 DIAGNOSIS — D62 Acute posthemorrhagic anemia: Secondary | ICD-10-CM | POA: Diagnosis not present

## 2019-03-07 DIAGNOSIS — Z1159 Encounter for screening for other viral diseases: Secondary | ICD-10-CM | POA: Diagnosis not present

## 2019-03-07 DIAGNOSIS — E785 Hyperlipidemia, unspecified: Secondary | ICD-10-CM | POA: Diagnosis present

## 2019-03-07 DIAGNOSIS — Z8249 Family history of ischemic heart disease and other diseases of the circulatory system: Secondary | ICD-10-CM | POA: Diagnosis not present

## 2019-03-07 DIAGNOSIS — J9811 Atelectasis: Secondary | ICD-10-CM

## 2019-03-07 DIAGNOSIS — Z7901 Long term (current) use of anticoagulants: Secondary | ICD-10-CM | POA: Diagnosis not present

## 2019-03-07 DIAGNOSIS — I480 Paroxysmal atrial fibrillation: Secondary | ICD-10-CM | POA: Diagnosis present

## 2019-03-07 DIAGNOSIS — Z888 Allergy status to other drugs, medicaments and biological substances status: Secondary | ICD-10-CM | POA: Diagnosis not present

## 2019-03-07 DIAGNOSIS — E8770 Fluid overload, unspecified: Secondary | ICD-10-CM | POA: Diagnosis not present

## 2019-03-07 DIAGNOSIS — I493 Ventricular premature depolarization: Secondary | ICD-10-CM | POA: Diagnosis not present

## 2019-03-07 DIAGNOSIS — I341 Nonrheumatic mitral (valve) prolapse: Secondary | ICD-10-CM | POA: Diagnosis present

## 2019-03-07 DIAGNOSIS — Z7951 Long term (current) use of inhaled steroids: Secondary | ICD-10-CM

## 2019-03-07 DIAGNOSIS — Z87891 Personal history of nicotine dependence: Secondary | ICD-10-CM

## 2019-03-07 DIAGNOSIS — Z9689 Presence of other specified functional implants: Secondary | ICD-10-CM

## 2019-03-07 DIAGNOSIS — I4819 Other persistent atrial fibrillation: Secondary | ICD-10-CM | POA: Diagnosis present

## 2019-03-07 DIAGNOSIS — K219 Gastro-esophageal reflux disease without esophagitis: Secondary | ICD-10-CM | POA: Diagnosis present

## 2019-03-07 DIAGNOSIS — D696 Thrombocytopenia, unspecified: Secondary | ICD-10-CM | POA: Diagnosis not present

## 2019-03-07 DIAGNOSIS — Z91010 Allergy to peanuts: Secondary | ICD-10-CM | POA: Diagnosis not present

## 2019-03-07 DIAGNOSIS — Z79899 Other long term (current) drug therapy: Secondary | ICD-10-CM | POA: Diagnosis not present

## 2019-03-07 DIAGNOSIS — Z87442 Personal history of urinary calculi: Secondary | ICD-10-CM

## 2019-03-07 DIAGNOSIS — I340 Nonrheumatic mitral (valve) insufficiency: Secondary | ICD-10-CM | POA: Diagnosis not present

## 2019-03-07 DIAGNOSIS — N401 Enlarged prostate with lower urinary tract symptoms: Secondary | ICD-10-CM | POA: Diagnosis not present

## 2019-03-07 DIAGNOSIS — Z8679 Personal history of other diseases of the circulatory system: Secondary | ICD-10-CM

## 2019-03-07 DIAGNOSIS — I9788 Other intraoperative complications of the circulatory system, not elsewhere classified: Secondary | ICD-10-CM | POA: Diagnosis not present

## 2019-03-07 DIAGNOSIS — Z791 Long term (current) use of non-steroidal anti-inflammatories (NSAID): Secondary | ICD-10-CM | POA: Diagnosis not present

## 2019-03-07 DIAGNOSIS — J45909 Unspecified asthma, uncomplicated: Secondary | ICD-10-CM | POA: Diagnosis present

## 2019-03-07 DIAGNOSIS — Z91018 Allergy to other foods: Secondary | ICD-10-CM

## 2019-03-07 DIAGNOSIS — I511 Rupture of chordae tendineae, not elsewhere classified: Secondary | ICD-10-CM | POA: Diagnosis not present

## 2019-03-07 DIAGNOSIS — K5730 Diverticulosis of large intestine without perforation or abscess without bleeding: Secondary | ICD-10-CM | POA: Diagnosis present

## 2019-03-07 DIAGNOSIS — Z9889 Other specified postprocedural states: Secondary | ICD-10-CM

## 2019-03-07 DIAGNOSIS — Z419 Encounter for procedure for purposes other than remedying health state, unspecified: Secondary | ICD-10-CM

## 2019-03-07 HISTORY — PX: MITRAL VALVE REPAIR: SHX2039

## 2019-03-07 HISTORY — DX: Personal history of other diseases of the circulatory system: Z86.79

## 2019-03-07 HISTORY — DX: Other specified postprocedural states: Z98.890

## 2019-03-07 HISTORY — PX: MINIMALLY INVASIVE MAZE PROCEDURE: SHX6244

## 2019-03-07 HISTORY — PX: TEE WITHOUT CARDIOVERSION: SHX5443

## 2019-03-07 LAB — HEMOGLOBIN AND HEMATOCRIT, BLOOD
HCT: 30.9 % — ABNORMAL LOW (ref 39.0–52.0)
Hemoglobin: 10.3 g/dL — ABNORMAL LOW (ref 13.0–17.0)

## 2019-03-07 LAB — BASIC METABOLIC PANEL
Anion gap: 7 (ref 5–15)
BUN: 17 mg/dL (ref 8–23)
CO2: 21 mmol/L — ABNORMAL LOW (ref 22–32)
Calcium: 7.6 mg/dL — ABNORMAL LOW (ref 8.9–10.3)
Chloride: 112 mmol/L — ABNORMAL HIGH (ref 98–111)
Creatinine, Ser: 0.91 mg/dL (ref 0.61–1.24)
GFR calc Af Amer: >60 mL/min (ref >60)
GFR calc non Af Amer: >60 mL/min (ref >60)
Glucose, Bld: 129 mg/dL — ABNORMAL HIGH (ref 70–99)
Potassium: 4.6 mmol/L (ref 3.5–5.1)
Sodium: 140 mmol/L (ref 135–145)

## 2019-03-07 LAB — POCT I-STAT 7, (LYTES, BLD GAS, ICA,H+H)
Acid-base deficit: 4 mmol/L — ABNORMAL HIGH (ref 0.0–2.0)
Acid-base deficit: 4 mmol/L — ABNORMAL HIGH (ref 0.0–2.0)
Bicarbonate: 21.8 mmol/L (ref 20.0–28.0)
Bicarbonate: 22.7 mmol/L (ref 20.0–28.0)
Calcium, Ion: 1.15 mmol/L (ref 1.15–1.40)
Calcium, Ion: 1.13 mmol/L — ABNORMAL LOW (ref 1.15–1.40)
HCT: 36 % — ABNORMAL LOW (ref 39.0–52.0)
HCT: 33 % — ABNORMAL LOW (ref 39.0–52.0)
Hemoglobin: 12.2 g/dL — ABNORMAL LOW (ref 13.0–17.0)
Hemoglobin: 11.2 g/dL — ABNORMAL LOW (ref 13.0–17.0)
O2 Saturation: 97 %
O2 Saturation: 96 %
Patient temperature: 36.5
Patient temperature: 36.6
Potassium: 3.9 mmol/L (ref 3.5–5.1)
Potassium: 4.2 mmol/L (ref 3.5–5.1)
Sodium: 143 mmol/L (ref 135–145)
Sodium: 144 mmol/L (ref 135–145)
TCO2: 23 mmol/L (ref 22–32)
TCO2: 24 mmol/L (ref 22–32)
pCO2 arterial: 43.1 mmHg (ref 32.0–48.0)
pCO2 arterial: 45.4 mmHg (ref 32.0–48.0)
pH, Arterial: 7.31 — ABNORMAL LOW (ref 7.350–7.450)
pH, Arterial: 7.304 — ABNORMAL LOW (ref 7.350–7.450)
pO2, Arterial: 97 mmHg (ref 83.0–108.0)
pO2, Arterial: 89 mmHg (ref 83.0–108.0)

## 2019-03-07 LAB — BLOOD GAS, ARTERIAL
Acid-Base Excess: 3.4 mmol/L — ABNORMAL HIGH (ref 0.0–2.0)
Bicarbonate: 28.5 mmol/L — ABNORMAL HIGH (ref 20.0–28.0)
FIO2: 21
O2 Saturation: 99.2 %
Patient temperature: 98.6
pCO2 arterial: 52.2 mmHg — ABNORMAL HIGH (ref 32.0–48.0)
pH, Arterial: 7.357 (ref 7.350–7.450)
pO2, Arterial: 216 mmHg — ABNORMAL HIGH (ref 83.0–108.0)

## 2019-03-07 LAB — CBC
HCT: 38.7 % — ABNORMAL LOW (ref 39.0–52.0)
HCT: 34.3 % — ABNORMAL LOW (ref 39.0–52.0)
Hemoglobin: 12.5 g/dL — ABNORMAL LOW (ref 13.0–17.0)
Hemoglobin: 11.1 g/dL — ABNORMAL LOW (ref 13.0–17.0)
MCH: 30 pg (ref 26.0–34.0)
MCH: 29.6 pg (ref 26.0–34.0)
MCHC: 32.3 g/dL (ref 30.0–36.0)
MCHC: 32.4 g/dL (ref 30.0–36.0)
MCV: 92.8 fL (ref 80.0–100.0)
MCV: 91.5 fL (ref 80.0–100.0)
Platelets: 111 10*3/uL — ABNORMAL LOW (ref 150–400)
Platelets: 86 10*3/uL — ABNORMAL LOW (ref 150–400)
RBC: 4.17 MIL/uL — ABNORMAL LOW (ref 4.22–5.81)
RBC: 3.75 MIL/uL — ABNORMAL LOW (ref 4.22–5.81)
RDW: 12.2 % (ref 11.5–15.5)
RDW: 12.3 % (ref 11.5–15.5)
WBC: 27.3 10*3/uL — ABNORMAL HIGH (ref 4.0–10.5)
WBC: 16.6 10*3/uL — ABNORMAL HIGH (ref 4.0–10.5)
nRBC: 0 % (ref 0.0–0.2)
nRBC: 0 % (ref 0.0–0.2)

## 2019-03-07 LAB — GLUCOSE, CAPILLARY
Glucose-Capillary: 102 mg/dL — ABNORMAL HIGH (ref 70–99)
Glucose-Capillary: 122 mg/dL — ABNORMAL HIGH (ref 70–99)
Glucose-Capillary: 117 mg/dL — ABNORMAL HIGH (ref 70–99)
Glucose-Capillary: 124 mg/dL — ABNORMAL HIGH (ref 70–99)
Glucose-Capillary: 119 mg/dL — ABNORMAL HIGH (ref 70–99)
Glucose-Capillary: 113 mg/dL — ABNORMAL HIGH (ref 70–99)
Glucose-Capillary: 122 mg/dL — ABNORMAL HIGH (ref 70–99)

## 2019-03-07 LAB — PLATELET COUNT: Platelets: 117 10*3/uL — ABNORMAL LOW (ref 150–400)

## 2019-03-07 LAB — POCT I-STAT 4, (NA,K, GLUC, HGB,HCT)
Glucose, Bld: 117 mg/dL — ABNORMAL HIGH (ref 70–99)
HCT: 35 % — ABNORMAL LOW (ref 39.0–52.0)
Hemoglobin: 11.9 g/dL — ABNORMAL LOW (ref 13.0–17.0)
Potassium: 3.8 mmol/L (ref 3.5–5.1)
Sodium: 143 mmol/L (ref 135–145)

## 2019-03-07 LAB — MAGNESIUM: Magnesium: 3.2 mg/dL — ABNORMAL HIGH (ref 1.7–2.4)

## 2019-03-07 LAB — PROTIME-INR
INR: 1.4 — ABNORMAL HIGH (ref 0.8–1.2)
Prothrombin Time: 17.3 seconds — ABNORMAL HIGH (ref 11.4–15.2)

## 2019-03-07 LAB — ECHO INTRAOPERATIVE TEE
Height: 73 in
Weight: 2960 oz

## 2019-03-07 LAB — APTT: aPTT: 32 seconds (ref 24–36)

## 2019-03-07 SURGERY — REPAIR, MITRAL VALVE, MINIMALLY INVASIVE
Anesthesia: General | Site: Chest | Laterality: Right

## 2019-03-07 MED ORDER — VANCOMYCIN HCL 1000 MG IV SOLR
INTRAVENOUS | Status: DC | PRN
  Administered 2019-03-07: 11:00:00 1000 mL

## 2019-03-07 MED ORDER — ACETAMINOPHEN 160 MG/5ML PO SOLN: 650.0000 mg | Freq: Once | ORAL | Status: AC

## 2019-03-07 MED ORDER — CHLORHEXIDINE GLUCONATE 0.12 % MT SOLN
15.0000 mL | OROMUCOSAL | Status: AC
  Administered 2019-03-07: 15 mL via OROMUCOSAL

## 2019-03-07 MED ORDER — ROCURONIUM BROMIDE 10 MG/ML (PF) SYRINGE
PREFILLED_SYRINGE | INTRAVENOUS | Status: DC | PRN
  Administered 2019-03-07: 20 mg via INTRAVENOUS
  Administered 2019-03-07: 10 mg via INTRAVENOUS
  Administered 2019-03-07: 30 mg via INTRAVENOUS
  Administered 2019-03-07: 70 mg via INTRAVENOUS
  Administered 2019-03-07: 30 mg via INTRAVENOUS

## 2019-03-07 MED ORDER — FENTANYL CITRATE (PF) 250 MCG/5ML IJ SOLN
INTRAMUSCULAR | Status: DC | PRN
  Administered 2019-03-07: 700 ug via INTRAVENOUS
  Administered 2019-03-07 (×2): 50 ug via INTRAVENOUS
  Administered 2019-03-07: 100 ug via INTRAVENOUS
  Administered 2019-03-07 (×2): 50 ug via INTRAVENOUS

## 2019-03-07 MED ORDER — BUPIVACAINE 0.5 % ON-Q PUMP SINGLE CATH 400 ML
400.0000 mL | INJECTION | Status: DC
  Filled 2019-03-07: qty 400

## 2019-03-07 MED ORDER — SODIUM CHLORIDE 0.9 % IV SOLN
1.5000 g | Freq: Two times a day (BID) | INTRAVENOUS | Status: AC
  Administered 2019-03-07 – 2019-03-09 (×4): 1.5 g via INTRAVENOUS
  Filled 2019-03-07 (×4): qty 1.5

## 2019-03-07 MED ORDER — METOPROLOL TARTRATE 5 MG/5ML IV SOLN: 2.5000 mg | INTRAVENOUS | Status: DC | PRN

## 2019-03-07 MED ORDER — ACETAMINOPHEN 650 MG RE SUPP
650.0000 mg | Freq: Once | RECTAL | Status: AC
  Administered 2019-03-07: 650 mg via RECTAL

## 2019-03-07 MED ORDER — NITROGLYCERIN IN D5W 200-5 MCG/ML-% IV SOLN: 0.0000 ug/min | INTRAVENOUS | Status: DC

## 2019-03-07 MED ORDER — POTASSIUM CHLORIDE 10 MEQ/50ML IV SOLN
10.0000 meq | INTRAVENOUS | Status: AC
  Administered 2019-03-07 (×3): 10 meq via INTRAVENOUS

## 2019-03-07 MED ORDER — HEMOSTATIC AGENTS (NO CHARGE) OPTIME
TOPICAL | Status: DC | PRN
  Administered 2019-03-07: 1 via TOPICAL

## 2019-03-07 MED ORDER — CHLORHEXIDINE GLUCONATE 0.12 % MT SOLN: 15.0000 mL | Freq: Two times a day (BID) | OROMUCOSAL | Status: DC

## 2019-03-07 MED ORDER — ALBUMIN HUMAN 5 % IV SOLN
250.0000 mL | INTRAVENOUS | Status: AC | PRN
  Administered 2019-03-07 (×3): 12.5 g via INTRAVENOUS
  Filled 2019-03-07: qty 250

## 2019-03-07 MED ORDER — DEXMEDETOMIDINE HCL IN NACL 200 MCG/50ML IV SOLN: 0.0000 ug/kg/h | INTRAVENOUS | Status: DC

## 2019-03-07 MED ORDER — BUPIVACAINE HCL (PF) 0.5 % IJ SOLN
INTRAMUSCULAR | Status: AC
  Filled 2019-03-07: qty 10

## 2019-03-07 MED ORDER — BISACODYL 5 MG PO TBEC
10.0000 mg | DELAYED_RELEASE_TABLET | Freq: Every day | ORAL | Status: DC
  Administered 2019-03-08 – 2019-03-12 (×5): 10 mg via ORAL
  Filled 2019-03-07 (×6): qty 2

## 2019-03-07 MED ORDER — DEXAMETHASONE SODIUM PHOSPHATE 10 MG/ML IJ SOLN
INTRAMUSCULAR | Status: DC | PRN
  Administered 2019-03-07: 10 mg via INTRAVENOUS

## 2019-03-07 MED ORDER — BISACODYL 10 MG RE SUPP: 10.0000 mg | Freq: Every day | RECTAL | Status: DC

## 2019-03-07 MED ORDER — PROPOFOL 10 MG/ML IV BOLUS
INTRAVENOUS | Status: DC | PRN
  Administered 2019-03-07: 80 mg via INTRAVENOUS

## 2019-03-07 MED ORDER — MIDAZOLAM HCL 2 MG/2ML IJ SOLN: 2.0000 mg | INTRAMUSCULAR | Status: DC | PRN

## 2019-03-07 MED ORDER — SODIUM CHLORIDE 0.9% FLUSH: 3.0000 mL | INTRAVENOUS | Status: DC | PRN

## 2019-03-07 MED ORDER — SODIUM CHLORIDE 0.9 % IV SOLN
INTRAVENOUS | Status: DC
  Administered 2019-03-07: 17:00:00 via INTRAVENOUS

## 2019-03-07 MED ORDER — ONDANSETRON HCL 4 MG/2ML IJ SOLN
INTRAMUSCULAR | Status: AC
  Filled 2019-03-07: qty 2

## 2019-03-07 MED ORDER — LACTATED RINGERS IV SOLN
INTRAVENOUS | Status: DC | PRN
  Administered 2019-03-07 (×2): via INTRAVENOUS

## 2019-03-07 MED ORDER — MAGNESIUM SULFATE 4 GM/100ML IV SOLN
4.0000 g | Freq: Once | INTRAVENOUS | Status: AC
  Administered 2019-03-07: 16:00:00 4 g via INTRAVENOUS
  Filled 2019-03-07: qty 100

## 2019-03-07 MED ORDER — FENTANYL CITRATE (PF) 250 MCG/5ML IJ SOLN
INTRAMUSCULAR | Status: AC
  Filled 2019-03-07: qty 25

## 2019-03-07 MED ORDER — PLASMA-LYTE 148 IV SOLN
INTRAVENOUS | Status: DC | PRN
  Administered 2019-03-07: 10:00:00 500 mL via INTRAVASCULAR

## 2019-03-07 MED ORDER — CHLORHEXIDINE GLUCONATE 0.12 % MT SOLN
15.0000 mL | Freq: Once | OROMUCOSAL | Status: AC
  Administered 2019-03-07: 15 mL via OROMUCOSAL
  Filled 2019-03-07: qty 15

## 2019-03-07 MED ORDER — LACTATED RINGERS IV SOLN: INTRAVENOUS | Status: DC

## 2019-03-07 MED ORDER — CHLORHEXIDINE GLUCONATE CLOTH 2 % EX PADS
6.0000 | MEDICATED_PAD | Freq: Every day | CUTANEOUS | Status: DC
  Administered 2019-03-07 – 2019-03-11 (×5): 6 via TOPICAL

## 2019-03-07 MED ORDER — ORAL CARE MOUTH RINSE
15.0000 mL | Freq: Two times a day (BID) | OROMUCOSAL | Status: DC
  Administered 2019-03-07 – 2019-03-13 (×9): 15 mL via OROMUCOSAL

## 2019-03-07 MED ORDER — LACTATED RINGERS IV SOLN
INTRAVENOUS | Status: DC | PRN
  Administered 2019-03-07 (×2): via INTRAVENOUS

## 2019-03-07 MED ORDER — HEPARIN SODIUM (PORCINE) 1000 UNIT/ML IJ SOLN
INTRAMUSCULAR | Status: DC | PRN
  Administered 2019-03-07: 30000 [IU] via INTRAVENOUS

## 2019-03-07 MED ORDER — ORAL CARE MOUTH RINSE: 15.0000 mL | Freq: Two times a day (BID) | OROMUCOSAL | Status: DC

## 2019-03-07 MED ORDER — MORPHINE SULFATE (PF) 2 MG/ML IV SOLN
1.0000 mg | INTRAVENOUS | Status: DC | PRN
  Administered 2019-03-07 – 2019-03-08 (×2): 2 mg via INTRAVENOUS
  Filled 2019-03-07 (×2): qty 1

## 2019-03-07 MED ORDER — INSULIN REGULAR BOLUS VIA INFUSION
0.0000 [IU] | Freq: Three times a day (TID) | INTRAVENOUS | Status: DC
  Filled 2019-03-07: qty 10

## 2019-03-07 MED ORDER — SODIUM CHLORIDE 0.9% FLUSH
3.0000 mL | Freq: Two times a day (BID) | INTRAVENOUS | Status: DC
  Administered 2019-03-08 – 2019-03-13 (×9): 3 mL via INTRAVENOUS

## 2019-03-07 MED ORDER — VANCOMYCIN HCL IN DEXTROSE 1-5 GM/200ML-% IV SOLN
1000.0000 mg | Freq: Once | INTRAVENOUS | Status: AC
  Administered 2019-03-07: 1000 mg via INTRAVENOUS
  Filled 2019-03-07: qty 200

## 2019-03-07 MED ORDER — DEXAMETHASONE SODIUM PHOSPHATE 10 MG/ML IJ SOLN
INTRAMUSCULAR | Status: AC
  Filled 2019-03-07: qty 1

## 2019-03-07 MED ORDER — PROTAMINE SULFATE 10 MG/ML IV SOLN
INTRAVENOUS | Status: AC
  Filled 2019-03-07: qty 5

## 2019-03-07 MED ORDER — ROCURONIUM BROMIDE 10 MG/ML (PF) SYRINGE
PREFILLED_SYRINGE | INTRAVENOUS | Status: AC
  Filled 2019-03-07: qty 10

## 2019-03-07 MED ORDER — PROTAMINE SULFATE 10 MG/ML IV SOLN
INTRAVENOUS | Status: AC
  Filled 2019-03-07: qty 25

## 2019-03-07 MED ORDER — LACTATED RINGERS IV SOLN: 500.0000 mL | Freq: Once | INTRAVENOUS | Status: DC | PRN

## 2019-03-07 MED ORDER — SODIUM CHLORIDE 0.9 % IR SOLN
Status: DC | PRN
  Administered 2019-03-07: 3000 mL
  Administered 2019-03-07: 1000 mL

## 2019-03-07 MED ORDER — ROCURONIUM BROMIDE 10 MG/ML (PF) SYRINGE
PREFILLED_SYRINGE | INTRAVENOUS | Status: AC
  Filled 2019-03-07: qty 20

## 2019-03-07 MED ORDER — PHENYLEPHRINE 40 MCG/ML (10ML) SYRINGE FOR IV PUSH (FOR BLOOD PRESSURE SUPPORT)
PREFILLED_SYRINGE | INTRAVENOUS | Status: AC
  Filled 2019-03-07: qty 10

## 2019-03-07 MED ORDER — LACTATED RINGERS IV SOLN
INTRAVENOUS | Status: DC | PRN
  Administered 2019-03-07: 08:00:00 via INTRAVENOUS

## 2019-03-07 MED ORDER — TRAMADOL HCL 50 MG PO TABS
50.0000 mg | ORAL_TABLET | ORAL | Status: DC | PRN
  Administered 2019-03-09 – 2019-03-13 (×2): 50 mg via ORAL
  Filled 2019-03-07 (×3): qty 1

## 2019-03-07 MED ORDER — 0.9 % SODIUM CHLORIDE (POUR BTL) OPTIME
TOPICAL | Status: DC | PRN
  Administered 2019-03-07: 14:00:00 1000 mL
  Administered 2019-03-07: 10:00:00 5000 mL

## 2019-03-07 MED ORDER — PROPOFOL 10 MG/ML IV BOLUS
INTRAVENOUS | Status: AC
  Filled 2019-03-07: qty 20

## 2019-03-07 MED ORDER — SUGAMMADEX SODIUM 200 MG/2ML IV SOLN
INTRAVENOUS | Status: DC | PRN
  Administered 2019-03-07: 200 mg via INTRAVENOUS

## 2019-03-07 MED ORDER — FAMOTIDINE IN NACL 20-0.9 MG/50ML-% IV SOLN
20.0000 mg | Freq: Two times a day (BID) | INTRAVENOUS | Status: DC
  Administered 2019-03-07: 20 mg via INTRAVENOUS

## 2019-03-07 MED ORDER — ACETAMINOPHEN 160 MG/5ML PO SOLN: 1000.0000 mg | Freq: Four times a day (QID) | ORAL | Status: DC

## 2019-03-07 MED ORDER — SODIUM BICARBONATE 8.4 % IV SOLN
50.0000 meq | Freq: Once | INTRAVENOUS | Status: AC
  Administered 2019-03-07: 50 meq via INTRAVENOUS

## 2019-03-07 MED ORDER — OXYCODONE HCL 5 MG PO TABS
5.0000 mg | ORAL_TABLET | ORAL | Status: DC | PRN
  Administered 2019-03-07 – 2019-03-08 (×3): 10 mg via ORAL
  Filled 2019-03-07 (×3): qty 2

## 2019-03-07 MED ORDER — ALBUMIN HUMAN 5 % IV SOLN
INTRAVENOUS | Status: DC | PRN
  Administered 2019-03-07 (×2): via INTRAVENOUS

## 2019-03-07 MED ORDER — ACETAMINOPHEN 500 MG PO TABS
1000.0000 mg | ORAL_TABLET | Freq: Four times a day (QID) | ORAL | Status: AC
  Administered 2019-03-07 – 2019-03-12 (×17): 1000 mg via ORAL
  Filled 2019-03-07 (×17): qty 2

## 2019-03-07 MED ORDER — ASPIRIN 81 MG PO CHEW: 324.0000 mg | CHEWABLE_TABLET | Freq: Every day | ORAL | Status: DC

## 2019-03-07 MED ORDER — PROTAMINE SULFATE 10 MG/ML IV SOLN
INTRAVENOUS | Status: DC | PRN
  Administered 2019-03-07: 280 mg via INTRAVENOUS
  Administered 2019-03-07: 20 mg via INTRAVENOUS

## 2019-03-07 MED ORDER — BUPIVACAINE 0.5 % ON-Q PUMP SINGLE CATH 400 ML
INJECTION | Status: AC | PRN
  Administered 2019-03-07: 400 mL

## 2019-03-07 MED ORDER — HEPARIN SODIUM (PORCINE) 1000 UNIT/ML IJ SOLN
INTRAMUSCULAR | Status: AC
  Filled 2019-03-07: qty 3

## 2019-03-07 MED ORDER — MIDAZOLAM HCL (PF) 10 MG/2ML IJ SOLN
INTRAMUSCULAR | Status: AC
  Filled 2019-03-07: qty 2

## 2019-03-07 MED ORDER — METOPROLOL TARTRATE 12.5 MG HALF TABLET
12.5000 mg | ORAL_TABLET | Freq: Once | ORAL | Status: AC
  Administered 2019-03-07: 08:00:00 12.5 mg via ORAL
  Filled 2019-03-07: qty 1

## 2019-03-07 MED ORDER — CHLORHEXIDINE GLUCONATE 4 % EX LIQD: 30.0000 mL | CUTANEOUS | Status: DC

## 2019-03-07 MED ORDER — INSULIN REGULAR(HUMAN) IN NACL 100-0.9 UT/100ML-% IV SOLN: INTRAVENOUS | Status: DC

## 2019-03-07 MED ORDER — SODIUM CHLORIDE 0.9 % IV SOLN
INTRAVENOUS | Status: DC
  Administered 2019-03-07: 16:00:00 via INTRAVENOUS

## 2019-03-07 MED ORDER — PANTOPRAZOLE SODIUM 40 MG PO TBEC
40.0000 mg | DELAYED_RELEASE_TABLET | Freq: Every day | ORAL | Status: DC
  Administered 2019-03-09 – 2019-03-13 (×5): 40 mg via ORAL
  Filled 2019-03-07 (×5): qty 1

## 2019-03-07 MED ORDER — SODIUM CHLORIDE (PF) 0.9 % IJ SOLN
INTRAMUSCULAR | Status: AC
  Filled 2019-03-07: qty 10

## 2019-03-07 MED ORDER — ONDANSETRON HCL 4 MG/2ML IJ SOLN
4.0000 mg | Freq: Four times a day (QID) | INTRAMUSCULAR | Status: DC | PRN
  Administered 2019-03-08 – 2019-03-09 (×3): 4 mg via INTRAVENOUS
  Filled 2019-03-07 (×5): qty 2

## 2019-03-07 MED ORDER — ONDANSETRON HCL 4 MG/2ML IJ SOLN
INTRAMUSCULAR | Status: DC | PRN
  Administered 2019-03-07: 4 mg via INTRAVENOUS

## 2019-03-07 MED ORDER — BUPIVACAINE HCL (PF) 0.5 % IJ SOLN
INTRAMUSCULAR | Status: DC | PRN
  Administered 2019-03-07: 5 mL

## 2019-03-07 MED ORDER — DOCUSATE SODIUM 100 MG PO CAPS
200.0000 mg | ORAL_CAPSULE | Freq: Every day | ORAL | Status: DC
  Administered 2019-03-08 – 2019-03-12 (×5): 200 mg via ORAL
  Filled 2019-03-07 (×8): qty 2

## 2019-03-07 MED ORDER — MIDAZOLAM HCL 5 MG/5ML IJ SOLN
INTRAMUSCULAR | Status: DC | PRN
  Administered 2019-03-07: 2 mg via INTRAVENOUS
  Administered 2019-03-07: 1 mg via INTRAVENOUS
  Administered 2019-03-07: 2 mg via INTRAVENOUS
  Administered 2019-03-07: 1 mg via INTRAVENOUS

## 2019-03-07 MED ORDER — SODIUM CHLORIDE 0.9 % IV SOLN: 250.0000 mL | INTRAVENOUS | Status: DC

## 2019-03-07 MED ORDER — PHENYLEPHRINE HCL-NACL 20-0.9 MG/250ML-% IV SOLN: 0.0000 ug/min | INTRAVENOUS | Status: DC

## 2019-03-07 MED ORDER — SODIUM CHLORIDE 0.45 % IV SOLN
INTRAVENOUS | Status: DC | PRN
  Administered 2019-03-07: 16:00:00 via INTRAVENOUS

## 2019-03-07 MED ORDER — ASPIRIN EC 325 MG PO TBEC
325.0000 mg | DELAYED_RELEASE_TABLET | Freq: Every day | ORAL | Status: DC
  Administered 2019-03-08: 325 mg via ORAL
  Filled 2019-03-07: qty 1

## 2019-03-07 SURGICAL SUPPLY — 127 items
ADAPTER CARDIO PERF ANTE/RETRO (ADAPTER) ×3 IMPLANT
ADAPTER LEURLOCK VALVE (ADAPTER) ×1 IMPLANT
ADH SKN CLS APL DERMABOND .7 (GAUZE/BANDAGES/DRESSINGS) ×2
ADPR PRFSN 84XANTGRD RTRGD (ADAPTER) ×2
ARTICLIP LAA PROCLIP II 45 (Clip) ×3 IMPLANT
BAG DECANTER FOR FLEXI CONT (MISCELLANEOUS) ×7 IMPLANT
BLADE SURG 11 STRL SS (BLADE) ×3 IMPLANT
CANISTER SUCT 3000ML PPV (MISCELLANEOUS) ×5 IMPLANT
CANNULA FEM VENOUS REMOTE 22FR (CANNULA) IMPLANT
CANNULA FEMORAL ART 14 SM (MISCELLANEOUS) ×3 IMPLANT
CANNULA GUNDRY RCSP 15FR (MISCELLANEOUS) ×3 IMPLANT
CANNULA OPTISITE PERFUSION 16F (CANNULA) IMPLANT
CANNULA OPTISITE PERFUSION 18F (CANNULA) ×1 IMPLANT
CANNULA SUMP PERICARDIAL (CANNULA) ×6 IMPLANT
CARDIOBLATE CARDIAC ABLATION (MISCELLANEOUS)
CATH CPB KIT OWEN (MISCELLANEOUS) IMPLANT
CATH KIT ON Q 5IN SLV (PAIN MANAGEMENT) IMPLANT
CATH KIT ON-Q SILVERSOAK 5 (CATHETERS) IMPLANT
CATH KIT ON-Q SILVERSOAK 5IN (CATHETERS) ×3 IMPLANT
CELLS DAT CNTRL 66122 CELL SVR (MISCELLANEOUS) ×2 IMPLANT
CLAMP OLL ABLATION (MISCELLANEOUS) ×1 IMPLANT
CONN ST 1/4X3/8  BEN (MISCELLANEOUS) ×2
CONN ST 1/4X3/8 BEN (MISCELLANEOUS) ×4 IMPLANT
CONNECTOR 1/2X3/8X1/2 3 WAY (MISCELLANEOUS) ×1
CONNECTOR 1/2X3/8X1/2 3WAY (MISCELLANEOUS) ×2 IMPLANT
CONT SPEC 4OZ CLIKSEAL STRL BL (MISCELLANEOUS) ×1 IMPLANT
COVER BACK TABLE 24X17X13 BIG (DRAPES) ×3 IMPLANT
COVER PROBE W GEL 5X96 (DRAPES) ×1 IMPLANT
DERMABOND ADVANCED (GAUZE/BANDAGES/DRESSINGS) ×1
DERMABOND ADVANCED .7 DNX12 (GAUZE/BANDAGES/DRESSINGS) ×4 IMPLANT
DEVICE ATRICLIP LAA PRCLPII 45 (Clip) IMPLANT
DEVICE CARDIOBLATE CARDIAC ABL (MISCELLANEOUS) IMPLANT
DEVICE CLOSURE PERCLS PRGLD 6F (VASCULAR PRODUCTS) IMPLANT
DEVICE SUT CK QUICK LOAD MINI (Prosthesis & Implant Heart) ×2 IMPLANT
DEVICE TROCAR PUNCTURE CLOSURE (ENDOMECHANICALS) ×3 IMPLANT
DRAIN CHANNEL 28F RND 3/8 FF (WOUND CARE) ×6 IMPLANT
DRAPE BILATERAL SPLIT (DRAPES) ×3 IMPLANT
DRAPE C-ARM 42X72 X-RAY (DRAPES) ×3 IMPLANT
DRAPE CV SPLIT W-CLR ANES SCRN (DRAPES) ×3 IMPLANT
DRAPE INCISE IOBAN 66X45 STRL (DRAPES) ×8 IMPLANT
DRAPE SLUSH/WARMER DISC (DRAPES) ×3 IMPLANT
DRSG AQUACEL AG ADV 3.5X 6 (GAUZE/BANDAGES/DRESSINGS) ×1 IMPLANT
ELECT BLADE 6.5 EXT (BLADE) ×3 IMPLANT
ELECT REM PT RETURN 9FT ADLT (ELECTROSURGICAL) ×6
ELECTRODE REM PT RTRN 9FT ADLT (ELECTROSURGICAL) ×4 IMPLANT
FELT TEFLON 1X6 (MISCELLANEOUS) ×5 IMPLANT
FEMORAL VENOUS CANN RAP (CANNULA) IMPLANT
GAUZE SPONGE 4X4 12PLY STRL (GAUZE/BANDAGES/DRESSINGS) ×3 IMPLANT
GLOVE BIO SURGEON STRL SZ 6.5 (GLOVE) ×4 IMPLANT
GLOVE BIOGEL PI IND STRL 6 (GLOVE) IMPLANT
GLOVE BIOGEL PI IND STRL 6.5 (GLOVE) IMPLANT
GLOVE BIOGEL PI INDICATOR 6 (GLOVE) ×2
GLOVE BIOGEL PI INDICATOR 6.5 (GLOVE) ×2
GLOVE INDICATOR 7.5 STRL GRN (GLOVE) ×2 IMPLANT
GLOVE ORTHO TXT STRL SZ7.5 (GLOVE) ×9 IMPLANT
GOWN STRL REUS W/ TWL LRG LVL3 (GOWN DISPOSABLE) ×8 IMPLANT
GOWN STRL REUS W/TWL LRG LVL3 (GOWN DISPOSABLE) ×24
GRASPER SUT TROCAR 14GX15 (MISCELLANEOUS) ×3 IMPLANT
HEMOSTAT SURGICEL 2X14 (HEMOSTASIS) ×1 IMPLANT
IV NS IRRIG 3000ML ARTHROMATIC (IV SOLUTION) ×1 IMPLANT
IV SOD CHL 0.9% 1000ML (IV SOLUTION) ×1 IMPLANT
KIT BASIN OR (CUSTOM PROCEDURE TRAY) ×3 IMPLANT
KIT DILATOR VASC 18G NDL (KITS) ×3 IMPLANT
KIT DRAINAGE VACCUM ASSIST (KITS) ×1 IMPLANT
KIT SUCTION CATH 14FR (SUCTIONS) ×3 IMPLANT
KIT SUT CK MINI COMBO 4X17 (Prosthesis & Implant Heart) ×1 IMPLANT
KIT TURNOVER KIT B (KITS) ×3 IMPLANT
LEAD PACING MYOCARDI (MISCELLANEOUS) ×3 IMPLANT
LINE VENT (MISCELLANEOUS) ×1 IMPLANT
NDL AORTIC ROOT 14G 7F (CATHETERS) ×2 IMPLANT
NEEDLE AORTIC ROOT 14G 7F (CATHETERS) ×3 IMPLANT
NS IRRIG 1000ML POUR BTL (IV SOLUTION) ×15 IMPLANT
PACK E MIN INVASIVE VALVE (SUTURE) ×3 IMPLANT
PACK OPEN HEART (CUSTOM PROCEDURE TRAY) ×3 IMPLANT
PAD ARMBOARD 7.5X6 YLW CONV (MISCELLANEOUS) ×6 IMPLANT
PAD ELECT DEFIB RADIOL ZOLL (MISCELLANEOUS) ×3 IMPLANT
PERCLOSE PROGLIDE 6F (VASCULAR PRODUCTS) ×12
POSITIONER HEAD DONUT 9IN (MISCELLANEOUS) ×3 IMPLANT
PROBE CRYO2-ABLATION MALLABLE (MISCELLANEOUS) ×1 IMPLANT
RETRACTOR WND ALEXIS 18 MED (MISCELLANEOUS) ×2 IMPLANT
RING MITRAL MEMO 4D 34 (Prosthesis & Implant Heart) ×1 IMPLANT
RTRCTR WOUND ALEXIS 18CM MED (MISCELLANEOUS) ×3
SET CANNULATION TOURNIQUET (MISCELLANEOUS) ×3 IMPLANT
SET CARDIOPLEGIA MPS 5001102 (MISCELLANEOUS) ×1 IMPLANT
SET IRRIG TUBING LAPAROSCOPIC (IRRIGATION / IRRIGATOR) ×3 IMPLANT
SET MICROPUNCTURE 5F STIFF (MISCELLANEOUS) ×1 IMPLANT
SHEATH PINNACLE 8F 10CM (SHEATH) ×3 IMPLANT
SOLUTION ANTI FOG 6CC (MISCELLANEOUS) ×3 IMPLANT
SUT BONE WAX W31G (SUTURE) ×3 IMPLANT
SUT ETHIBOND (SUTURE) ×1 IMPLANT
SUT ETHIBOND 2 0 SH (SUTURE) ×1 IMPLANT
SUT ETHIBOND 2 0 V4 (SUTURE) IMPLANT
SUT ETHIBOND 2 0V4 GREEN (SUTURE) IMPLANT
SUT ETHIBOND 2-0 RB-1 WHT (SUTURE) ×2 IMPLANT
SUT ETHIBOND 4 0 TF (SUTURE) IMPLANT
SUT ETHIBOND 5 0 C 1 30 (SUTURE) IMPLANT
SUT ETHIBOND NAB MH 2-0 36IN (SUTURE) IMPLANT
SUT ETHIBOND X763 2 0 SH 1 (SUTURE) ×3 IMPLANT
SUT GORETEX 6.0 TH-9 30 IN (SUTURE) IMPLANT
SUT GORETEX CV-5THC-13 36IN (SUTURE) ×8 IMPLANT
SUT GORETEX TH-18 36 INCH (SUTURE) IMPLANT
SUT PROLENE 3 0 SH DA (SUTURE) ×2 IMPLANT
SUT PROLENE 3 0 SH1 36 (SUTURE) ×19 IMPLANT
SUT PROLENE 4 0 RB 1 (SUTURE) ×24
SUT PROLENE 4-0 RB1 .5 CRCL 36 (SUTURE) IMPLANT
SUT PROLENE 5 0 C 1 36 (SUTURE) ×2 IMPLANT
SUT PROLENE 6 0 C 1 30 (SUTURE) ×5 IMPLANT
SUT SILK  1 MH (SUTURE) ×1
SUT SILK 1 MH (SUTURE) IMPLANT
SUT SILK 2 0 SH CR/8 (SUTURE) IMPLANT
SUT SILK 3 0 SH CR/8 (SUTURE) IMPLANT
SUT VIC AB 2-0 CTX 36 (SUTURE) IMPLANT
SUT VIC AB 3-0 SH 8-18 (SUTURE) IMPLANT
SUT VICRYL 2 TP 1 (SUTURE) IMPLANT
SYSTEM SAHARA CHEST DRAIN ATS (WOUND CARE) ×5 IMPLANT
TAPE CLOTH SOFT 2X10 (GAUZE/BANDAGES/DRESSINGS) ×1 IMPLANT
TAPE CLOTH SURG 4X10 WHT LF (GAUZE/BANDAGES/DRESSINGS) ×1 IMPLANT
TOWEL GREEN STERILE (TOWEL DISPOSABLE) ×3 IMPLANT
TOWEL GREEN STERILE FF (TOWEL DISPOSABLE) ×3 IMPLANT
TRAY FOLEY SLVR 16FR TEMP STAT (SET/KITS/TRAYS/PACK) ×3 IMPLANT
TROCAR XCEL BLADELESS 5X75MML (TROCAR) ×3 IMPLANT
TROCAR XCEL NON-BLD 11X100MML (ENDOMECHANICALS) ×6 IMPLANT
TUBE SUCT INTRACARD DLP 20F (MISCELLANEOUS) ×3 IMPLANT
TUNNELER SHEATH ON-Q 11GX8 DSP (PAIN MANAGEMENT) ×1 IMPLANT
UNDERPAD 30X30 (UNDERPADS AND DIAPERS) ×3 IMPLANT
WATER STERILE IRR 1000ML POUR (IV SOLUTION) ×6 IMPLANT
WIRE .035 3MM-J 145CM (WIRE) ×3 IMPLANT

## 2019-03-07 NOTE — Progress Notes (Signed)
Patient ID: Alejandro Burnett, male   DOB: [birth year], 68 y.o.   MRN: [id number]  TCTS Evening Rounds:   Hemodynamically stable  CI = 1.8 AV paced 80  Extubated  Urine output good  CT output low  CBC    Component Value Date/Time   WBC 27.3 (H) 03/07/2019 1604   RBC 4.17 (L) 03/07/2019 1604   HGB 11.2 (L) 03/07/2019 1804   HGB 14.1 09/26/2018 1325   HCT 33.0 (L) 03/07/2019 1804   HCT 41.1 09/26/2018 1325   PLT 111 (L) 03/07/2019 1604   PLT 170 09/26/2018 1325   MCV 92.8 03/07/2019 1604   MCV 87 09/26/2018 1325   MCH 30.0 03/07/2019 1604   MCHC 32.3 03/07/2019 1604   RDW 12.2 03/07/2019 1604   RDW 12.2 09/26/2018 1325   LYMPHSABS 1.5 09/26/2018 1325   EOSABS 0.4 09/26/2018 1325   BASOSABS 0.0 09/26/2018 1325     BMET    Component Value Date/Time   NA 144 03/07/2019 1804   NA 140 09/26/2018 1325   K 4.2 03/07/2019 1804   CL 108 03/05/2019 1240   CO2 20 (L) 03/05/2019 1240   GLUCOSE 117 (H) 03/07/2019 1605   BUN 17 03/05/2019 1240   BUN 20 09/26/2018 1325   CREATININE 0.98 03/05/2019 1240   CALCIUM 8.9 03/05/2019 1240   GFRNONAA >60 03/05/2019 1240   GFRAA >60 03/05/2019 1240     A/P:  Stable postop course. Continue current plans

## 2019-03-07 NOTE — Anesthesia Procedure Notes (Signed)
Arterial Line Insertion Start/End7/15/2020 8:00 AM, 03/07/2019 8:10 AM Performed by: Lavell Luster, CRNA  Patient location: Pre-op. Preanesthetic checklist: patient identified, IV checked, site marked, risks and benefits discussed, surgical consent, monitors and equipment checked, pre-op evaluation, timeout performed and anesthesia consent Lidocaine 1% used for infiltration Left, radial was placed Catheter size: 20 Fr Hand hygiene performed  and maximum sterile barriers used   Attempts: 2 Procedure performed without using ultrasound guided technique. Following insertion, dressing applied. Post procedure assessment: normal and unchanged

## 2019-03-07 NOTE — Anesthesia Procedure Notes (Signed)
Procedure Name: Intubation Date/Time: 03/07/2019 8:59 AM Performed by: Clearnce Sorrel, CRNA Pre-anesthesia Checklist: Patient identified, Emergency Drugs available, Suction available, Patient being monitored and Timeout performed Patient Re-evaluated:Patient Re-evaluated prior to induction Oxygen Delivery Method: Circle system utilized Preoxygenation: Pre-oxygenation with 100% oxygen Induction Type: IV induction Ventilation: Mask ventilation without difficulty and Oral airway inserted - appropriate to patient size Laryngoscope Size: Mac and 4 Grade View: Grade I Tube type: Oral Endobronchial tube: Left and 41 Fr Number of attempts: 1 Airway Equipment and Method: Stylet Placement Confirmation: ETT inserted through vocal cords under direct vision,  positive ETCO2 and breath sounds checked- equal and bilateral Tube secured with: Tape Dental Injury: Teeth and Oropharynx as per pre-operative assessment

## 2019-03-07 NOTE — Brief Op Note (Addendum)
03/07/2019  1:23 PM  PATIENT:  Alejandro Burnett  68 y.o. male  PRE-OPERATIVE DIAGNOSIS:  mitral regurgitation and persistent atrial fibrillation  POST-OPERATIVE DIAGNOSIS:  mitral regurgitation and persistent atrial fibrillation  PROCEDURE:  Procedure(s): MINIMALLY INVASIVE MITRAL VALVE REPAIR (MVR) USING MEMO 4D SIZE 34 (Right) MINIMALLY INVASIVE MAZE PROCEDURE (N/A) TRANSESOPHAGEAL ECHOCARDIOGRAM (TEE) (N/A)  SURGEON:  Surgeon(s) and Role:    [name], MD - Primary  PHYSICIAN ASSISTANT: [place]   ANESTHESIA:   general  EBL:  1100 mL  BLOOD ADMINISTERED:none  DRAINS: Mediastinal and right pleural chest tubes   LOCAL MEDICATIONS USED:  NONE  SPECIMEN:  Source of Specimen:  Redundant portion of P2 segment of posterior mitral valve leaflet  DISPOSITION OF SPECIMEN:  PATHOLOGY  COUNTS:  YES  DICTATION: .Dragon Dictation  PLAN OF CARE: Admit to inpatient   PATIENT DISPOSITION:  ICU - intubated and hemodynamically stable.   Delay start of Pharmacological VTE agent (>24hrs) due to surgical blood loss or risk of bleeding: yes

## 2019-03-07 NOTE — Anesthesia Procedure Notes (Addendum)
Central Venous Catheter Insertion Performed by: Annye Asa, MD, anesthesiologist Start/End7/15/2020 7:54 AM, 03/07/2019 8:11 AM Patient location: Pre-op. Preanesthetic checklist: patient identified, IV checked, site marked, risks and benefits discussed, surgical consent, monitors and equipment checked, pre-op evaluation, timeout performed and anesthesia consent Position: supine Lidocaine 1% used for infiltration and patient sedated Hand hygiene performed , maximum sterile barriers used  and Seldinger technique used Catheter size: 8.5 Fr PA cath was placed.Sheath introducer Swan type:thermodilution Procedure performed using ultrasound guided technique. Ultrasound Notes:anatomy identified, needle tip was noted to be adjacent to the nerve/plexus identified, no ultrasound evidence of intravascular and/or intraneural injection and image(s) printed for medical record Attempts: 1 Following insertion, line sutured, dressing applied and Biopatch. Post procedure assessment: blood return through all ports, free fluid flow and no air  Post procedure complications: arrhythmia (transient PACs, PVCs). Patient tolerated the procedure well with no immediate complications. Additional procedure comments: PA catheter:  Routine monitors. Timeout, sterile prep, drape, FBP L neck.  Supine position.  1% Lido local, finder and trocar LIJ 1st pass with US guidance.  Cordis placed over J wire. PA catheter in easily.  Sterile dressing applied.  Patient tolerated well, VSS.  Jenita Seashore, MD.

## 2019-03-07 NOTE — Transfer of Care (Signed)
Immediate Anesthesia Transfer of Care Note  Patient: Alejandro Burnett  Procedure(s) Performed: MINIMALLY INVASIVE MITRAL VALVE REPAIR (MVR) USING MEMO 4D SIZE 34 (Right Chest) MINIMALLY INVASIVE MAZE PROCEDURE (N/A ) TRANSESOPHAGEAL ECHOCARDIOGRAM (TEE) (N/A )  Patient Location: SICU  Anesthesia Type:General  Level of Consciousness: awake and patient cooperative  Airway & Oxygen Therapy: Patient Spontanous Breathing and Patient connected to face mask oxygen  Post-op Assessment: Report given to RN and Post -op Vital signs reviewed and stable  Post vital signs: Reviewed and stable  Last Vitals:  Vitals Value Taken Time  BP 119/93 03/07/19 1600  Temp 36.4 C 03/07/19 1603  Pulse 79 03/07/19 1603  Resp 12 03/07/19 1603  SpO2 100 % 03/07/19 1603  Vitals shown include unvalidated device data.  Last Pain:  Vitals:   03/07/19 0726  PainSc: 0-No pain      Patients Stated Pain Goal: 4 (11/57/26 2035)  Complications: No apparent anesthesia complications

## 2019-03-07 NOTE — Anesthesia Postprocedure Evaluation (Signed)
Anesthesia Post Note  Patient: Alejandro Burnett  Procedure(s) Performed: MINIMALLY INVASIVE MITRAL VALVE REPAIR (MVR) USING MEMO 4D SIZE 34 (Right Chest) MINIMALLY INVASIVE MAZE PROCEDURE (N/A ) TRANSESOPHAGEAL ECHOCARDIOGRAM (TEE) (N/A )     Patient location during evaluation: SICU Anesthesia Type: General Level of consciousness: awake and alert, oriented and patient cooperative Pain management: pain level controlled Vital Signs Assessment: post-procedure vital signs reviewed and stable Respiratory status: spontaneous breathing, nonlabored ventilation, respiratory function stable and patient connected to nasal cannula oxygen (using incentive spirometer) Cardiovascular status: stable Postop Assessment: no apparent nausea or vomiting Anesthetic complications: no    Last Vitals:  Vitals:   03/07/19 0701 03/07/19 0726  BP: 119/70 129/73  Pulse: (!) 2 72  Temp: 36.4 C   SpO2: 96% 98%    Last Pain:  Vitals:   03/07/19 0726  PainSc: 0-No pain                 Dianna Ewald,E. Tamario Heal

## 2019-03-07 NOTE — Interval H&P Note (Signed)
History and Physical Interval Note:  03/07/2019 6:51 AM  Alejandro Burnett  has presented today for surgery, with the diagnosis of MR AFIB.  The various methods of treatment have been discussed with the patient and family. After consideration of risks, benefits and other options for treatment, the patient has consented to  Procedure(s): MINIMALLY INVASIVE MITRAL VALVE REPAIR (MVR) (Right) MINIMALLY INVASIVE MAZE PROCEDURE (N/A) TRANSESOPHAGEAL ECHOCARDIOGRAM (TEE) (N/A) as a surgical intervention.  The patient's history has been reviewed, patient examined, no change in status, stable for surgery.  I have reviewed the patient's chart and labs.  Questions were answered to the patient's satisfaction.     [name]

## 2019-03-07 NOTE — Progress Notes (Addendum)
Patient stated to intraoperative RN that emergency contact will be Lonia Farber and contact info is [phone]. This was verified with Clearnce Sorrel, CRNA.

## 2019-03-07 NOTE — Op Note (Signed)
CARDIOTHORACIC SURGERY OPERATIVE NOTE  Date of Procedure:   03/07/2019  Preoperative Diagnosis:    Severe Mitral Regurgitation  Recurrent Paroxysmal Atrial Fibrillation  Postoperative Diagnosis: Same  Procedure:    Minimally-Invasive Mitral Valve Repair  Complex valvuloplasty including triangular resection of middle scallop (P2) of posterior leaflet  Sorin Memo 4D Ring Annuloplasty (size 34mm, catalog #[id number], serial [id number])   Minimally-Invasive Maze Procedure  Complete bilateral atrial lesion set using cryothermy and bipolar radiofrequency ablation  Clipping of Left Atrial Appendage (Atricure Pro245 left atrial clip, size 45 mm)  Surgeon: Salvatore Decentlarence H. Cornelius Moraswen, MD  Assistant: B. Lorayne MarekZane Atkins, MD and Jillyn HiddenMyron Roddenberry, MD  Anesthesia: Germaine PomfretE. Carswell Jackson, MD  Operative Findings:  Fibroelastic deficiency type myxomatous degenerative disease  Ruptured primary chordae tendinae with flail segment (P2) of posterior leaflet  Type II dysfunction with severe mitral regurgitation  Normal left ventricular systolic function  No residual mitral regurgitation after successful valve repair                        BRIEF CLINICAL NOTE AND INDICATIONS FOR SURGERY  Patient is a [age] year old male referred for surgical consultation to discuss treatment options for management of mitral valve prolapse with severe mitral regurgitation.  Patient states that he has been told that he had a heart murmur on physical exam for at least the past 10 years. He states that this was initially discovered at his primary care physician's office and his murmur was reportedly difficult to appreciate. Over time the murmur has gotten louder. In 2016 he presented with symptoms of atypical chest pain. Echocardiogram at that time reportedly revealed mitral valve prolapse with moderate mitral regurgitation. Diagnostic cardiac catheterization was performed and reportedly revealed normal coronary  artery anatomy. The patient has been followed intermittently ever since by Dr. Dulce SellarMunley. Routine follow-up echocardiogram performed June 27, 2018 revealed normal left ventricular systolic function. There was mitral valve prolapse with what was felt to be mild mitral regurgitation. Recently the patient was started on oral Flomax because of benign prostatic hypertrophy. Shortly after that he began to experience occasional dizzy spells and worsening symptoms of shortness of breath. He reported these symptoms to Dr. Dulce SellarMunley when he was seen in follow-up on August 08, 2018. Because of progressive symptoms and prominent holosystolic murmur noted on physical exam, TEE was recommended. Patient underwent TEE on August 24, 2018 which revealed mitral valve prolapse with a flail segment involving a portion of the posterior leaflet and severe mitral regurgitation. Cardiothoracic surgical consultation was requested. He was originally seen in consultation on September 18, 2018. During hospitalization for diagnostic cardiac catheterization he was noted to be going in and out of paroxysmal atrial fibrillation, and more recently he has been anticoagulated using Xarelto.  We made tentative plans to proceed with minimally invasive mitral valve repair and Maze procedure in March, but surgery was postponed because of the COVID-19 pandemic. He was last seen here in our office on January 22, 2019. He reports no new problems or complaints. He specifically denies symptoms of exertional shortness of breath, chest discomfort, or palpitations. He has not had fevers, chills, productive cough. He has not been exposed to any persons with known or suspected COVID-19 infection. He stopped taking Xarelto last week in anticipation of surgery.  The patient has been seen in consultation and counseled at length regarding the indications, risks and potential benefits of surgery.  All questions have been answered, and the patient provides  full informed consent for  the operation as described.    DETAILS OF THE OPERATIVE PROCEDURE  Preparation:  The patient is brought to the operating room on the above mentioned date and central monitoring was established by the anesthesia team including placement of Swan-Ganz catheter through the left internal jugular vein.  A radial arterial line is placed. The patient is placed in the supine position on the operating table.  Intravenous antibiotics are administered. General endotracheal anesthesia is induced uneventfully. The patient is initially intubated using a dual lumen endotracheal tube.  A Foley catheter is placed.  Baseline transesophageal echocardiogram was performed.  Findings were notable for mitral valve prolapse with severe mitral regurgitation.  There was an obvious ruptured primary chordae tendinae involving a portion of the middle scallop of the posterior leaflet.  There was severe mitral regurgitation directed anteriorly around the left atrium.  There was normal left ventricular systolic function.  The right ventricle was mildly dilated.  There was normal right ventricular function.  There was trace to mild tricuspid regurgitation.  A soft roll is placed behind the patient's left scapula and the neck gently extended and turned to the left.   The patient's right neck, chest, abdomen, both groins, and both lower extremities are prepared and draped in a sterile manner. A time out procedure is performed.   Percutaneous Vascular Access:  Percutaneous arterial and venous access were obtained on the right side.  Using ultrasound guidance the right common femoral vein was cannulated using the Seldinger technique and a pair of Perclose vascular closure devises were placed at opposing 30 degree angles in the femoral vein, after which time an 8 French sheath inserted.  The right common femoral artery was cannulated using a micropuncture wire and sheath.  A pair of Perclose vascular closure  devices were placed at opposing 30 degree angles in the femoral artery, and a 8 French sheath inserted.  The right internal jugular vein was cannulated  using ultrasound guidance and an 8 French sheath inserted.     Surgical Approach:  A right miniature anterolateral thoracotomy incision is performed. The incision is placed just lateral to and superior to the right nipple. The pectoralis major muscle is retracted medially and completely preserved. The right pleural space is entered through the 4th intercostal space. A soft tissue retractor is placed.  Two 11 mm ports are placed through separate stab incisions inferiorly. The right pleural space is insufflated continuously with carbon dioxide gas through the posterior port during the remainder of the operation.  A pledgeted sutures placed through the dome of the right hemidiaphragm and retracted inferiorly to facilitate exposure.  A longitudinal incision is made in the pericardium 3 cm anterior to the phrenic nerve and silk traction sutures are placed on either side of the incision for exposure.   Extracorporeal Cardiopulmonary Bypass and Myocardial Protection:   The patient was heparinized systemically.  The right common femoral vein is cannulated through the venous sheath and a guidewire advanced into the right atrium using TEE guidance.  The femoral vein cannulated using a 22 Fr long femoral venous cannula.  The right common femoral artery is cannulated through the arterial sheath and a guidewire advanced into the descending thoracic aorta using TEE guidance.  Femoral artery is cannulated with a 18 French femoral arterial cannula.  The right internal jugular vein is cannulated through the venous sheath and a guidewire advanced into the right atrium.  The internal jugular vein is cannulated using a 14 French pediatric femoral venous cannula.  Adequate heparinization is verified.   The entire pre-bypass portion of the operation was notable for stable  hemodynamics.  Cardiopulmonary bypass was begun.  Vacuum assist venous drainage is utilized. The incision in the pericardium is extended in both directions. Venous drainage and exposure are notably excellent.    Clipping of Left Atrial Appendage:  The left atrial appendage is obliterated using an Atricure left atrial appendage clip (Atriclip Pro245, size 45 mm).  The clip is applied under thoracoscopic visualization posterior to the aorta and pulmonary artery through the oblique sinus.  The clip was applied prior to application of the aortic crossclamp, with transesophageal echocardiographic confirmation that the clip satisfactorily obliterates the appendage.   Myocardial Protection:  A retrograde cardioplegia cannula is placed through the right atrium into the coronary sinus using transesophageal echocardiogram guidance.  An antegrade cardioplegia cannula is placed in the ascending aorta.  The patient is cooled to 32C systemic temperature.  The aortic cross clamp is applied and cardioplegia is delivered initially in an antegrade fashion through the aortic root using modified [place] cold blood cardioplegia (Kennestone blood cardioplegia protocol).   The initial cardioplegic arrest is rapid with early diastolic arrest.  Myocardial protection was felt to be excellent.   Maze Procedure (left atrial lesion set):  Following placement of the aortic crossclamp and the administration of the initial arresting dose of cardioplegia, a left atriotomy incision was performed through the interatrial groove and extended partially across the back wall of the left atrium after opening the oblique sinus inferiorly.  The mitral valve and floor of the left atrium are exposed using a self-retaining retractor.    The Atricure CryoICE nitrous oxide cryothermy system is utilized for all cryothermy ablation lesions.  The left atrial lesion set of the Cox cryomaze procedure is performed using 3 minute duration for all  cryothermy lesions.  Initially a lesion is placed along the endocardial surface of the left atrium from the caudad apex of the atriotomy incision across the posterior wall of the left atrium onto the posterior mitral annulus.  A mirror image lesion along the epicardial surface is then performed with the probe posterior to the left atrium, crossing over the coronary sinus and extending up the epicardial surface of the posterolateral right atrium.  Two lesions are then performed to create a box isolating all of the pulmonary veins from the remainder of the left atrium.  The first lesion is placed from the cephalad apex of the atriotomy incision across the dome of the left atrium to just anterior to the left sided pulmonary veins.  The second lesion completes the box from the caudad apex of the atriotomy incision across the back wall of the left atrium to connect with the previous lesion just anterior to the left sided pulmonary veins.     Mitral Valve Repair:  The mitral valve was inspected and notable for fibroelastic deficiency type myxomatous degenerative disease with a single ruptured primary chordae tendinae causing severe prolapse involving a portion of the middle scallop (P2) of the posterior leaflet.  The remainder of the valve appeared essentially normal.  There was no calcification and no other complicating features.  Interrupted 2-0 Ethibond horizontal mattress sutures are placed circumferentially around the entire mitral valve annulus. The sutures will ultimately be utilized for ring annuloplasty, and at this juncture there are utilized to suspend the valve symmetrically.  The flail portion of the middle scallop of the posterior leaflet was repaired using a simple triangular resection.  A  total of approximately 20% of the total surface area of P2 was resected.  The intervening vertical defect in the posterior leaflet is closed using interrupted everting simple CV 5 Gore-Tex suture.  The valve is  tested with saline and appears reasonably competent even prior to ring annuloplasty.  The valve is sized to accept a 34mm annuloplasty ring based upon the distance between the left and right commissures, the height and the surface area of the anterior leaflet.  A Sorin Memo 4D annuloplasty ring (size 34mm, catalog # [id number]-34, serial # [id number]) is implanted uneventfully.  All ring sutures were secured using a Cor-knot device.  The valve is again tested with saline and appears to be perfectly competent with a broad symmetrical line of coaptation of the anterior and posterior leaflet. There is no residual leak. Rewarming is begun.  The atriotomy was closed using a 2-layer closure of running 3-0 Prolene suture after placing a sump drain across the mitral valve to serve as a left ventricular vent.  One final dose of warm retrograde "reanimation dose" cardioplegia was administered retrograde through the coronary sinus catheter while all air was evacuated through the aortic root.  The aortic cross clamp was removed after a total cross clamp time of 113 minutes.   Maze Procedure (right atrial lesion set):  The retrograde cardioplegia cannula was removed and the small hole in the right atrium extended a short distance.  The AtriCure Synergy bipolar radiofrequency ablation clamp is utilized to create a series of linear lesions in the right atrium, each with one limb of the clamp along the endocardial surface and the other along the epicardial surface. The first lesion is placed from the posterior apex of the atriotomy incision and along the lateral wall of the right atrium to reach the lateral aspect of the superior vena cava, connecting with the cryothermy lesion placed previously from the coronary sinus to the epicardial surface of the posterolateral right atrium. A second lesion is placed in the opposite direction from the posterior apex of the atriotomy incision along the lateral wall to reach the lateral aspect of  the inferior vena cava. A third lesion is placed from the midportion of the atriotomy incision extending at a right angle to reach the tip of the right atrial appendage. A fourth lesion is placed from the anterior apex of the atriotomy incision in an anterior and inferior direction to reach the acute margin of the heart. Finally, the cryotherapy probe is utilized to complete the right atrial lesion set by placing the probe along the endocardial surface of the right atrium from the anterior apex of the atriotomy incision to reach the tricuspid annulus at the 2:00 position. The atriotomy incision is closed with a 2 layer closure of running 4-0 Prolene suture.   Procedure Completion:  Epicardial pacing wires are fixed to the inferior wall of the right ventricule and to the right atrial appendage. The patient is rewarmed to 37C temperature.  During rewarming dark blood is noted to emanate from posterior to the heart.  With careful exposure through the oblique sinus a small hole in the coronary sinus is identified.  This is controlled using several interrupted vertical mattress pledgeted Prolene sutures.  The left ventricular vent and antegrade cardioplegia cannula are removed. The patient is weaned and disconnected from cardiopulmonary bypass.  The patient's rhythm at separation from bypass was junctional.  The patient was weaned from bypass without any inotropic support. Total cardiopulmonary bypass time for the operation was 213  minutes.  Followup transesophageal echocardiogram performed after separation from bypass revealed a well-seated annuloplasty ring in the mitral position with a normal functioning mitral valve. There was no residual leak.  Left ventricular function was unchanged from preoperatively.  Protamine was administered to reverse the anticoagulation.  The femoral arterial and venous cannulas were removed and all Perclose sutures secured.  The right internal jugular cannula was removed and  manual pressure held on the neck and right groin for 15 minutes.  Single lung ventilation was begun. The atriotomy closure was inspected for hemostasis. The pericardial sac was drained using a 28 French Bard drain placed through the anterior port incision.  The right pleural space is irrigated with saline solution and inspected for hemostasis.   A single lumen On-Q catheter is placed for postoperative analgesia.  The catheter is initially tunneled through the subcutaneous tissues to the posterior port incision and then passed through the chest wall and tunneled into the subpleural space to cover the 2nd through the 6th intercostal nerve roots posteriorly.  The catheter is flushed with 0.5% bupivacaine solution and ultimately connected to a continuous infusion pump.  The right pleural space was drained using a 28 French Bard drain placed through the posterior port incision. The miniature thoracotomy incision was closed in multiple layers in routine fashion. The right groin incision was inspected for hemostasis and closed in multiple layers in routine fashion.  The post-bypass portion of the operation was notable for stable rhythm and hemodynamics.  No blood products were administered during the operation.   Disposition:  The patient tolerated the procedure well.  The patient was extubated in the operating room and subsequently transported to the surgical intensive care unit in stable condition. There were no intraoperative complications. All sponge instrument and needle counts are verified correct at completion of the operation.    Salvatore Decentlarence H. Cornelius Moraswen MD 03/07/2019 3:29 PM

## 2019-03-08 ENCOUNTER — Inpatient Hospital Stay (HOSPITAL_COMMUNITY): Payer: Medicare Other

## 2019-03-08 ENCOUNTER — Encounter (HOSPITAL_COMMUNITY): Payer: Self-pay | Admitting: Thoracic Surgery (Cardiothoracic Vascular Surgery)

## 2019-03-08 LAB — GLUCOSE, CAPILLARY
Glucose-Capillary: 109 mg/dL — ABNORMAL HIGH (ref 70–99)
Glucose-Capillary: 110 mg/dL — ABNORMAL HIGH (ref 70–99)
Glucose-Capillary: 113 mg/dL — ABNORMAL HIGH (ref 70–99)
Glucose-Capillary: 114 mg/dL — ABNORMAL HIGH (ref 70–99)
Glucose-Capillary: 118 mg/dL — ABNORMAL HIGH (ref 70–99)
Glucose-Capillary: 118 mg/dL — ABNORMAL HIGH (ref 70–99)
Glucose-Capillary: 122 mg/dL — ABNORMAL HIGH (ref 70–99)
Glucose-Capillary: 124 mg/dL — ABNORMAL HIGH (ref 70–99)
Glucose-Capillary: 126 mg/dL — ABNORMAL HIGH (ref 70–99)
Glucose-Capillary: 127 mg/dL — ABNORMAL HIGH (ref 70–99)
Glucose-Capillary: 136 mg/dL — ABNORMAL HIGH (ref 70–99)
Glucose-Capillary: 141 mg/dL — ABNORMAL HIGH (ref 70–99)
Glucose-Capillary: 148 mg/dL — ABNORMAL HIGH (ref 70–99)
Glucose-Capillary: 154 mg/dL — ABNORMAL HIGH (ref 70–99)
Glucose-Capillary: 168 mg/dL — ABNORMAL HIGH (ref 70–99)
Glucose-Capillary: 170 mg/dL — ABNORMAL HIGH (ref 70–99)
Glucose-Capillary: 174 mg/dL — ABNORMAL HIGH (ref 70–99)
Glucose-Capillary: 72 mg/dL (ref 70–99)
Glucose-Capillary: 97 mg/dL (ref 70–99)

## 2019-03-08 LAB — POCT I-STAT 4, (NA,K, GLUC, HGB,HCT)
Glucose, Bld: 104 mg/dL — ABNORMAL HIGH (ref 70–99)
Glucose, Bld: 102 mg/dL — ABNORMAL HIGH (ref 70–99)
Glucose, Bld: 110 mg/dL — ABNORMAL HIGH (ref 70–99)
Glucose, Bld: 147 mg/dL — ABNORMAL HIGH (ref 70–99)
Glucose, Bld: 137 mg/dL — ABNORMAL HIGH (ref 70–99)
Glucose, Bld: 130 mg/dL — ABNORMAL HIGH (ref 70–99)
Glucose, Bld: 112 mg/dL — ABNORMAL HIGH (ref 70–99)
HCT: 36 % — ABNORMAL LOW (ref 39.0–52.0)
HCT: 33 % — ABNORMAL LOW (ref 39.0–52.0)
HCT: 35 % — ABNORMAL LOW (ref 39.0–52.0)
HCT: 30 % — ABNORMAL LOW (ref 39.0–52.0)
HCT: 30 % — ABNORMAL LOW (ref 39.0–52.0)
HCT: 30 % — ABNORMAL LOW (ref 39.0–52.0)
HCT: 33 % — ABNORMAL LOW (ref 39.0–52.0)
Hemoglobin: 12.2 g/dL — ABNORMAL LOW (ref 13.0–17.0)
Hemoglobin: 11.2 g/dL — ABNORMAL LOW (ref 13.0–17.0)
Hemoglobin: 11.9 g/dL — ABNORMAL LOW (ref 13.0–17.0)
Hemoglobin: 10.2 g/dL — ABNORMAL LOW (ref 13.0–17.0)
Hemoglobin: 10.2 g/dL — ABNORMAL LOW (ref 13.0–17.0)
Hemoglobin: 10.2 g/dL — ABNORMAL LOW (ref 13.0–17.0)
Hemoglobin: 11.2 g/dL — ABNORMAL LOW (ref 13.0–17.0)
Potassium: 3.9 mmol/L (ref 3.5–5.1)
Potassium: 3.9 mmol/L (ref 3.5–5.1)
Potassium: 4.1 mmol/L (ref 3.5–5.1)
Potassium: 4.7 mmol/L (ref 3.5–5.1)
Potassium: 4.4 mmol/L (ref 3.5–5.1)
Potassium: 3.7 mmol/L (ref 3.5–5.1)
Potassium: 3.4 mmol/L — ABNORMAL LOW (ref 3.5–5.1)
Sodium: 140 mmol/L (ref 135–145)
Sodium: 140 mmol/L (ref 135–145)
Sodium: 142 mmol/L (ref 135–145)
Sodium: 138 mmol/L (ref 135–145)
Sodium: 140 mmol/L (ref 135–145)
Sodium: 139 mmol/L (ref 135–145)
Sodium: 143 mmol/L (ref 135–145)

## 2019-03-08 LAB — POCT I-STAT 7, (LYTES, BLD GAS, ICA,H+H)
Acid-Base Excess: 2 mmol/L (ref 0.0–2.0)
Acid-base deficit: 2 mmol/L (ref 0.0–2.0)
Bicarbonate: 28.1 mmol/L — ABNORMAL HIGH (ref 20.0–28.0)
Bicarbonate: 23.7 mmol/L (ref 20.0–28.0)
Calcium, Ion: 1.09 mmol/L — ABNORMAL LOW (ref 1.15–1.40)
Calcium, Ion: 1.09 mmol/L — ABNORMAL LOW (ref 1.15–1.40)
HCT: 32 % — ABNORMAL LOW (ref 39.0–52.0)
HCT: 33 % — ABNORMAL LOW (ref 39.0–52.0)
Hemoglobin: 10.9 g/dL — ABNORMAL LOW (ref 13.0–17.0)
Hemoglobin: 11.2 g/dL — ABNORMAL LOW (ref 13.0–17.0)
O2 Saturation: 100 %
O2 Saturation: 100 %
Potassium: 4.1 mmol/L (ref 3.5–5.1)
Potassium: 3.5 mmol/L (ref 3.5–5.1)
Sodium: 141 mmol/L (ref 135–145)
Sodium: 142 mmol/L (ref 135–145)
TCO2: 30 mmol/L (ref 22–32)
TCO2: 25 mmol/L (ref 22–32)
pCO2 arterial: 48.2 mmHg — ABNORMAL HIGH (ref 32.0–48.0)
pCO2 arterial: 44.5 mmHg (ref 32.0–48.0)
pH, Arterial: 7.374 (ref 7.350–7.450)
pH, Arterial: 7.334 — ABNORMAL LOW (ref 7.350–7.450)
pO2, Arterial: 388 mmHg — ABNORMAL HIGH (ref 83.0–108.0)
pO2, Arterial: 187 mmHg — ABNORMAL HIGH (ref 83.0–108.0)

## 2019-03-08 LAB — CBC
HCT: 33.2 % — ABNORMAL LOW (ref 39.0–52.0)
HCT: 32.6 % — ABNORMAL LOW (ref 39.0–52.0)
Hemoglobin: 10.7 g/dL — ABNORMAL LOW (ref 13.0–17.0)
Hemoglobin: 10.5 g/dL — ABNORMAL LOW (ref 13.0–17.0)
MCH: 29.6 pg (ref 26.0–34.0)
MCH: 30.1 pg (ref 26.0–34.0)
MCHC: 32.2 g/dL (ref 30.0–36.0)
MCHC: 32.2 g/dL (ref 30.0–36.0)
MCV: 91.7 fL (ref 80.0–100.0)
MCV: 93.4 fL (ref 80.0–100.0)
Platelets: 79 10*3/uL — ABNORMAL LOW (ref 150–400)
Platelets: 68 10*3/uL — ABNORMAL LOW (ref 150–400)
RBC: 3.62 MIL/uL — ABNORMAL LOW (ref 4.22–5.81)
RBC: 3.49 MIL/uL — ABNORMAL LOW (ref 4.22–5.81)
RDW: 12.5 % (ref 11.5–15.5)
RDW: 12.7 % (ref 11.5–15.5)
WBC: 15.5 10*3/uL — ABNORMAL HIGH (ref 4.0–10.5)
WBC: 16.6 10*3/uL — ABNORMAL HIGH (ref 4.0–10.5)
nRBC: 0 % (ref 0.0–0.2)
nRBC: 0 % (ref 0.0–0.2)

## 2019-03-08 LAB — BASIC METABOLIC PANEL
Anion gap: 8 (ref 5–15)
Anion gap: 8 (ref 5–15)
BUN: 16 mg/dL (ref 8–23)
BUN: 19 mg/dL (ref 8–23)
CO2: 22 mmol/L (ref 22–32)
CO2: 22 mmol/L (ref 22–32)
Calcium: 7.7 mg/dL — ABNORMAL LOW (ref 8.9–10.3)
Calcium: 7.7 mg/dL — ABNORMAL LOW (ref 8.9–10.3)
Chloride: 108 mmol/L (ref 98–111)
Chloride: 103 mmol/L (ref 98–111)
Creatinine, Ser: 0.86 mg/dL (ref 0.61–1.24)
Creatinine, Ser: 0.99 mg/dL (ref 0.61–1.24)
GFR calc Af Amer: >60 mL/min (ref >60)
GFR calc Af Amer: >60 mL/min (ref >60)
GFR calc non Af Amer: >60 mL/min (ref >60)
GFR calc non Af Amer: >60 mL/min (ref >60)
Glucose, Bld: 126 mg/dL — ABNORMAL HIGH (ref 70–99)
Glucose, Bld: 208 mg/dL — ABNORMAL HIGH (ref 70–99)
Potassium: 4 mmol/L (ref 3.5–5.1)
Potassium: 4.1 mmol/L (ref 3.5–5.1)
Sodium: 138 mmol/L (ref 135–145)
Sodium: 133 mmol/L — ABNORMAL LOW (ref 135–145)

## 2019-03-08 LAB — MAGNESIUM
Magnesium: 2.6 mg/dL — ABNORMAL HIGH (ref 1.7–2.4)
Magnesium: 2.5 mg/dL — ABNORMAL HIGH (ref 1.7–2.4)

## 2019-03-08 MED ORDER — FINASTERIDE 5 MG PO TABS
5.0000 mg | ORAL_TABLET | Freq: Every day | ORAL | Status: DC
  Administered 2019-03-08 – 2019-03-12 (×5): 5 mg via ORAL
  Filled 2019-03-08 (×6): qty 1

## 2019-03-08 MED ORDER — SIMVASTATIN 20 MG PO TABS
40.0000 mg | ORAL_TABLET | Freq: Every day | ORAL | Status: DC
  Administered 2019-03-09 – 2019-03-12 (×4): 40 mg via ORAL
  Filled 2019-03-08 (×4): qty 2

## 2019-03-08 MED ORDER — ENOXAPARIN SODIUM 30 MG/0.3ML ~~LOC~~ SOLN: 30.0000 mg | Freq: Every day | SUBCUTANEOUS | Status: DC

## 2019-03-08 MED ORDER — MONTELUKAST SODIUM 10 MG PO TABS
10.0000 mg | ORAL_TABLET | Freq: Every day | ORAL | Status: DC
  Administered 2019-03-08 – 2019-03-13 (×6): 10 mg via ORAL
  Filled 2019-03-08 (×6): qty 1

## 2019-03-08 MED ORDER — KETOROLAC TROMETHAMINE 15 MG/ML IJ SOLN
15.0000 mg | Freq: Four times a day (QID) | INTRAMUSCULAR | Status: AC
  Administered 2019-03-08 (×2): 15 mg via INTRAVENOUS
  Filled 2019-03-08 (×3): qty 1

## 2019-03-08 MED ORDER — WARFARIN SODIUM 2.5 MG PO TABS
2.5000 mg | ORAL_TABLET | Freq: Every day | ORAL | Status: DC
  Administered 2019-03-08: 2.5 mg via ORAL
  Filled 2019-03-08: qty 1

## 2019-03-08 MED ORDER — INSULIN ASPART 100 UNIT/ML ~~LOC~~ SOLN
0.0000 [IU] | SUBCUTANEOUS | Status: DC
  Administered 2019-03-08 – 2019-03-09 (×3): 2 [IU] via SUBCUTANEOUS

## 2019-03-08 MED ORDER — FUROSEMIDE 10 MG/ML IJ SOLN
20.0000 mg | Freq: Four times a day (QID) | INTRAMUSCULAR | Status: AC
  Administered 2019-03-08 – 2019-03-09 (×4): 20 mg via INTRAVENOUS
  Filled 2019-03-08 (×4): qty 2

## 2019-03-08 MED ORDER — WARFARIN - PHYSICIAN DOSING INPATIENT
Freq: Every day | Status: DC
  Administered 2019-03-08 – 2019-03-10 (×3)

## 2019-03-08 MED FILL — Sodium Bicarbonate IV Soln 8.4%: INTRAVENOUS | Qty: 50 | Status: AC

## 2019-03-08 MED FILL — Heparin Sodium (Porcine) Inj 1000 Unit/ML: INTRAMUSCULAR | Qty: 20 | Status: AC

## 2019-03-08 MED FILL — Glutaral Soln 25%: TOPICAL | Qty: 50 | Status: AC

## 2019-03-08 MED FILL — Electrolyte-R (PH 7.4) Solution: INTRAVENOUS | Qty: 3000 | Status: AC

## 2019-03-08 MED FILL — Heparin Sodium (Porcine) Inj 1000 Unit/ML: INTRAMUSCULAR | Qty: 30 | Status: AC

## 2019-03-08 MED FILL — Mannitol IV Soln 20%: INTRAVENOUS | Qty: 500 | Status: AC

## 2019-03-08 MED FILL — Potassium Chloride Inj 2 mEq/ML: INTRAVENOUS | Qty: 40 | Status: AC

## 2019-03-08 MED FILL — Sodium Chloride IV Soln 0.9%: INTRAVENOUS | Qty: 2000 | Status: AC

## 2019-03-08 MED FILL — Lidocaine HCl Local Preservative Free (PF) Inj 2%: INTRAMUSCULAR | Qty: 15 | Status: AC

## 2019-03-08 NOTE — Progress Notes (Signed)
Patient ID: SIDI DZIKOWSKI, male   DOB: 02-14-1951, 68 y.o.   MRN: [id number] EVENING ROUNDS NOTE :     [address]       [place] [address]             [phone]                 1 Day Post-Op Procedure(s) (LRB): MINIMALLY INVASIVE MITRAL VALVE REPAIR (MVR) USING MEMO 4D SIZE 34 (Right) MINIMALLY INVASIVE MAZE PROCEDURE (N/A) TRANSESOPHAGEAL ECHOCARDIOGRAM (TEE) (N/A)  Total Length of Stay:  LOS: 1 day  BP 110/74   Pulse 69   Temp 97.9 F (36.6 C) (Oral)   Resp (!) 21   Ht 6\' 1"  (1.854 m)   Wt 88.2 kg   SpO2 94%   BMI 25.65 kg/m   .Intake/Output      07/16 0701 - 07/17 0700   P.O. 640   I.V. (mL/kg) 286.6 (3.2)   Blood    IV Piggyback 157.5   Total Intake(mL/kg) 1084.1 (12.3)   Urine (mL/kg/hr) 350 (0.3)   Emesis/NG output 0   Blood    Chest Tube 300   Total Output 650   Net +434.1       Emesis Occurrence 2 x     . sodium chloride    . cefUROXime (ZINACEF)  IV 200 mL/hr at 03/08/19 1900  . insulin Stopped (03/08/19 1441)  . lactated ringers    . lactated ringers Stopped (03/08/19 1842)     Lab Results  Component Value Date   WBC 16.6 (H) 03/08/2019   HGB 10.5 (L) 03/08/2019   HCT 32.6 (L) 03/08/2019   PLT 68 (L) 03/08/2019   GLUCOSE 208 (H) 03/08/2019   ALT 10 03/05/2019   AST 20 03/05/2019   NA 133 (L) 03/08/2019   K 4.1 03/08/2019   CL 103 03/08/2019   CREATININE 0.99 03/08/2019   BUN 19 03/08/2019   CO2 22 03/08/2019   INR 1.4 (H) 03/07/2019   HGBA1C 6.0 (H) 03/05/2019   Stable day sinus   Grace Isaac MD  Beeper [phone] Office [phone] 03/08/2019 7:56 PM

## 2019-03-08 NOTE — Progress Notes (Signed)
[facility] DAILY ICU PROGRESS NOTE                   [address]            [place] [id number]          [phone]   1 Day Post-Op Procedure(s) (LRB): MINIMALLY INVASIVE MITRAL VALVE REPAIR (MVR) USING MEMO 4D SIZE 34 (Right) MINIMALLY INVASIVE MAZE PROCEDURE (N/A) TRANSESOPHAGEAL ECHOCARDIOGRAM (TEE) (N/A)  Total Length of Stay:  LOS: 1 day   Subjective: Extubated early last evening. Awake and alert. No complaints.  He is on the insulin drip but requiring no pressor or inotropic support.   Objective: Vital signs in last 24 hours: Temp:  [97.5 F (36.4 C)-99.1 F (37.3 C)] 99 F (37.2 C) (07/16 0600) Pulse Rate:  [79-81] 80 (07/16 0600) Cardiac Rhythm: A-V Sequential paced (07/16 0400) Resp:  [11-23] 15 (07/16 0600) BP: (97-128)/(69-93) 101/70 (07/16 0600) SpO2:  [95 %-100 %] 96 % (07/16 0600) Arterial Line BP: (94-137)/(52-81) 110/53 (07/16 0600) Weight:  [88.2 kg] 88.2 kg (07/16 0500)  Filed Weights   03/07/19 0701 03/08/19 0500  Weight: 83.9 kg 88.2 kg    Weight change:    Hemodynamic parameters for last 24 hours: PAP: (19-38)/(6-23) 24/11 CO:  [3.4 L/min-5.8 L/min] 5.8 L/min CI:  [1.6 L/min/m2-2.8 L/min/m2] 2.8 L/min/m2  Intake/Output from previous day: 07/15 0701 - 07/16 0700 In: 6480.2 [P.O.:320; I.V.:3966.9; Blood:600; IV Piggyback:1593.3] Out: 3845 [Urine:1935; Blood:1100; Chest Tube:810]  Intake/Output this shift: No intake/output data recorded.  Current Meds: Scheduled Meds: . acetaminophen  1,000 mg Oral Q6H  . aspirin EC  325 mg Oral Daily  . bisacodyl  10 mg Oral Daily   Or  . bisacodyl  10 mg Rectal Daily  . Chlorhexidine Gluconate Cloth  6 each Topical Daily  . docusate sodium  200 mg Oral Daily  . [START ON 03/09/2019] enoxaparin (LOVENOX) injection  30 mg Subcutaneous QHS  . finasteride  5 mg Oral QHS  . insulin aspart  0-24 Units Subcutaneous Q4H  . ketorolac  15 mg Intravenous Q6H  . mouth rinse  15 mL Mouth Rinse BID   . montelukast  10 mg Oral Daily  . [START ON 03/09/2019] pantoprazole  40 mg Oral Daily  . [START ON 03/09/2019] simvastatin  40 mg Oral QHS  . sodium chloride flush  3 mL Intravenous Q12H  . warfarin  2.5 mg Oral q1800  . Warfarin - Physician Dosing Inpatient   Does not apply q1800   Continuous Infusions: . sodium chloride    . albumin human Stopped (03/07/19 2130)  . cefUROXime (ZINACEF)  IV 1.5 g (03/08/19 0801)  . insulin 2.3 mL/hr at 03/08/19 0600  . lactated ringers    . lactated ringers 20 mL/hr at 03/08/19 0600   PRN Meds:.albumin human, metoprolol tartrate, morphine injection, ondansetron (ZOFRAN) IV, oxyCODONE, sodium chloride flush, traMADol  General appearance: alert, cooperative and no distress Neurologic: intact Heart: Currently A-paced with appropriate capture. EKG this AM shows a slow bigeminal rhythm.  Lungs: Breath sounds clear, CT drainage moderate but is tapering off.  Abdomen: soft, non-tender,  Extremities: no edema, right femoral vascular access sites dry with no evidence of hematoma. Palpable distal pulses. Wound: Right chest incision covered with a dry dressing.  Lab Results: CBC: Recent Labs    03/07/19 2126 03/08/19 0501  WBC 16.6* 15.5*  HGB 11.1* 10.7*  HCT 34.3* 33.2*  PLT 86* 79*   BMET:  Recent  Labs    03/07/19 2126 03/08/19 0501  NA 140 138  K 4.6 4.0  CL 112* 108  CO2 21* 22  GLUCOSE 129* 126*  BUN 17 16  CREATININE 0.91 0.86  CALCIUM 7.6* 7.7*    CMET: Lab Results  Component Value Date   WBC 15.5 (H) 03/08/2019   HGB 10.7 (L) 03/08/2019   HCT 33.2 (L) 03/08/2019   PLT 79 (L) 03/08/2019   GLUCOSE 126 (H) 03/08/2019   ALT 10 03/05/2019   AST 20 03/05/2019   NA 138 03/08/2019   K 4.0 03/08/2019   CL 108 03/08/2019   CREATININE 0.86 03/08/2019   BUN 16 03/08/2019   CO2 22 03/08/2019   INR 1.4 (H) 03/07/2019   HGBA1C 6.0 (H) 03/05/2019      PT/INR:  Recent Labs    03/07/19 1604  LABPROT 17.3*  INR 1.4*    Radiology: Dg Chest Port 1 View  Result Date: 03/07/2019 CLINICAL DATA:  Postop mitral valve replacement EXAM: PORTABLE CHEST 1 VIEW COMPARISON:  03/05/2019 FINDINGS: Changes of mitral valve catheter in the replacement. Swan-Ganz central right pulmonary artery. Right chest tubes in place without pneumothorax. Bibasilar atelectasis. Mild cardiomegaly and vascular congestion. IMPRESSION: Postoperative changes with support devices as above. No pneumothorax. Bibasilar atelectasis, vascular congestion. Electronically Signed   By: Rolm Baptise M.D.   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 Hemodynamics stable since surgery, requiring no inotropic or pressor support. Will d/c p/a catheter and arterial line, mobilize. Leave CT's for drainage today. Initiate anticoagulation.   -History of atrial fibrillation- s/p MAZE and left atrial appendage clip. Currently  A-paced. Avoid B-blocker for now.   -Endo- HbgA1C 6.0,  glucose controlled with insulin drip since surgery, convert to SSI today.   -Thrombocytopenia- no indication for transfusion, monitor daily.   -History of BPH- Proscar resumed.  -History of asthma-no wheezing or respiratory distress. Maintenance therapy resumed.  -DVT PPX-continue SCD's for now. Add enoxaparin tomorrow if Plt count acceptable.   -Dyslipidemia-statin resumed.   Antony Odea, PA-C [phone] 03/08/2019 8:03 AM

## 2019-03-08 NOTE — Discharge Summary (Signed)
Physician Discharge Summary  Patient ID: Alejandro Burnett MRN: [id number] DOB/AGE: Aug 23, 1951 68 y.o.  Admit date: 03/07/2019 Discharge date: 03/13/2019  Admission Diagnoses: Mitral insufficiency Mitral valve prolapse Paroxysmal atrial fibrillation History of asthma Benign prostatic hyperplasia Dyslipidemia  Discharge Diagnoses:  Principal Problem:   S/P minimally invasive mitral valve repair + maze procedure  Mitral insufficiency Mitral valve prolapse Paroxysmal atrial fibrillation Frequent PVC's History of asthma Benign prostatic hyperplasia Dyslipidemia Expected acute blood loss anemia Thrombocytopenia   Discharged Condition: good  HPI:  Patient is a 68 year old male referred for surgical consultation to discuss treatment options for management of mitral valve prolapse with severe mitral regurgitation.  Patient states that he has been told that he had a heart murmur on physical exam for at least the past 10 years. He states that this was initially discovered at his primary care physician's office and his murmur was reportedly difficult to appreciate. Over time the murmur has gotten louder. In 2016 he presented with symptoms of atypical chest pain. Echocardiogram at that time reportedly revealed mitral valve prolapse with moderate mitral regurgitation. Diagnostic cardiac catheterization was performed and reportedly revealed normal coronary artery anatomy. The patient has been followed intermittently ever since by Dr. Dulce SellarMunley. Routine follow-up echocardiogram performed June 27, 2018 revealed normal left ventricular systolic function. There was mitral valve prolapse with what was felt to be mild mitral regurgitation. Recently the patient was started on oral Flomax because of benign prostatic hypertrophy. Shortly after that he began to experience occasional dizzy spells and worsening symptoms of shortness of breath. He reported these symptoms to Dr. Dulce SellarMunley when he was  seen in follow-up on August 08, 2018. Because of progressive symptoms and prominent holosystolic murmur noted on physical exam, TEE was recommended. Patient underwent TEE on August 24, 2018 which revealed mitral valve prolapse with a flail segment involving a portion of the posterior leaflet and severe mitral regurgitation. Cardiothoracic surgical consultation was requested.  Patient is married but his wife is severely debilitated from Alzheimer's type dementia and lives in a nursing home. He has a close friend who spends time with who accompanies him to our office for his consultation visit today. The patient has been retired for several years but remains remarkably active physically. 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 He has remained quite active physically all of his adult life. He still performs vigorous physical exertion on a daily basis. He only recently experienced symptoms of exertional shortness of breath with increased palpitations and 1 episode of dizziness. He states that the symptoms have all improved since he quit taking Flomax. He denies any history of resting shortness of breath, PND, orthopnea, or lower extremity edema. He has never had any exertional chest pain or chest tightness.  Interval History:  Office visit 03/05/19 Patient is a 68 year old male who returns to the office today for follow-up of mitral valve prolapse with stage D severe symptomatic primary mitral regurgitation and recurrent paroxysmal atrial fibrillation. He was originally seen in consultation on September 18, 2018. During hospitalization for diagnostic cardiac catheterization he was noted to be going in and out of paroxysmal atrial fibrillation, and more recently he has been anticoagulated using Xarelto.  We made tentative plans to proceed with minimally invasive mitral valve repair and Maze procedure in March, but surgery  was postponed because of the COVID-19 pandemic. He was last seen here in our office on January 22, 2019. He  reports no new problems or complaints. He specifically denies symptoms of exertional shortness of breath, chest discomfort, or palpitations. He has not had fevers, chills, productive cough. He has not been exposed to any persons with known or suspected COVID-19 infection. He stopped taking Xarelto last week in anticipation of surgery.  Hospital Course:  [name] was admitted for same-day surgery and taken to the OR where mini mitral valve repair along with a complete MAZE procedure and left atrial clipping were accomplished without complication. Following surgery, he was transferred to the to the CV ICU.  He remaind hemodynamically stable and did not require any inotropic support. He was extubatee the evening of the day of surgery and his respiratory status remained stable. On POD1, he was in a paced rhythm with an intrinsic slow bigeminal rhythm.  Monitoring lines were removed. He was mobilized with physical therapy and made excellent progress with mobility. He was transferred to 4E progressive care.  He required temporary pacing for a few days due to a slow junctional rhythm but had return of SR by POD4.  Pacing wires and chest tubes were removed on POD4.  He was anticoagulated with Coumadin and daily INR was monitored. The INR was 2.0 at the time of discharge. He was ambulating independently and having appropriate bowel and bladder function. Pain control was adequate on oral analgesics.   Consults: None  Significant Diagnostic Studies:   Transthoracic Echocardiography  Patient: Alejandro Burnett, Alejandro Burnett MR #: [id number] Study Date: 06/27/2018 Gender: M Age: 76 Height: 185.4 cm Weight: 88.7 kg BSA: 2.15 m^2 Pt. Status: Room:  REFERRING Ignacia Palma ATTENDING Norman Herrlich, MD ORDERING Norman Herrlich, MD REFERRING Norman Herrlich,  MD SONOGRAPHER Lanae Crumbly, RDCS PERFORMING [facility] [place]  cc:  ------------------------------------------------------------------- LV EF: 60% - 65%  ------------------------------------------------------------------- Indications: Mitral valve prolapse 424.0. Mitral regurgitation 424.0.  ------------------------------------------------------------------- History: PMH: Palpitations. Risk factors: Dyslipidemia.  ------------------------------------------------------------------- Study Conclusions  - Left ventricle: The cavity size was normal. Wall thickness was normal. Systolic function was normal. The estimated ejection fraction was in the range of 60% to 65%. Wall motion was normal; there were no regional wall motion abnormalities. - Aortic valve: There was trivial regurgitation. - Mitral valve: Prolapse. Posterior leaflet. There was mild regurgitation. - Left atrium: The atrium was moderately dilated.  Impressions:  - 1. Left ventricular systolic function is preserved visually estimated 60 to 65%. 2. Moderate left atrial enlargement 3. Prolapsed posterior leaflet of the mitral valve with eccentric mild mitral regurgitation directed towards the anteroseptum. 4. Trace aortic regurgitation.  ------------------------------------------------------------------- Study data: No prior study was available for comparison. Study status: Routine. Procedure: The patient reported no pain pre or post test. Transthoracic echocardiography. Image quality was adequate. Study completion: There were no complications. Transthoracic echocardiography. M-mode, complete 2D, spectral Doppler, and color Doppler. Birthdate: Patient birthdate: 1951-06-20. Age: Patient is 68 yr old. Sex: Gender: male. BMI: 25.8 kg/m^2. Blood pressure: 118/78 Patient status: Outpatient. Study date: Study date: 06/27/2018. Study time: 10:11 AM.  Location: Echo laboratory.  -------------------------------------------------------------------  ------------------------------------------------------------------- Left ventricle: The cavity size was normal. Wall thickness was normal. Systolic function was normal. The estimated ejection fraction was in the range of 60% to 65%. Wall motion was normal; there were no regional wall motion abnormalities.  ------------------------------------------------------------------- Aortic valve: Trileaflet; normal thickness leaflets. Mobility was not restricted. Doppler: Transvalvular velocity was within the normal range. There was no stenosis. There was trivial regurgitation.  ------------------------------------------------------------------- Aorta: Aortic root: The aortic root was normal in size.  -------------------------------------------------------------------  Mitral valve: Mobility was not restricted. Prolapse. Posterior leaflet. Doppler: Transvalvular velocity was within the normal range. There was no evidence for stenosis. There was mild regurgitation. Valve area by pressure half-time: 3.49 cm^2. Indexed valve area by pressure half-time: 1.63 cm^2/m^2. Peak gradient (D): 5 mm Hg.  ------------------------------------------------------------------- Left atrium: The atrium was moderately dilated.  ------------------------------------------------------------------- Right ventricle: The cavity size was normal. Wall thickness was normal. Systolic function was normal.  ------------------------------------------------------------------- Pulmonic valve: Doppler: Transvalvular velocity was within the normal range. There was no evidence for stenosis.  ------------------------------------------------------------------- Tricuspid valve: Structurally normal valve. Doppler: Transvalvular velocity was within the normal range. There was  no regurgitation.  ------------------------------------------------------------------- Pulmonary artery: The main pulmonary artery was normal-sized. Systolic pressure was within the normal range.  ------------------------------------------------------------------- Right atrium: The atrium was normal in size.  ------------------------------------------------------------------- Pericardium: There was no pericardial effusion.  ------------------------------------------------------------------- Systemic veins: Inferior vena cava: The vessel was normal in size.  ------------------------------------------------------------------- Measurements  Left ventricle Value Reference LV ID, ED, PLAX chordal (H) 62 mm 43 - 52 LV ID, ES, PLAX chordal (H) 39 mm 23 - 38 LV fx shortening, PLAX chordal 37 % >=29 LV PW thickness, ED 9 mm ---------- IVS/LV PW ratio, ED 1 <=1.3 LV e&', lateral 7.07 cm/s ---------- LV E/e&', lateral 16.12 ---------- LV e&', medial 7.94 cm/s ---------- LV E/e&', medial 14.36 ---------- LV e&', average 7.51 cm/s ---------- LV E/e&', average 15.19 ----------  Ventricular septum Value Reference IVS thickness, ED 9 mm ----------  LVOT Value Reference LVOT peak velocity, S 114 cm/s ---------- LVOT mean velocity, S 80.1 cm/s ---------- LVOT VTI, S 26.1 cm ---------- LVOT peak gradient, S 5 mm Hg  ----------  Aorta Value Reference Aortic root ID, ED 38 mm ---------- Ascending aorta ID, A-P, S 39 mm ----------  Left atrium Value Reference LA ID, A-P, ES 47 mm ---------- LA ID/bsa, A-P 2.19 cm/m^2 <=2.2 LA volume, S 113 ml ---------- LA volume/bsa, S 52.6 ml/m^2 ---------- LA volume, ES, 1-p A4C 102 ml ---------- LA volume/bsa, ES, 1-p A4C 47.5 ml/m^2 ---------- LA volume, ES, 1-p A2C 119 ml ---------- LA volume/bsa, ES, 1-p A2C 55.4 ml/m^2 ----------  Mitral valve Value Reference Mitral E-wave peak velocity 114 cm/s ---------- Mitral A-wave peak velocity 79.1 cm/s ---------- Mitral deceleration time 215 ms 150 - 230 Mitral pressure half-time 63 ms ---------- Mitral peak gradient, D 5 mm Hg ---------- Mitral E/A ratio, peak 1.4 ---------- Mitral valve area, PHT, DP 3.49 cm^2 ---------- Mitral valve area/bsa, PHT, DP 1.63 cm^2/m^2 ----------  Pulmonary arteries Value Reference PA pressure, S, DP 22 mm Hg <=30  Tricuspid valve Value Reference Tricuspid regurg peak velocity 220 cm/s ---------- Tricuspid peak RV-RA gradient 19 mm Hg ----------  Right atrium Value Reference RA ID, S-I, ES, A4C (H) 64.2 mm 34 - 49 RA area, ES, A4C 19.5 cm^2 8.3 -  19.5 RA volume, ES, A/L 50.6 ml ---------- RA volume/bsa, ES, A/L 23.6 ml/m^2 ----------  Systemic veins Value Reference Estimated CVP 3 mm Hg ----------  Right ventricle Value Reference TAPSE 25.8 mm ---------- RV pressure, S, DP 22 mm Hg <=30 RV s&', lateral, S 11.6 cm/s ----------  Legend: (L) and (H) mark values outside specified reference range.  ------------------------------------------------------------------- Prepared and Electronically Authenticated by  Belva Crome, MD 2019-11-05T12:39:13   Transesophageal Echocardiography  Patient: Alejandro Burnett, Alejandro Burnett MR #: [id number] Study Date: 08/24/2018 Gender: M Age: 20 Height: Weight: BSA: Pt. Status: Room:  Sherilyn Banker, MD REFERRING Norman Herrlich, MD SONOGRAPHER Lavenia Atlas,  RCS ADMITTING Thurmon FairMihai Croitoru, MD ATTENDING Thurmon FairMihai Croitoru, MD PERFORMING Thurmon FairMihai Croitoru, MD  cc:  ------------------------------------------------------------------- LV EF: 60% - 65%  ------------------------------------------------------------------- Indications: Mitral regurgitation 424.0.  ------------------------------------------------------------------- History: PMH: Mitral valve disease.  ------------------------------------------------------------------- Study Conclusions  - Left ventricle: Systolic function was normal. The estimated ejection fraction was in the range of 60% to 65%. Wall motion was normal; there were no regional wall motion abnormalities. - Mitral valve: Mild diffuse thickening, consistent with myxomatous proliferation. Moderate, late systolicprolapse, involving the middle  scallop of the posterior leaflet. Moderate flail motion involving the middle scallop (P2) of the posterior leaflet due to rupture of one or more chords. There was moderate to severe regurgitation directed eccentrically and toward the septum. There is no evidence of systolic pulmonary flow reversal. - Left atrium: No evidence of thrombus in the atrial cavity or appendage. - Atrial septum: No defect or patent foramen ovale was identified.  ------------------------------------------------------------------- Study data: Study status: Routine. Consent: The risks, benefits, and alternatives to the procedure were explained to the patient and informed consent was obtained. Procedure: The patient reported no pain pre or post test. Initial setup. The patient was brought to the laboratory. Surface ECG leads were monitored. Sedation. Conscious sedation was administered by cardiology staff. Transesophageal echocardiography. Topical anesthesia was obtained using viscous lidocaine. A transesophageal probe was inserted by the attending cardiologistwithout difficulty. Image quality was adequate. Study completion: The patient tolerated the procedure well. There were no complications. Administered medications: Midazolam, 4mg , IV. Fentanyl, 50mcg, IV. Diagnostic transesophageal echocardiography. 2D and color Doppler. Birthdate: Patient birthdate: 08/14/1951. Age: Patient is 68 yr old. Sex: Gender: male. Blood pressure: 111/68 Patient status: Outpatient. Study date: Study date: 08/24/2018. Study time: 11:48 AM. Location: Endoscopy.  -------------------------------------------------------------------  ------------------------------------------------------------------- Left ventricle: Systolic function was normal. The estimated ejection fraction was in the range of 60% to 65%. Wall motion was normal; there were no regional wall motion  abnormalities.  ------------------------------------------------------------------- Aortic valve: Structurally normal valve. Trileaflet; normal thickness leaflets. Cusp separation was normal. Doppler: There was trivial regurgitation.  ------------------------------------------------------------------- Aorta: There was no atheroma. There was no evidence for dissection. Aortic root: The aortic root was not dilated. Ascending aorta: The ascending aorta was normal in size. Aortic arch: The aortic arch was normal in size. Descending aorta: The descending aorta was normal in size.  ------------------------------------------------------------------- Mitral valve: Mild diffuse thickening, consistent with myxomatous proliferation. Leaflet separation was normal. Moderate, late systolicprolapse, involving the middle scallop of the posterior leaflet. Moderate flail motion involving the middle scallop of the posterior leaflet due to rupture of one or more chords. The ruptured chord is located towards the medial part of the middle scallop (bordering P3). Doppler: There was moderate to severe regurgitation directed eccentrically and toward the septum.  ------------------------------------------------------------------- Left atrium: The atrium was normal in size. No evidence of thrombus in the atrial cavity or appendage. The appendage was morphologically a left appendage, multilobulated, and of normal size. Emptying velocity was normal.  ------------------------------------------------------------------- Atrial septum: No defect or patent foramen ovale was identified.  ------------------------------------------------------------------- Right ventricle: The cavity size was normal. Wall thickness was normal. Systolic function was normal.  ------------------------------------------------------------------- Pulmonic valve: Structurally normal valve. Cusp separation  was normal. No evidence of vegetation. Doppler: There was trivial regurgitation.  ------------------------------------------------------------------- Tricuspid valve: Structurally normal valve. Leaflet separation was normal. Doppler: There was no significant regurgitation.  ------------------------------------------------------------------- Pulmonary artery: The main pulmonary artery was normal-sized.  ------------------------------------------------------------------- Right atrium: The atrium was normal in size. No evidence of thrombus in the atrial  cavity or appendage. The appendage was morphologically a right appendage.  ------------------------------------------------------------------- Pericardium: There was no pericardial effusion.  ------------------------------------------------------------------- Measurements  LVOT Value LVOT ID, S 22.7 mm LVOT area 4.05 cm^2 LVOT peak velocity, S 89.15 cm/s LVOT VTI, S 15.74 cm Stroke volume (SV), LVOT DP 63.7 ml  Mitral valve Value Aliasing velocity, MR PISA 35.1 cm/s Mitral regurg PISA radius 10 mm Mitral maximal regurg velocity, PISA 459.3 cm/s Mitral regurg VTI, PISA 107.81 cm Mitral ERO, PISA 0.48 cm^2 Mitral regurg volume, PISA 51.8 ml Mitral regurg fraction, PISA 44.83 %  Legend: (L) and (H) mark values outside specified reference range.  ------------------------------------------------------------------- Prepared and Electronically Authenticated by  Thurmon Fair, MD 2020-01-02T15:55:25   RIGHT/LEFT HEART CATH AND CORONARY ANGIOGRAPHY  Conclusion   1. Angiographically  normal coronary arteries (right dominant) 2. Low intracardiac filling pressures 3. Systemic hypotension during procedure related to conscious sedation - pt remained cognitively intact and stable throughout. BP normalized after sedation wore off.  Recommendations: The patient will continue preoperative evaluation for minimally invasive mitral valve repair. He has normal coronary arteries. He is incidentally noted to be in atrial fibrillation with mildly increased ventricular rate. I will start him on a low-dose of metoprolol and he has scheduled follow-up with Dr. Cornelius Moras next week. Will not start him on anticoagulation since he will have surgery in the near future requiring temporary discontinuation of anticoagulant drugs.  Indications   Severe mitral regurgitation [I34.0 (ICD-10-CM)]  Procedural Details   Technical Details INDICATION: Severe mitral regurgitation, preoperative study  PROCEDURAL DETAILS: There was an indwelling IV in a right antecubital vein. Using normal sterile technique, the IV was changed out for a 5 Fr brachial sheath over a 0.018 inch wire. The right wrist was then prepped, draped, and anesthetized with 1% lidocaine. Using the modified Seldinger technique a 5/6 French Slender sheath was placed in the right radial artery. Intra-arterial verapamil was administered through the radial artery sheath. IV heparin was administered after a JR4 catheter was advanced into the central aorta. A Swan-Ganz catheter was used for the right heart catheterization. Standard protocol was followed for recording of right heart pressures and sampling of oxygen saturations. Fick cardiac output was calculated. Standard Judkins catheters were used for selective coronary angiography. LV pressure is recorded and an aortic valve pullback is performed. There were no immediate procedural complications. The patient was transferred to the post catheterization recovery area for further  monitoring.    Estimated blood loss <50 mL.   During this procedure medications were administered to achieve and maintain moderate conscious sedation while the patient's heart rate, blood pressure, and oxygen saturation were continuously monitored and I was present face-to-face 100% of this time.  Medications  (Filter: Administrations occurring from 10/09/18 1222 to 10/09/18 1329)          Medication Rate/Dose/Volume Action  Date Time   midazolam (VERSED) injection (mg) 2 mg Given 10/09/18 1250   Total dose as of 10/16/18 1542        2 mg        fentaNYL (SUBLIMAZE) injection (mcg) 25 mcg Given 10/09/18 1251   Total dose as of 10/16/18 1542        25 mcg        lidocaine (PF) (XYLOCAINE) 1 % injection (mL) 2 mL Given 10/09/18 1256   Total dose as of 10/16/18 1542 2 mL Given 1258   4 mL        Heparin (Porcine) in NaCl 1000-0.9 UT/500ML-% SOLN (  mL) 500 mL Given 10/09/18 1256   Total dose as of 10/16/18 1542 500 mL Given 1257   1,000 mL        Radial Cocktail/Verapamil only (mL) 10 mL Given 10/09/18 1259   Total dose as of 10/16/18 1542        10 mL        metoprolol tartrate (LOPRESSOR) injection (mg) 5 mg Given 10/09/18 1253   Total dose as of 10/16/18 1542        5 mg        heparin injection (Units) 4,000 Units Given 10/09/18 1310   Total dose as of 10/16/18 1542        4,000 Units        iohexol (OMNIPAQUE) 350 MG/ML injection (mL) 30 mL Given 10/09/18 1318   Total dose as of 10/16/18 1542        30 mL        Sedation Time   Sedation Time Physician-1: 23 minutes 40 seconds  Coronary Findings   Diagnostic  Dominance: Right  Left Main  Vessel is large.  Left Anterior Descending  The LAD is a large vessel that reaches the apex. The first diagonal is large and divides into twin vessels. There is no stenosis throughout the LAD or diagonal branches.  Left Circumflex   Vessel is moderate in size. Vessel is angiographically normal. The circumflex is patent without stenosis.  Right Coronary Artery  Vessel is large. Vessel is angiographically normal. Large, dominant RCA without stenosis. The PDA and PLA branches are widely patent.  Intervention   No interventions have been documented.  Coronary Diagrams   Diagnostic  Dominance: Right     Treatments:  CARDIOTHORACIC SURGERY OPERATIVE NOTE  Date of Procedure:                            03/07/2019  Preoperative Diagnosis:         Severe Mitral Regurgitation  Recurrent Paroxysmal Atrial Fibrillation  Postoperative Diagnosis:    Same  Procedure:        Minimally-Invasive Mitral Valve Repair             Complex valvuloplasty including triangular resection of middle scallop (P2) of posterior leaflet             Sorin Memo 4D Ring Annuloplasty (size 34mm, catalog #[id number], serial [id number])   Minimally-Invasive Maze Procedure             Complete bilateral atrial lesion set using cryothermy and bipolar radiofrequency ablation             Clipping of Left Atrial Appendage (Atricure Pro245 left atrial clip, size 45 mm)  Surgeon:        Salvatore Decent. Cornelius Moras, MD  Assistant:       B. Lorayne Marek, MD and Jillyn Hidden, MD  Anesthesia:    Germaine Pomfret, MD  Operative Findings: ? Fibroelastic deficiency type myxomatous degenerative disease ? Ruptured primary chordae tendinae with flail segment (P2) of posterior leaflet ? 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 Normal left ventricular systolic function ? 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  Patient states that he  has been told that he had a heart murmur on physical exam for at least the past 10 years. He states that this was initially discovered at his primary care physician's office and his murmur was reportedly difficult to appreciate. Over time the murmur has gotten louder. In 2016 he presented with symptoms of atypical chest pain. Echocardiogram at that time reportedly revealed mitral valve prolapse with moderate mitral regurgitation. Diagnostic cardiac catheterization was performed and reportedly revealed normal coronary artery anatomy. The patient has been followed intermittently ever since by Dr. Dulce Sellar. Routine follow-up echocardiogram performed June 27, 2018 revealed normal left ventricular systolic function. There was mitral valve prolapse with what was felt to be mild mitral regurgitation. Recently the patient was started on oral Flomax because of benign prostatic hypertrophy. Shortly after that he began to experience occasional dizzy spells and worsening symptoms of shortness of breath. He reported these symptoms to Dr. Dulce Sellar when he was seen in follow-up on August 08, 2018. Because of progressive symptoms and prominent holosystolic murmur noted on physical exam, TEE was recommended. Patient underwent TEE on August 24, 2018 which revealed mitral valve prolapse with a flail segment involving a portion of the posterior leaflet and severe mitral regurgitation. Cardiothoracic surgical consultation was requested. He was originally seen in consultation on September 18, 2018.During hospitalization for diagnostic cardiac catheterization he was noted to be going in and out of paroxysmal atrial fibrillation, and more recently he has been anticoagulated using Xarelto.We made tentative plans to proceed with minimally invasive mitral valve repair and Maze procedure in March, but surgery was postponed because of the COVID-19 pandemic.He was last seen here in our office on January 22, 2019. He reports no  new problems or complaints. He specifically denies symptoms of exertional shortness of breath, chest discomfort, or palpitations. He has not had fevers, chills, productive cough. He has not been exposed to any persons with known or suspected COVID-19 infection. He stopped taking Xarelto last week in anticipation of surgery.  The patient has been seen in consultation and counseled at length regarding the indications, risks and potential benefits of surgery.  All questions have been answered, and the patient provides full informed consent for the operation as described.    Discharge Exam: Blood pressure 115/66, pulse 79, temperature 98.2 F (36.8 C), temperature source Oral, resp. rate 16, height 6\' 1"  (1.854 m), weight 82.6 kg, SpO2 96 %.  General appearance:alert, cooperative and no distress Neurologic:intact Heart: in SR with frequent PVC's Lungs:clear to auscultation bilaterally Abdomen:Soft and non-tender. Extremities:No peripheral edema, all are well perfused. Wound:rigth chest incision is intact and dry. The right groin vascular access sites are clean and dry with no hematoma.   Disposition:    Allergies as of 03/13/2019      Reactions   Apple    Migraines   Banana    Migraines   Peanut-containing Drug Products    Migraines    Strawberry (diagnostic)    Migraines   Tamsulosin    Goes into afib       Medication List    STOP taking these medications   Fish Oil 1200 MG Caps   ibuprofen 200 MG tablet Commonly known as: ADVIL   Lutein 20 Caps   rivaroxaban 20 MG Tabs tablet Commonly known as: Xarelto     TAKE these medications   Advair HFA 230-21 MCG/ACT inhaler Generic drug: fluticasone-salmeterol Inhale 2 puffs into the lungs 2 (  two) times daily as needed (shortness of breath).   albuterol 108 (90 Base) MCG/ACT inhaler Commonly known as: VENTOLIN HFA Inhale 1-2 puffs into the lungs every 6 (six) hours as needed for wheezing or shortness of  breath.   ALLERGY EYE DROPS OP Place 1 drop into both eyes daily as needed (allergies).   amoxicillin 500 MG capsule Commonly known as: AMOXIL Take 4 capsules 45 minutes prior to dental procedures. What changed:   how much to take  how to take this  when to take this  additional instructions   Efudex 5 % cream Generic drug: fluorouracil Apply 1 application topically 2 (two) times daily as needed (skin spots).   esomeprazole 40 MG capsule Commonly known as: NEXIUM Take 40 mg by mouth at bedtime.   finasteride 5 MG tablet Commonly known as: PROSCAR Take 5 mg by mouth at bedtime.   fluticasone 50 MCG/ACT nasal spray Commonly known as: FLONASE Place 1 spray into both nostrils daily as needed for allergies or rhinitis.   metFORMIN 500 MG tablet Commonly known as: GLUCOPHAGE Take 500 mg by mouth daily.   metoprolol tartrate 25 MG tablet Commonly known as: LOPRESSOR Take 0.5 tablets (12.5 mg total) by mouth 2 (two) times daily.   montelukast 10 MG tablet Commonly known as: SINGULAIR Take 10 mg by mouth daily.   multivitamin with minerals Tabs tablet Take 1 tablet by mouth daily.   silver sulfADIAZINE 1 % cream Commonly known as: SILVADENE Apply 1 application topically daily as needed (eczema).   simvastatin 40 MG tablet Commonly known as: ZOCOR Take 40 mg by mouth at bedtime.   traMADol 50 MG tablet Commonly known as: ULTRAM Take 1 tablet (50 mg total) by mouth every 4 (four) hours as needed for up to 7 days for moderate pain.   triamcinolone cream 0.1 % Commonly known as: KENALOG Apply 1 application topically daily as needed (eczema). Mixed with eucerin   warfarin 2.5 MG tablet Commonly known as: Coumadin Take 2 tablets (5 mg total) by mouth daily. Take two tablets daily or as instructed.      Follow-up Information    Richardo Priest, MD Follow up on 03/28/2019.   Specialty: Cardiology Why: You have a cardiology follow up appointment with Dr. Bettina Gavia  on Wednesday 03/28/19 at [place] information: [place] [address] [phone]        [facility] [place]. Go on 04/04/2019.   Specialty: Cardiothoracic Surgery Why: You have an appointment at Dr. Guy Sandifer office on Wednsesday 04/04/19 at 1:00.  Please arrive 30 minutes early for a chest x-ray to be performed at [place] located on the 1st floor of the same building.  Contact information: [place], [address] [place] Office Follow up on 03/16/2019.   Specialty: Cardiology Why: You have an appointment with the [facility] in [place] for a PT / INR (blood test)  on Friday 03/16/19 at 10:30. Contact information: [address], [place] [phone]         The patient has been discharged on:   1.Beta Blocker:  Yes [ x  ]                              No   [   ]  If No, reason:  2.Ace Inhibitor/ARB: Yes [   ]                                     No  [  x ]                                     If No, reason: Not indicated  3.Statin:   Yes [ x  ]                  No  [   ]                  If No, reason:  4.Ecasa:  Yes  [  x ]                  No   [   ]                  If No, reason:    Signed: Leary Roca, PA-C 03/13/2019, 9:21 AM

## 2019-03-09 ENCOUNTER — Inpatient Hospital Stay (HOSPITAL_COMMUNITY): Payer: Medicare Other

## 2019-03-09 LAB — BASIC METABOLIC PANEL
Anion gap: 5 (ref 5–15)
BUN: 19 mg/dL (ref 8–23)
CO2: 26 mmol/L (ref 22–32)
Calcium: 7.9 mg/dL — ABNORMAL LOW (ref 8.9–10.3)
Chloride: 104 mmol/L (ref 98–111)
Creatinine, Ser: 1.03 mg/dL (ref 0.61–1.24)
GFR calc Af Amer: >60 mL/min (ref >60)
GFR calc non Af Amer: >60 mL/min (ref >60)
Glucose, Bld: 139 mg/dL — ABNORMAL HIGH (ref 70–99)
Potassium: 4.5 mmol/L (ref 3.5–5.1)
Sodium: 135 mmol/L (ref 135–145)

## 2019-03-09 LAB — CBC
HCT: 31.2 % — ABNORMAL LOW (ref 39.0–52.0)
Hemoglobin: 10.1 g/dL — ABNORMAL LOW (ref 13.0–17.0)
MCH: 29.9 pg (ref 26.0–34.0)
MCHC: 32.4 g/dL (ref 30.0–36.0)
MCV: 92.3 fL (ref 80.0–100.0)
Platelets: 64 10*3/uL — ABNORMAL LOW (ref 150–400)
RBC: 3.38 MIL/uL — ABNORMAL LOW (ref 4.22–5.81)
RDW: 12.7 % (ref 11.5–15.5)
WBC: 14.7 10*3/uL — ABNORMAL HIGH (ref 4.0–10.5)
nRBC: 0 % (ref 0.0–0.2)

## 2019-03-09 LAB — PROTIME-INR
INR: 1.4 — ABNORMAL HIGH (ref 0.8–1.2)
Prothrombin Time: 16.7 seconds — ABNORMAL HIGH (ref 11.4–15.2)

## 2019-03-09 LAB — GLUCOSE, CAPILLARY
Glucose-Capillary: 145 mg/dL — ABNORMAL HIGH (ref 70–99)
Glucose-Capillary: 143 mg/dL — ABNORMAL HIGH (ref 70–99)
Glucose-Capillary: 170 mg/dL — ABNORMAL HIGH (ref 70–99)
Glucose-Capillary: 122 mg/dL — ABNORMAL HIGH (ref 70–99)
Glucose-Capillary: 139 mg/dL — ABNORMAL HIGH (ref 70–99)
Glucose-Capillary: 161 mg/dL — ABNORMAL HIGH (ref 70–99)

## 2019-03-09 MED ORDER — WARFARIN SODIUM 5 MG PO TABS
5.0000 mg | ORAL_TABLET | Freq: Every day | ORAL | Status: DC
  Administered 2019-03-09 – 2019-03-11 (×3): 5 mg via ORAL
  Filled 2019-03-09 (×3): qty 1

## 2019-03-09 MED ORDER — SODIUM CHLORIDE 0.9 % IV SOLN
INTRAVENOUS | Status: DC | PRN
  Administered 2019-03-09: 250 mL via INTRAVENOUS

## 2019-03-09 MED ORDER — ASPIRIN EC 81 MG PO TBEC
81.0000 mg | DELAYED_RELEASE_TABLET | Freq: Every day | ORAL | Status: DC
  Administered 2019-03-09 – 2019-03-12 (×4): 81 mg via ORAL
  Filled 2019-03-09 (×4): qty 1

## 2019-03-09 MED ORDER — COUMADIN BOOK
Freq: Once | Status: AC
  Administered 2019-03-09: 16:00:00
  Filled 2019-03-09: qty 1

## 2019-03-09 MED ORDER — FUROSEMIDE 40 MG PO TABS
40.0000 mg | ORAL_TABLET | Freq: Every day | ORAL | Status: DC
  Administered 2019-03-10 – 2019-03-13 (×4): 40 mg via ORAL
  Filled 2019-03-09 (×4): qty 1

## 2019-03-09 MED ORDER — MOVING RIGHT ALONG BOOK
Freq: Once | Status: AC
  Administered 2019-03-09: 07:00:00
  Filled 2019-03-09: qty 1

## 2019-03-09 NOTE — Progress Notes (Signed)
Notified [place], [place] of patient's cxr results stating "new trace right apical pneumothorax." Chest tubes to remain in place today. Patient is stable with no s/s distress.  Joellen Jersey, RN

## 2019-03-09 NOTE — Research (Signed)
TRAC-AF Registry Informed Consent   Subject Name: Alejandro Burnett  The informed consent form, registry requirements and expectations were reviewed with the subject and questions and concerns were addressed prior to the signing of the consent form.  The subject verbalized understanding of the  requirements.  The subject agreed to participate in the [place] and signed the informed consent.  The informed consent was obtained prior to performance of any protocol-specific procedures for the subject.  A copy of the signed informed consent was given to the subject.   [name] 03/09/2019, 3:21 PM

## 2019-03-09 NOTE — Progress Notes (Signed)
     [address]       [place] [address]             [phone]       POD 2 s/p mini MVR/MAZE Doing well, complains of some pain  Vitals:   03/09/19 1551 03/09/19 1600  BP:  120/68  Pulse:  70  Resp:  18  Temp: 98.5 F (36.9 C)   SpO2:  93%   HR in 70s SS CT output  Doing well Will check underlying rhythm No BB

## 2019-03-09 NOTE — Discharge Instructions (Signed)
Discharge Instructions:  1. You may shower, please wash incisions daily with soap and water and keep dry.  If you wish to cover wounds with dressing you may do so but please keep clean and change daily.  No tub baths or swimming until incisions have completely healed.  If your incisions become red or develop any drainage please call our office at [phone]  2. No Driving until cleared by [facility]'s office and you are no longer using narcotic pain medications  3. Monitor your weight daily.. Please use the same scale and weigh at same time... If you gain 5-10 lbs in 48 hours with associated lower extremity swelling, please contact our office at [phone]  4. Fever of 101.5 for at least 24 hours with no source, please contact our office at [phone]  5. Activity- up as tolerated, please walk at least 3 times per day.  Avoid strenuous activity, no lifting, pushing, or pulling with your arms over 8-10 lbs for a minimum of 6 weeks  6. If any questions or concerns arise, please do not hesitate to contact our office at [phone]  Information on my medicine - Coumadin   (Warfarin)  This medication education was reviewed with me or my healthcare representative as part of my discharge preparation.  The pharmacist that spoke with me during my hospital stay was:  Georgina Peer, [facility]  Why was Coumadin prescribed for you? Coumadin was prescribed for you because you have a blood clot or a medical condition that can cause an increased risk of forming blood clots. Blood clots can cause serious health problems by blocking the flow of blood to the heart, lung, or brain. Coumadin can prevent harmful blood clots from forming. As a reminder your indication for Coumadin is:   Blood Clot Prevention After Heart Valve Surgery  What test will check on my response to Coumadin? While on Coumadin (warfarin) you will need to have an INR test regularly to ensure that your dose is keeping you in the desired range.  The INR (international normalized ratio) number is calculated from the result of the laboratory test called prothrombin time (PT).  If an INR APPOINTMENT HAS NOT ALREADY BEEN MADE FOR YOU please schedule an appointment to have this lab work done by your health care provider within 7 days. Your INR goal is usually a number between:  2 to 3 or your provider may give you a more narrow range like 2-2.5.  Ask your health care provider during an office visit what your goal INR is.  What  do you need to  know  About  COUMADIN? Take Coumadin (warfarin) exactly as prescribed by your healthcare provider about the same time each day.  DO NOT stop taking without talking to the doctor who prescribed the medication.  Stopping without other blood clot prevention medication to take the place of Coumadin may increase your risk of developing a new clot or stroke.  Get refills before you run out.  What do you do if you miss a dose? If you miss a dose, take it as soon as you remember on the same day then continue your regularly scheduled regimen the next day.  Do not take two doses of Coumadin at the same time.  Important Safety Information A possible side effect of Coumadin (Warfarin) is an increased risk of bleeding. You should call your healthcare provider right away if you experience any of the following: ? Bleeding from an injury or your nose that does not  stop. ? Unusual colored urine (red or dark brown) or unusual colored stools (red or black). ? Unusual bruising for unknown reasons. ? A serious fall or if you hit your head (even if there is no bleeding).  Some foods or medicines interact with Coumadin (warfarin) and might alter your response to warfarin. To help avoid this: ? Eat a balanced diet, maintaining a consistent amount of Vitamin K. ? Notify your provider about major diet changes you plan to make. ? Avoid alcohol or limit your intake to 1 drink for women and 2 drinks for men per day. (1 drink is  5 oz. wine, 12 oz. beer, or 1.5 oz. liquor.)  Make sure that ANY health care provider who prescribes medication for you knows that you are taking Coumadin (warfarin).  Also make sure the healthcare provider who is monitoring your Coumadin knows when you have started a new medication including herbals and non-prescription products.  Coumadin (Warfarin)  Major Drug Interactions  Increased Warfarin Effect Decreased Warfarin Effect  Alcohol (large quantities) Antibiotics (esp. Septra/Bactrim, Flagyl, Cipro) Amiodarone (Cordarone) Aspirin (ASA) Cimetidine (Tagamet) Megestrol (Megace) NSAIDs (ibuprofen, naproxen, etc.) Piroxicam (Feldene) Propafenone (Rythmol SR) Propranolol (Inderal) Isoniazid (INH) Posaconazole (Noxafil) Barbiturates (Phenobarbital) Carbamazepine (Tegretol) Chlordiazepoxide (Librium) Cholestyramine (Questran) Griseofulvin Oral Contraceptives Rifampin Sucralfate (Carafate) Vitamin K   Coumadin (Warfarin) Major Herbal Interactions  Increased Warfarin Effect Decreased Warfarin Effect  Garlic Ginseng Ginkgo biloba Coenzyme Q10 Green tea St. Johns wort    Coumadin (Warfarin) FOOD Interactions  Eat a consistent number of servings per week of foods HIGH in Vitamin K (1 serving =  cup)  Collards (cooked, or boiled & drained) Kale (cooked, or boiled & drained) Mustard greens (cooked, or boiled & drained) Parsley *serving size only =  cup Spinach (cooked, or boiled & drained) Swiss chard (cooked, or boiled & drained) Turnip greens (cooked, or boiled & drained)  Eat a consistent number of servings per week of foods MEDIUM-HIGH in Vitamin K (1 serving = 1 cup)  Asparagus (cooked, or boiled & drained) Broccoli (cooked, boiled & drained, or raw & chopped) Brussel sprouts (cooked, or boiled & drained) *serving size only =  cup Lettuce, raw (green leaf, endive, romaine) Spinach, raw Turnip greens, raw & chopped   These websites have more information on  Coumadin (warfarin):  http://www.king-russell.com/www.coumadin.com; https://www.hines.net/www.ahrq.gov/consumer/coumadin.htm;

## 2019-03-09 NOTE — Progress Notes (Addendum)
T[facility] DAILY ICU PROGRESS NOTE                   [address]            [place] [address]          [phone]   2 Days Post-Op Procedure(s) (LRB): MINIMALLY INVASIVE MITRAL VALVE REPAIR (MVR) USING MEMO 4D SIZE 34 (Right) MINIMALLY INVASIVE MAZE PROCEDURE (N/A) TRANSESOPHAGEAL ECHOCARDIOGRAM (TEE) (N/A)  Total Length of Stay:  LOS: 2 days   Subjective: Awake and alert, up in chair.  Had some nausea yesterday, no abd pain, no BM yet but passing gas.  Making progress with mobility.   Objective: Vital signs in last 24 hours: Temp:  [97.9 F (36.6 C)-98.9 F (37.2 C)] 97.9 F (36.6 C) (07/17 0805) Pulse Rate:  [69-81] 70 (07/17 0700) Cardiac Rhythm: Normal sinus rhythm (07/17 0400) Resp:  [9-21] 18 (07/17 0700) BP: (84-117)/(53-78) 98/73 (07/17 0700) SpO2:  [90 %-99 %] 90 % (07/17 0700) Arterial Line BP: (107-124)/(40-55) 111/51 (07/16 1500) Weight:  [87.6 kg] 87.6 kg (07/17 0500)  Filed Weights   03/07/19 0701 03/08/19 0500 03/09/19 0500  Weight: 83.9 kg 88.2 kg 87.6 kg    Weight change: 3.685 kg   Hemodynamic parameters for last 24 hours: PAP: (28)/(13) 28/13  Intake/Output from previous day: 07/16 0701 - 07/17 0700 In: 2057.7 [P.O.:1360; I.V.:497.6; IV Piggyback:200.1] Out: 2030 [Urine:1420; Chest Tube:610]  Intake/Output this shift: No intake/output data recorded.  Current Meds: Scheduled Meds: . acetaminophen  1,000 mg Oral Q6H  . aspirin EC  81 mg Oral Daily  . bisacodyl  10 mg Oral Daily   Or  . bisacodyl  10 mg Rectal Daily  . Chlorhexidine Gluconate Cloth  6 each Topical Daily  . docusate sodium  200 mg Oral Daily  . enoxaparin (LOVENOX) injection  30 mg Subcutaneous QHS  . finasteride  5 mg Oral QHS  . furosemide  20 mg Intravenous Q6H  . [START ON 03/10/2019] furosemide  40 mg Oral Daily  . ketorolac  15 mg Intravenous Q6H  . mouth rinse  15 mL Mouth Rinse BID  . montelukast  10 mg Oral Daily  . pantoprazole  40 mg Oral Daily   . simvastatin  40 mg Oral QHS  . sodium chloride flush  3 mL Intravenous Q12H  . warfarin  5 mg Oral q1800  . Warfarin - Physician Dosing Inpatient   Does not apply q1800   Continuous Infusions: . sodium chloride    . cefUROXime (ZINACEF)  IV Stopped (03/08/19 1912)  . lactated ringers     PRN Meds:.metoprolol tartrate, morphine injection, ondansetron (ZOFRAN) IV, oxyCODONE, sodium chloride flush, traMADol  General appearance: alert, cooperative and no distress Neurologic: intact Heart: Currently A-paced with appropriate capture. Intrinsic rhythm is slow junctional.  Lungs: Breath sounds clear, CT drainage 371ml past 12 hours. Abdomen: soft, non-tender,  Extremities: no edema, right femoral vascular access sites dry with no evidence of hematoma. Palpable distal pulses. Wound: Right chest incision covered with a dry dressing.  Lab Results: CBC: Recent Labs    03/08/19 1735 03/09/19 0420  WBC 16.6* 14.7*  HGB 10.5* 10.1*  HCT 32.6* 31.2*  PLT 68* 64*   BMET:  Recent Labs    03/08/19 1735 03/09/19 0420  NA 133* 135  K 4.1 4.5  CL 103 104  CO2 22 26  GLUCOSE 208* 139*  BUN 19 19  CREATININE 0.99 1.03  CALCIUM 7.7* 7.9*  CMET: Lab Results  Component Value Date   WBC 14.7 (H) 03/09/2019   HGB 10.1 (L) 03/09/2019   HCT 31.2 (L) 03/09/2019   PLT 64 (L) 03/09/2019   GLUCOSE 139 (H) 03/09/2019   ALT 10 03/05/2019   AST 20 03/05/2019   NA 135 03/09/2019   K 4.5 03/09/2019   CL 104 03/09/2019   CREATININE 1.03 03/09/2019   BUN 19 03/09/2019   CO2 26 03/09/2019   INR 1.4 (H) 03/09/2019   HGBA1C 6.0 (H) 03/05/2019      PT/INR:  Recent Labs    03/09/19 0420  LABPROT 16.7*  INR 1.4*   Radiology: No results found.   Assessment/Plan: S/P Procedure(s) (LRB): MINIMALLY INVASIVE MITRAL VALVE REPAIR (MVR) USING MEMO 4D SIZE 34 (Right) MINIMALLY INVASIVE MAZE PROCEDURE (N/A) TRANSESOPHAGEAL ECHOCARDIOGRAM (TEE) (N/A)  -POD2 mini MV repair for MR with  MVP and MAZE for persistent atrial fibrillation. Hemodynamics stable since surgery.  Leave chest tubes for drainage another day. Continue anticoagulation. Transfer to progressive care later today and continue working on mobility.  -History of atrial fibrillation- s/p MAZE and left atrial appendage clip. Currently  A-paced at 5970 with good capture. Avoid B-blocker for now.   -Volume excess- Wt sill up 3-4kg from pre-op. Continue Lasix 20mg  IVq6h today, change to oral Lasix tomorrow.  -Endo- HbgA1C 6.0, continue SSI.   -Thrombocytopenia- slight drop again today but no indication for transfusion, monitor daily.   -History of BPH- Proscar resumed, d/c foley catheter today.  -History of asthma-no wheezing or respiratory distress. Maintenance therapy resumed.  -DVT PPX-continue SCD's for now. wopuld hold enoxaparin until Plt count above 90,000.  -Dyslipidemia-statin resumed.  -Nausea, abd exam benign. Add Reglan q6h x 24 hours.    Leary RocaMyron G. Roddenberry, PA-C [phone] 03/09/2019 8:07 AM  I have seen and examined the patient and agree with the assessment and plan as outlined.  Doing well POD2.  Junctional rhythm under pacer, no P waves seen.  AAI pacing @ 70/min.  Mobilize.  Diuresis.  Transfer 4E.  Leave chest tubes until drainage decreases further.  Coumadin.  Purcell Nailslarence H , MD 03/09/2019 9:43 AM

## 2019-03-10 ENCOUNTER — Inpatient Hospital Stay (HOSPITAL_COMMUNITY): Payer: Medicare Other

## 2019-03-10 LAB — CBC
HCT: 31.4 % — ABNORMAL LOW (ref 39.0–52.0)
Hemoglobin: 10.2 g/dL — ABNORMAL LOW (ref 13.0–17.0)
MCH: 29.8 pg (ref 26.0–34.0)
MCHC: 32.5 g/dL (ref 30.0–36.0)
MCV: 91.8 fL (ref 80.0–100.0)
Platelets: 63 10*3/uL — ABNORMAL LOW (ref 150–400)
RBC: 3.42 MIL/uL — ABNORMAL LOW (ref 4.22–5.81)
RDW: 12.6 % (ref 11.5–15.5)
WBC: 13.1 10*3/uL — ABNORMAL HIGH (ref 4.0–10.5)
nRBC: 0 % (ref 0.0–0.2)

## 2019-03-10 LAB — BASIC METABOLIC PANEL
Anion gap: 8 (ref 5–15)
BUN: 18 mg/dL (ref 8–23)
CO2: 25 mmol/L (ref 22–32)
Calcium: 8.1 mg/dL — ABNORMAL LOW (ref 8.9–10.3)
Chloride: 103 mmol/L (ref 98–111)
Creatinine, Ser: 0.89 mg/dL (ref 0.61–1.24)
GFR calc Af Amer: >60 mL/min (ref >60)
GFR calc non Af Amer: >60 mL/min (ref >60)
Glucose, Bld: 127 mg/dL — ABNORMAL HIGH (ref 70–99)
Potassium: 4 mmol/L (ref 3.5–5.1)
Sodium: 136 mmol/L (ref 135–145)

## 2019-03-10 LAB — PROTIME-INR
INR: 1.3 — ABNORMAL HIGH (ref 0.8–1.2)
Prothrombin Time: 16.3 seconds — ABNORMAL HIGH (ref 11.4–15.2)

## 2019-03-10 IMAGING — DX PORTABLE CHEST - 1 VIEW
1 series · 1 of 1 positions shown · non-contrast
Comparison: Chest x-ray dated [DATE].

CLINICAL DATA: RIGHT-sided chest tube in place. RIGHT-sided chest
pain.

EXAM:
PORTABLE CHEST 1 VIEW

[chest ap]
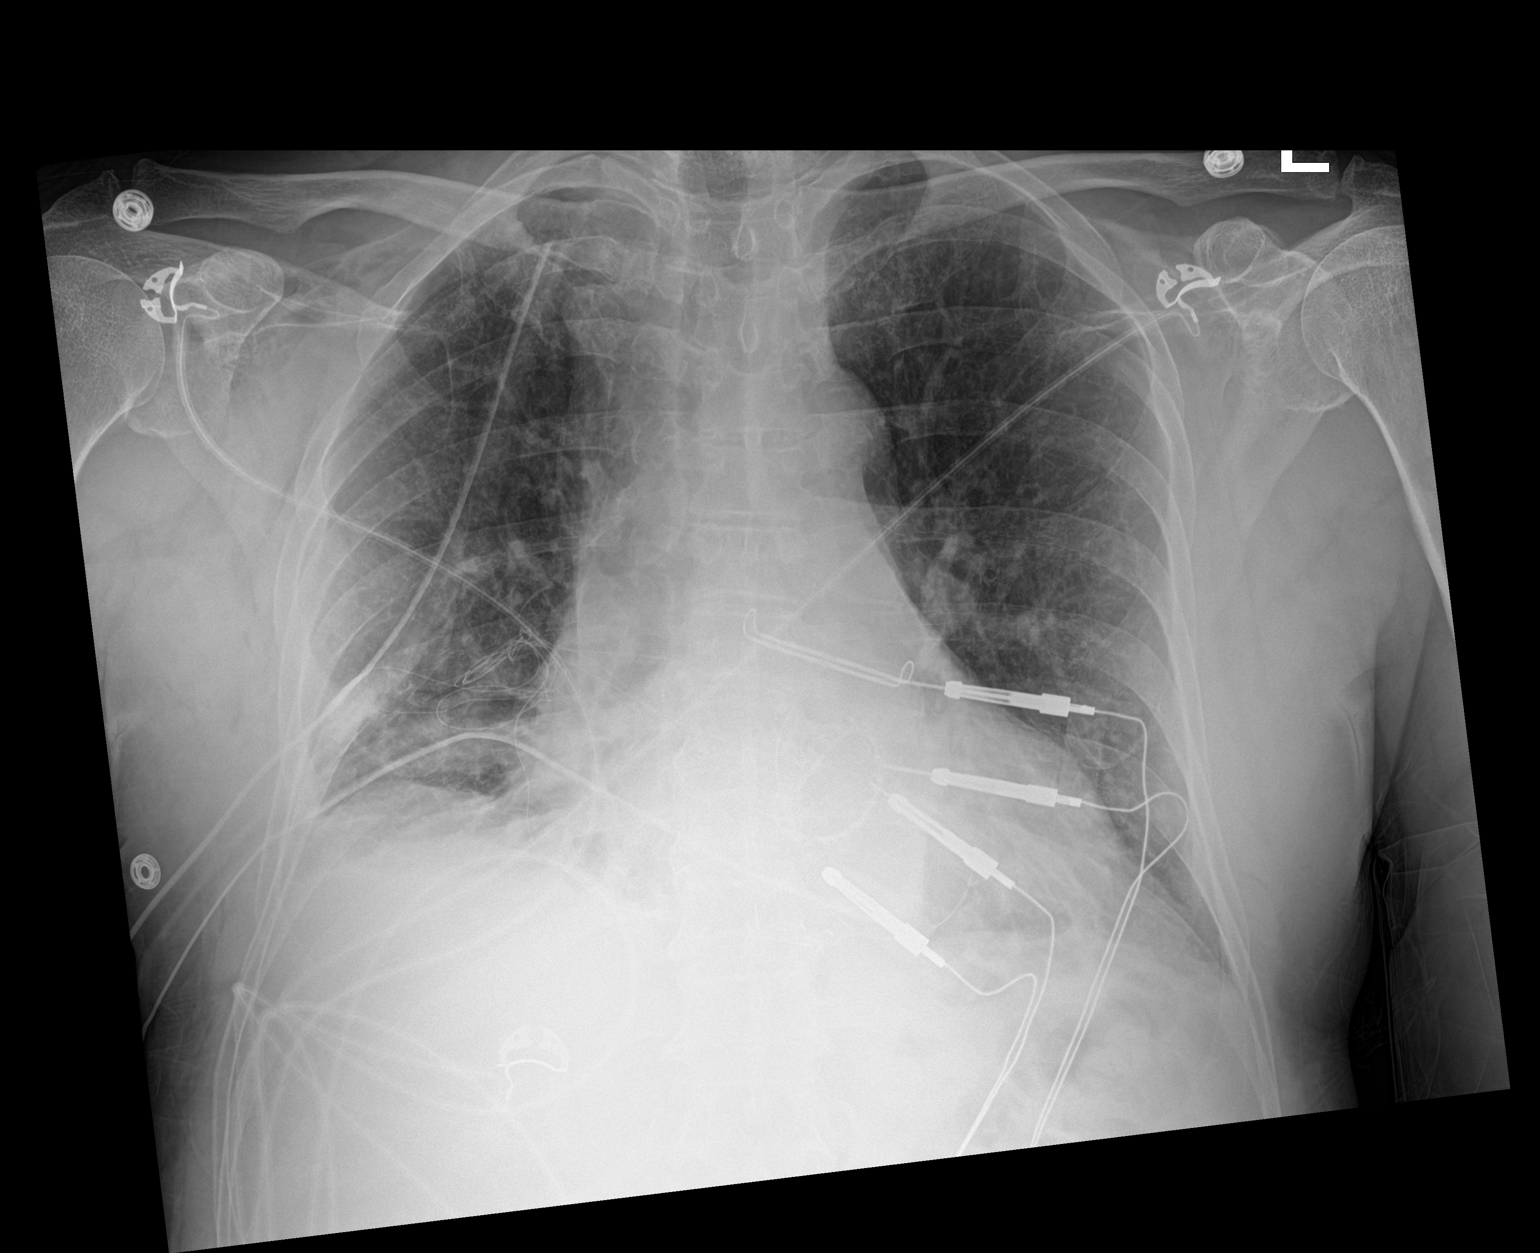

[1 of 1 positions shown; findings below may reference images not displayed]

FINDINGS: RIGHT-sided chest tube is stable in position with tip directed
towards the RIGHT lung apex. Probable associated atelectasis and/or
small pleural effusion at the RIGHT lung base. LEFT lung is clear.
No pneumothorax is seen on today's exam. Heart size and mediastinal
contours are stable.
IMPRESSION: Stable chest tube position. No pneumothorax seen on today's exam.

## 2019-03-10 NOTE — Progress Notes (Signed)
     [address]       [place] [address]             [phone]       No events.  Vitals:   03/10/19 0700 03/10/19 0800  BP:  99/61  Pulse:  69  Resp:  (!) 24  Temp: 97.8 F (36.6 C)   SpO2:  93%   I/O last 3 completed shifts: In: 2029.6 [P.O.:1660; I.V.:227; IV Piggyback:142.6] Out: 3895 [Urine:2915; Chest Tube:980]  Alert NAD Paced at 70.  Junctional in the 50s underneath EWOB, dressings clean SS CT output  CBC Latest Ref Rng & Units 03/10/2019 03/09/2019 03/08/2019  WBC 4.0 - 10.5 K/uL 13.1(H) 14.7(H) 16.6(H)  Hemoglobin 13.0 - 17.0 g/dL 10.2(L) 10.1(L) 10.5(L)  Hematocrit 39.0 - 52.0 % 31.4(L) 31.2(L) 32.6(L)  Platelets 150 - 400 K/uL 63(L) 64(L) 68(L)   BMP Latest Ref Rng & Units 03/10/2019 03/09/2019 03/08/2019  Glucose 70 - 99 mg/dL 127(H) 139(H) 208(H)  BUN 8 - 23 mg/dL 18 19 19   Creatinine 0.61 - 1.24 mg/dL 0.89 1.03 0.99  BUN/Creat Ratio 10 - 24 - - -  Sodium 135 - 145 mmol/L 136 135 133(L)  Potassium 3.5 - 5.1 mmol/L 4.0 4.5 4.1  Chloride 98 - 111 mmol/L 103 104 103  CO2 22 - 32 mmol/L 25 26 22   Calcium 8.9 - 10.3 mg/dL 8.1(L) 7.9(L) 7.7(L)     POD3 s/p min MVR maze Doing well  - Neuro: pain controlled.  Stable - CV: will continue pacing for now.  No BB. On coumadin, and statin - Pulm: SS CT output.  Will keep drains.  Continue pulm toilet - Renal: good uop.  Diuresing with lasix.  Creat stable - Heme: on coumadin, INR  1.3.  Will continue to follow - GI: on diet - Dispo: to floor.

## 2019-03-11 LAB — PROTIME-INR
INR: 1.8 — ABNORMAL HIGH (ref 0.8–1.2)
Prothrombin Time: 20.3 seconds — ABNORMAL HIGH (ref 11.4–15.2)

## 2019-03-11 MED ORDER — METFORMIN HCL 500 MG PO TABS
500.0000 mg | ORAL_TABLET | Freq: Every day | ORAL | Status: DC
  Administered 2019-03-11 – 2019-03-13 (×3): 500 mg via ORAL
  Filled 2019-03-11 (×3): qty 1

## 2019-03-11 MED ORDER — MAGNESIUM HYDROXIDE 400 MG/5ML PO SUSP: 30.0000 mL | Freq: Every day | ORAL | Status: DC | PRN

## 2019-03-11 NOTE — Plan of Care (Signed)
Will continue to monitor.

## 2019-03-11 NOTE — Progress Notes (Addendum)
[address]       [place] [address]             [phone]      4 Days Post-Op Procedure(s) (LRB): MINIMALLY INVASIVE MITRAL VALVE REPAIR (MVR) USING MEMO 4D SIZE 34 (Right) MINIMALLY INVASIVE MAZE PROCEDURE (N/A) TRANSESOPHAGEAL ECHOCARDIOGRAM (TEE) (N/A) Subjective: Feels ok, some incisional pain  Objective: Vital signs in last 24 hours: Temp:  [97.6 F (36.4 C)-98.5 F (36.9 C)] 98.5 F (36.9 C) (07/19 0424) Pulse Rate:  [69-80] 79 (07/19 0424) Cardiac Rhythm: Normal sinus rhythm;Atrial paced (07/18 1905) Resp:  [12-24] 12 (07/19 0424) BP: (99-125)/(59-79) 114/64 (07/19 0424) SpO2:  [91 %-97 %] 91 % (07/19 0424) Weight:  [88.4 kg] 88.4 kg (07/19 0424)  Hemodynamic parameters for last 24 hours:    Intake/Output from previous day: 07/18 0701 - 07/19 0700 In: 760 [P.O.:760] Out: 1870 [Urine:1600; Chest Tube:270] Intake/Output this shift: No intake/output data recorded.  General appearance: alert, cooperative and no distress Heart: regular rate and rhythm and occ extrasystole Lungs: mildly dim in bases Abdomen: soft, nontender Extremities: min edema Wound: incis healing well  Lab Results: Recent Labs    03/09/19 0420 03/10/19 0429  WBC 14.7* 13.1*  HGB 10.1* 10.2*  HCT 31.2* 31.4*  PLT 64* 63*   BMET:  Recent Labs    03/09/19 0420 03/10/19 0616  NA 135 136  K 4.5 4.0  CL 104 103  CO2 26 25  GLUCOSE 139* 127*  BUN 19 18  CREATININE 1.03 0.89  CALCIUM 7.9* 8.1*    PT/INR:  Recent Labs    03/10/19 0429  LABPROT 16.3*  INR 1.3*   ABG    Component Value Date/Time   PHART 7.304 (L) 03/07/2019 1804   HCO3 22.7 03/07/2019 1804   TCO2 24 03/07/2019 1804   ACIDBASEDEF 4.0 (H) 03/07/2019 1804   O2SAT 96.0 03/07/2019 1804   CBG (last 3)  Recent Labs    03/09/19 1131 03/09/19 1549 03/09/19 2005  GLUCAP 122* 139* 161*    Meds Scheduled Meds: . acetaminophen  1,000 mg Oral Q6H  . aspirin EC  81 mg Oral Daily   . bisacodyl  10 mg Oral Daily   Or  . bisacodyl  10 mg Rectal Daily  . Chlorhexidine Gluconate Cloth  6 each Topical Daily  . docusate sodium  200 mg Oral Daily  . finasteride  5 mg Oral QHS  . furosemide  40 mg Oral Daily  . mouth rinse  15 mL Mouth Rinse BID  . montelukast  10 mg Oral Daily  . pantoprazole  40 mg Oral Daily  . simvastatin  40 mg Oral QHS  . sodium chloride flush  3 mL Intravenous Q12H  . warfarin  5 mg Oral q1800  . Warfarin - Physician Dosing Inpatient   Does not apply q1800   Continuous Infusions: . sodium chloride    . sodium chloride Stopped (03/09/19 1046)  . lactated ringers     PRN Meds:.sodium chloride, morphine injection, ondansetron (ZOFRAN) IV, oxyCODONE, sodium chloride flush, traMADol  Xrays Dg Chest Port 1 View  Result Date: 03/10/2019 CLINICAL DATA:  RIGHT-sided chest tube in place. RIGHT-sided chest pain. EXAM: PORTABLE CHEST 1 VIEW COMPARISON:  Chest x-ray dated 03/09/2019. FINDINGS: RIGHT-sided chest tube is stable in position with tip directed towards the RIGHT lung apex. Probable associated atelectasis and/or small pleural effusion at the RIGHT lung base. LEFT lung is clear. No pneumothorax is seen  on today's exam. Heart size and mediastinal contours are stable. IMPRESSION: Stable chest tube position. No pneumothorax seen on today's exam. Electronically Signed   By: Bary RichardStan  Maynard M.D.   On: 03/10/2019 08:25    Assessment/Plan: S/P Procedure(s) (LRB): MINIMALLY INVASIVE MITRAL VALVE REPAIR (MVR) USING MEMO 4D SIZE 34 (Right) MINIMALLY INVASIVE MAZE PROCEDURE (N/A) TRANSESOPHAGEAL ECHOCARDIOGRAM (TEE) (N/A)  1 doing well POD#4 2 conts to be be a-paced- junct underneath with HR 60- continue for now with close observation. No beta blocker or neg chronotropics. BP is adequate  3 some volume overload- cont to diurese 4 CT- 270 cc yesterday, will leave CT's another day 5 INR pending 6 no new labs or CXR -will order for am 7 BS control pretty  good , will resume home dosing of metformin 8  routine pulm toilet and cardiac rehab  LOS: 4 days    Rowe ClackWayne E Gold [facility]-C 03/11/2019 Pager [phone]  Doing well Junctional in high 50s-60. SS CT output  Will keep drains Continue PT/OT, pulm toilet  Adithya Difrancesco O Sophee Mckimmy

## 2019-03-12 LAB — PROTIME-INR
INR: 2.2 — ABNORMAL HIGH (ref 0.8–1.2)
Prothrombin Time: 23.9 seconds — ABNORMAL HIGH (ref 11.4–15.2)

## 2019-03-12 LAB — CBC
HCT: 29.6 % — ABNORMAL LOW (ref 39.0–52.0)
Hemoglobin: 9.8 g/dL — ABNORMAL LOW (ref 13.0–17.0)
MCH: 30 pg (ref 26.0–34.0)
MCHC: 33.1 g/dL (ref 30.0–36.0)
MCV: 90.5 fL (ref 80.0–100.0)
Platelets: 114 10*3/uL — ABNORMAL LOW (ref 150–400)
RBC: 3.27 MIL/uL — ABNORMAL LOW (ref 4.22–5.81)
RDW: 12.5 % (ref 11.5–15.5)
WBC: 8.7 10*3/uL (ref 4.0–10.5)
nRBC: 0 % (ref 0.0–0.2)

## 2019-03-12 LAB — BASIC METABOLIC PANEL
Anion gap: 9 (ref 5–15)
BUN: 19 mg/dL (ref 8–23)
CO2: 24 mmol/L (ref 22–32)
Calcium: 8.4 mg/dL — ABNORMAL LOW (ref 8.9–10.3)
Chloride: 103 mmol/L (ref 98–111)
Creatinine, Ser: 0.7 mg/dL (ref 0.61–1.24)
GFR calc Af Amer: >60 mL/min (ref >60)
GFR calc non Af Amer: >60 mL/min (ref >60)
Glucose, Bld: 118 mg/dL — ABNORMAL HIGH (ref 70–99)
Potassium: 3.6 mmol/L (ref 3.5–5.1)
Sodium: 136 mmol/L (ref 135–145)

## 2019-03-12 MED ORDER — POTASSIUM CHLORIDE CRYS ER 20 MEQ PO TBCR
40.0000 meq | EXTENDED_RELEASE_TABLET | Freq: Every day | ORAL | Status: DC
  Administered 2019-03-13: 40 meq via ORAL
  Filled 2019-03-12: qty 2

## 2019-03-12 MED ORDER — WARFARIN SODIUM 2 MG PO TABS
2.0000 mg | ORAL_TABLET | Freq: Every day | ORAL | Status: DC
  Administered 2019-03-12: 2 mg via ORAL
  Filled 2019-03-12: qty 1

## 2019-03-12 MED ORDER — POTASSIUM CHLORIDE CRYS ER 20 MEQ PO TBCR
40.0000 meq | EXTENDED_RELEASE_TABLET | Freq: Once | ORAL | Status: AC
  Administered 2019-03-12: 40 meq via ORAL
  Filled 2019-03-12: qty 2

## 2019-03-12 NOTE — Progress Notes (Addendum)
5 Days Post-Op Procedure(s) (LRB): MINIMALLY INVASIVE MITRAL VALVE REPAIR (MVR) USING MEMO 4D SIZE 34 (Right) MINIMALLY INVASIVE MAZE PROCEDURE (N/A) TRANSESOPHAGEAL ECHOCARDIOGRAM (TEE) (N/A) Subjective: Awake and alert, pain control adequate.  Making progress with mobility.   Objective: Vital signs in last 24 hours: Temp:  [98.1 F (36.7 C)-98.4 F (36.9 C)] 98.2 F (36.8 C) (07/20 0807) Pulse Rate:  [66-81] 79 (07/20 0807) Cardiac Rhythm: Atrial paced (07/20 0727) Resp:  [14-22] 19 (07/20 0807) BP: (116-127)/(55-87) 116/71 (07/20 0807) SpO2:  [93 %-100 %] 97 % (07/20 0807) Weight:  [87.3 kg] 87.3 kg (07/20 0433)   Intake/Output from previous day: 07/19 0701 - 07/20 0700 In: 350 [P.O.:350] Out: 360 [Urine:300; Chest Tube:60] Intake/Output this shift: Total I/O In: 240 [P.O.:240] Out: 225 [Urine:225]  General appearance: alert, cooperative and no distress Neurologic: intact Heart: Pacer turned off, in SR with frequent premature complexes.  Lungs: clear to auscultation bilaterally Abdomen: Soft and non-tender. Extremities: No peripheral edema, all are well perfused. Wound: rigth chest incision is intact and dry.  Lab Results: Recent Labs    03/10/19 0429 03/12/19 0308  WBC 13.1* 8.7  HGB 10.2* 9.8*  HCT 31.4* 29.6*  PLT 63* 114*   BMET:  Recent Labs    03/10/19 0616 03/12/19 0308  NA 136 136  K 4.0 3.6  CL 103 103  CO2 25 24  GLUCOSE 127* 118*  BUN 18 19  CREATININE 0.89 0.70  CALCIUM 8.1* 8.4*    PT/INR:  Recent Labs    03/12/19 0308  LABPROT 23.9*  INR 2.2*   ABG    Component Value Date/Time   PHART 7.304 (L) 03/07/2019 1804   HCO3 22.7 03/07/2019 1804   TCO2 24 03/07/2019 1804   ACIDBASEDEF 4.0 (H) 03/07/2019 1804   O2SAT 96.0 03/07/2019 1804   CBG (last 3)  Recent Labs    03/09/19 1131 03/09/19 1549 03/09/19 2005  GLUCAP 122* 139* 161*    Assessment/Plan: S/P Procedure(s) (LRB): MINIMALLY INVASIVE MITRAL VALVE REPAIR (MVR)  USING MEMO 4D SIZE 34 (Right) MINIMALLY INVASIVE MAZE PROCEDURE (N/A) TRANSESOPHAGEAL ECHOCARDIOGRAM (TEE) (N/A)  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 Hemodynamics stable since surgery. Remove chest tubes and On-Q catheter today. PA/Lat CXR in AM.   Continue anticoagulation. INR 2.2. Decrease Coumadin dose to 2mg  tonight for possible PW removal tomorrow.   -History of atrial fibrillation- s/p MAZE and left atrial appendage clip. Paced since surgery. Currently in SR and pacer is off.   -Volume excess- improved, near pre-op wt. Continue Lasix 40mg  po daily.  -Endo- HbgA1C 6.0, continue SSI.   -Expected acute blood loss anemia- Hct stable, monitor.   -Thrombocytopenia- improved, Plt count recovering.   -History of BPH- Proscar resumed  -History of asthma-no wheezing or respiratory distress. Maintenance therapy resumed.  -DVT PPX--has therapeutic INR on Coumadin.   -Dyslipidemia-statin resumed.   LOS: 5 days    Antony Odea, PA-C [phone] 03/12/2019   I have seen and examined the patient and agree with the assessment and plan as outlined.  Doing very well.  Maintaining NSR.  Weight trending down towards baseline.  D/C pacing wires and chest tubes.  Possible D/C home 1-2 days.  [name], MD 03/12/2019 11:02 AM

## 2019-03-12 NOTE — Progress Notes (Signed)
CT's removed per order. EPW removed per order. Pt tolerated well. Pt educated on 1 hour bedrest. Call light in reach. Will continue to monitor.  Clyde Canterbury, RN

## 2019-03-12 NOTE — Progress Notes (Signed)
VAST consulted for IV access.  Called unit and spoke with pt's nurse, [place]. Educated about vein preservation and decreased risk of infection and recommendation to not place "just in case" IV's. Pt currently has no IV fluids or meds scheduled.  Contacted treatment team via [facility] chat to advise of recommendation to leave pt without IV access at this time.

## 2019-03-12 NOTE — Progress Notes (Signed)
CARDIAC REHAB PHASE I   PRE:  Rate/Rhythm: 86 SR PVCs  BP:  Supine:   Sitting: 126/73  Standing:    SaO2: 96%RA  MODE:  Ambulation: 600 ft   POST:  Rate/Rhythm: 96 SR PVCs  BP:  Supine: 130/76  Sitting:   Standing:    SaO2: 95%RA 0910-1005 Pt assisted from bathroom and to recliner. Pt walked 600 ft on RA with asst x 1 to manage equipment. Pt tolerated well. Does not need safety device for walking. To bed after walk for CT removal. Pt to walk a couple more times today.   Graylon Good, RN BSN  03/12/2019 9:55 AM

## 2019-03-12 NOTE — Care Management Important Message (Signed)
Important Message  Patient Details  Name: Alejandro Burnett MRN: [id number] Date of Birth: 1951-02-28   Medicare Important Message Given:  Yes     Shelda Altes 03/12/2019, 1:53 PM

## 2019-03-12 NOTE — Progress Notes (Signed)
Antony Odea, PA-C returned secure chat and requested IV access be maintained at this time.  VAST RN contacted unit nurse and advised someone will be there as soon as possible to place "just in case" IV.

## 2019-03-13 ENCOUNTER — Telehealth: Payer: Self-pay | Admitting: Cardiology

## 2019-03-13 ENCOUNTER — Inpatient Hospital Stay (HOSPITAL_COMMUNITY): Payer: Medicare Other

## 2019-03-13 LAB — BASIC METABOLIC PANEL
Anion gap: 7 (ref 5–15)
BUN: 18 mg/dL (ref 8–23)
CO2: 24 mmol/L (ref 22–32)
Calcium: 8.6 mg/dL — ABNORMAL LOW (ref 8.9–10.3)
Chloride: 105 mmol/L (ref 98–111)
Creatinine, Ser: 0.84 mg/dL (ref 0.61–1.24)
GFR calc Af Amer: >60 mL/min (ref >60)
GFR calc non Af Amer: >60 mL/min (ref >60)
Glucose, Bld: 114 mg/dL — ABNORMAL HIGH (ref 70–99)
Potassium: 3.8 mmol/L (ref 3.5–5.1)
Sodium: 136 mmol/L (ref 135–145)

## 2019-03-13 LAB — CBC
HCT: 30.2 % — ABNORMAL LOW (ref 39.0–52.0)
Hemoglobin: 10.1 g/dL — ABNORMAL LOW (ref 13.0–17.0)
MCH: 29.7 pg (ref 26.0–34.0)
MCHC: 33.4 g/dL (ref 30.0–36.0)
MCV: 88.8 fL (ref 80.0–100.0)
Platelets: 127 10*3/uL — ABNORMAL LOW (ref 150–400)
RBC: 3.4 MIL/uL — ABNORMAL LOW (ref 4.22–5.81)
RDW: 12.6 % (ref 11.5–15.5)
WBC: 9.6 10*3/uL (ref 4.0–10.5)
nRBC: 0 % (ref 0.0–0.2)

## 2019-03-13 LAB — PROTIME-INR
INR: 2 — ABNORMAL HIGH (ref 0.8–1.2)
Prothrombin Time: 22.6 seconds — ABNORMAL HIGH (ref 11.4–15.2)

## 2019-03-13 MED ORDER — TRAMADOL HCL 50 MG PO TABS: 50.0000 mg | ORAL_TABLET | ORAL | 0 refills | Status: AC | PRN

## 2019-03-13 MED ORDER — METOPROLOL TARTRATE 12.5 MG HALF TABLET
12.5000 mg | ORAL_TABLET | Freq: Two times a day (BID) | ORAL | Status: DC
  Administered 2019-03-13: 10:00:00 12.5 mg via ORAL
  Filled 2019-03-13: qty 1

## 2019-03-13 MED ORDER — WARFARIN SODIUM 2.5 MG PO TABS: 5.0000 mg | ORAL_TABLET | Freq: Every day | ORAL | 2 refills | Status: DC

## 2019-03-13 NOTE — Progress Notes (Signed)
D/C instructions given to patient including medications and wound care. All questions answered. IV removed, clean and intact. Friend to escort pt home.  Clyde Canterbury, RN

## 2019-03-13 NOTE — Telephone Encounter (Signed)
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 Per Dr. Johnnye Sima, he is not regulatedyet.Marland Kitchen Please call patient..They will have drawn at the PT clinic at there office this week for the Friday check and then if we could schedule follow up in ours after that.Marland Kitchen

## 2019-03-13 NOTE — Progress Notes (Signed)
6 Days Post-Op Procedure(s) (LRB): MINIMALLY INVASIVE MITRAL VALVE REPAIR (MVR) USING MEMO 4D SIZE 34 (Right) MINIMALLY INVASIVE MAZE PROCEDURE (N/A) TRANSESOPHAGEAL ECHOCARDIOGRAM (TEE) (N/A) Subjective: No new problems, doing well with mobility. Feels like he is ready to return to home.   Objective: Vital signs in last 24 hours: Temp:  [98 F (36.7 C)-98.6 F (37 C)] 98.2 F (36.8 C) (07/21 0807) Pulse Rate:  [78-81] 79 (07/21 0629) Cardiac Rhythm: Normal sinus rhythm (07/21 0305) Resp:  [15-26] 22 (07/21 0807) BP: (98-129)/(64-74) 115/66 (07/21 0807) SpO2:  [94 %-97 %] 96 % (07/21 0807) Weight:  [82.6 kg] 82.6 kg (07/21 0500)     Intake/Output from previous day: 07/20 0701 - 07/21 0700 In: 720 [P.O.:720] Out: 425 [Urine:425] Intake/Output this shift: No intake/output data recorded.  General appearance: alert, cooperative and no distress Neurologic: intact Heart:  in SR with frequent PVC's Lungs: clear to auscultation bilaterally Abdomen: Soft and non-tender. Extremities: No peripheral edema, all are well perfused. Wound: rigth chest incision is intact and dry.  Lab Results: Recent Labs    03/12/19 0308 03/13/19 0331  WBC 8.7 9.6  HGB 9.8* 10.1*  HCT 29.6* 30.2*  PLT 114* 127*   BMET:  Recent Labs    03/12/19 0308 03/13/19 0331  NA 136 136  K 3.6 3.8  CL 103 105  CO2 24 24  GLUCOSE 118* 114*  BUN 19 18  CREATININE 0.70 0.84  CALCIUM 8.4* 8.6*    PT/INR:  Recent Labs    03/13/19 0331  LABPROT 22.6*  INR 2.0*   ABG    Component Value Date/Time   PHART 7.304 (L) 03/07/2019 1804   HCO3 22.7 03/07/2019 1804   TCO2 24 03/07/2019 1804   ACIDBASEDEF 4.0 (H) 03/07/2019 1804   O2SAT 96.0 03/07/2019 1804   CBG (last 3)  No results for input(s): GLUCAP in the last 72 hours.  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 Independent with mobility and tolerating diet. Wants to go home   -History of atrial fibrillation- s/p MAZE and left atrial appendage clip. Remains in SR with frequent PVC's, HR 80-90/min  -Volume excess- improved, near pre-op wt.   -Endo- HbgA1C 6.0,continueSSI.   -Expected acute blood loss anemia- Hct stable, monitor.   -Thrombocytopenia-improved, Plt count recovering.   -History of BPH- Proscar resumed, voiding ok.  -Plan discharge later today.    LOS: 6 days    Antony Odea, [place] [phone] 03/13/2019

## 2019-03-13 NOTE — Progress Notes (Signed)
[id number] Education completed with pt who voiced understanding. Reviewed restrictions, IS, heart healthy food choices and watching sodium, ex ed ,CRP 2. Pt not interested in CRP 2 referral. Offered to put on Coumadin video. Pt has booklet and declined video. Graylon Good RN BSN 03/13/2019 11:29 AM

## 2019-03-14 ENCOUNTER — Telehealth: Payer: Self-pay

## 2019-03-14 NOTE — Telephone Encounter (Signed)

## 2019-03-16 ENCOUNTER — Ambulatory Visit (INDEPENDENT_AMBULATORY_CARE_PROVIDER_SITE_OTHER): Payer: Medicare Other | Admitting: *Deleted

## 2019-03-16 ENCOUNTER — Other Ambulatory Visit: Payer: Self-pay

## 2019-03-16 DIAGNOSIS — I4891 Unspecified atrial fibrillation: Secondary | ICD-10-CM

## 2019-03-16 DIAGNOSIS — I824Y9 Acute embolism and thrombosis of unspecified deep veins of unspecified proximal lower extremity: Secondary | ICD-10-CM | POA: Insufficient documentation

## 2019-03-16 DIAGNOSIS — Z9889 Other specified postprocedural states: Secondary | ICD-10-CM | POA: Diagnosis not present

## 2019-03-16 DIAGNOSIS — Z8679 Personal history of other diseases of the circulatory system: Secondary | ICD-10-CM

## 2019-03-16 DIAGNOSIS — Z7901 Long term (current) use of anticoagulants: Secondary | ICD-10-CM

## 2019-03-16 DIAGNOSIS — Z5181 Encounter for therapeutic drug level monitoring: Secondary | ICD-10-CM | POA: Diagnosis not present

## 2019-03-16 HISTORY — DX: Unspecified atrial fibrillation: I48.91

## 2019-03-16 HISTORY — DX: Long term (current) use of anticoagulants: Z79.01

## 2019-03-16 HISTORY — DX: Acute embolism and thrombosis of unspecified deep veins of unspecified proximal lower extremity: I82.4Y9

## 2019-03-16 LAB — POCT INR: INR: 2.1 (ref 2.0–3.0)

## 2019-03-16 NOTE — Patient Instructions (Addendum)
A full discussion of the nature of anticoagulants has been carried out.  A benefit risk analysis has been presented to the patient, so that they understand the justification for choosing anticoagulation at this time. The need for frequent and regular monitoring, precise dosage adjustment and compliance is stressed.  Side effects of potential bleeding are discussed.  The patient should avoid any OTC items containing aspirin or ibuprofen, and should avoid great swings in general diet.  Avoid alcohol consumption.  Call if any signs of abnormal bleeding.   Description   Continue taking 2 tablets (5mg ) daily. Recheck INR in 1 week. Call with any new medications or procedures: Coumadin Clinic [phone] Main [phone]

## 2019-03-19 ENCOUNTER — Telehealth: Payer: Self-pay | Admitting: Cardiology

## 2019-03-21 ENCOUNTER — Telehealth: Payer: Self-pay | Admitting: Pharmacist

## 2019-03-21 NOTE — Telephone Encounter (Signed)

## 2019-03-23 ENCOUNTER — Other Ambulatory Visit: Payer: Self-pay

## 2019-03-23 ENCOUNTER — Ambulatory Visit (INDEPENDENT_AMBULATORY_CARE_PROVIDER_SITE_OTHER): Payer: Medicare Other | Admitting: *Deleted

## 2019-03-23 DIAGNOSIS — Z5181 Encounter for therapeutic drug level monitoring: Secondary | ICD-10-CM | POA: Diagnosis not present

## 2019-03-23 DIAGNOSIS — Z8679 Personal history of other diseases of the circulatory system: Secondary | ICD-10-CM

## 2019-03-23 DIAGNOSIS — Z9889 Other specified postprocedural states: Secondary | ICD-10-CM

## 2019-03-23 DIAGNOSIS — I4891 Unspecified atrial fibrillation: Secondary | ICD-10-CM

## 2019-03-23 LAB — POCT INR: INR: 3.1 — AB (ref 2.0–3.0)

## 2019-03-23 NOTE — Patient Instructions (Signed)
Description    Start taking 2 tablets (5mg ) daily except 1 tablet on Fridays. Recheck INR in 1 week. Call with any new medications or procedures: Coumadin Clinic [phone] Main [phone]

## 2019-03-28 ENCOUNTER — Ambulatory Visit: Payer: Medicare Other | Admitting: Cardiology

## 2019-04-02 ENCOUNTER — Other Ambulatory Visit: Payer: Self-pay

## 2019-04-02 ENCOUNTER — Other Ambulatory Visit: Payer: Self-pay | Admitting: *Deleted

## 2019-04-02 ENCOUNTER — Ambulatory Visit
Admission: RE | Admit: 2019-04-02 | Discharge: 2019-04-02 | Disposition: A | Discharge status: Home / Self Care | Payer: Medicare Other | Source: Ambulatory Visit | Attending: Thoracic Surgery (Cardiothoracic Vascular Surgery) | Admitting: Thoracic Surgery (Cardiothoracic Vascular Surgery)

## 2019-04-02 ENCOUNTER — Ambulatory Visit (INDEPENDENT_AMBULATORY_CARE_PROVIDER_SITE_OTHER): Payer: Self-pay | Admitting: Physician Assistant

## 2019-04-02 VITALS — BP 111/70 | HR 90 | Temp 97.5°F | Resp 20 | Ht 73.0 in | Wt 174.0 lb

## 2019-04-02 DIAGNOSIS — I340 Nonrheumatic mitral (valve) insufficiency: Secondary | ICD-10-CM

## 2019-04-02 DIAGNOSIS — Z9889 Other specified postprocedural states: Secondary | ICD-10-CM

## 2019-04-02 NOTE — Patient Instructions (Signed)
You may return to driving an automobile as long as you are no longer requiring oral narcotic pain relievers during the daytime.  It would be wise to start driving only short distances during the daylight and gradually increase from there as you feel comfortable.  Endocarditis is a potentially serious infection of heart valves or inside lining of the heart.  It occurs more commonly in patients with diseased heart valves (such as patient's with aortic or mitral valve disease) and in patients who have undergone heart valve repair or replacement.  Certain surgical and dental procedures may put you at risk, such as dental cleaning, other dental procedures, or any surgery involving the respiratory, urinary, gastrointestinal tract, gallbladder or prostate gland.   To minimize your chances for develooping endocarditis, maintain good oral health and seek prompt medical attention for any infections involving the mouth, teeth, gums, skin or urinary tract.    Always notify your doctor or dentist about your underlying heart valve condition before having any invasive procedures. You will need to take antibiotics before certain procedures, including all routine dental cleanings or other dental procedures.  Your cardiologist or dentist should prescribe these antibiotics for you to be taken ahead of time.  Make every effort to maintain a "heart-healthy" lifestyle with regular physical exercise and adherence to a low-fat, low-carbohydrate diet.  Continue to seek regular follow-up appointments with your primary care physician and/or cardiologist.   You may continue to gradually increase your physical activity as tolerated.  Refrain from any heavy lifting or strenuous use of your arms and shoulders until at least 8 weeks from the time of your surgery, and avoid activities that cause increased pain in your chest on the side of your surgical incision.  Otherwise you may continue to increase activities without any particular  limitations.  Increase the intensity and duration of physical activity gradually.        

## 2019-04-02 NOTE — Progress Notes (Signed)
HPI: Patient returns for routine postoperative follow-up having undergone Minimally Invasive Mitral valve repair and MAZE procedure on 03/07/2019.   The patient's early postoperative recovery while in the hospital was as expected.  The patient was in NSR at discharge.   Since hospital discharge the patient reports he is doing pretty well.  He has lost an additional 15 lbs since hospital discharge.  He has a normal appetite but states he hasn't been eating as many candy bars, potato chips, or sweet tea since his surgery.  He occasionally has some episodes of dizziness.  He states there is no specific pattern to these, they just randomly occur.  He also notes he is having issues seeing.  He states that he was told he had early cataracts at his last eye visit, but he states his vision has been worse since having surgery.  Finally his headaches have increased to daily in nature, and he attributes this to coumadin.   Current Outpatient Medications  Medication Sig Dispense Refill  . albuterol (PROVENTIL HFA;VENTOLIN HFA) 108 (90 Base) MCG/ACT inhaler Inhale 1-2 puffs into the lungs every 6 (six) hours as needed for wheezing or shortness of breath.     Marland Kitchen. amoxicillin (AMOXIL) 500 MG capsule Take 4 capsules 45 minutes prior to dental procedures. (Patient taking differently: Take 2,000 mg by mouth See admin instructions. Take 2000 mg by mouth 45 minutes prior to dental procedures.) 20 capsule 0  . esomeprazole (NEXIUM) 40 MG capsule Take 40 mg by mouth at bedtime.     . finasteride (PROSCAR) 5 MG tablet Take 5 mg by mouth at bedtime.     . fluorouracil (EFUDEX) 5 % cream Apply 1 application topically 2 (two) times daily as needed (skin spots).    . fluticasone (FLONASE) 50 MCG/ACT nasal spray Place 1 spray into both nostrils daily as needed for allergies or rhinitis.     . fluticasone-salmeterol (ADVAIR HFA) 230-21 MCG/ACT inhaler Inhale 2 puffs into the lungs 2 (two) times daily as needed (shortness of breath).     . Ketotifen Fumarate (ALLERGY EYE DROPS OP) Place 1 drop into both eyes daily as needed (allergies).    . metFORMIN (GLUCOPHAGE) 500 MG tablet Take 500 mg by mouth daily.    . metoprolol tartrate (LOPRESSOR) 25 MG tablet Take 0.5 tablets (12.5 mg total) by mouth 2 (two) times daily. 60 tablet 11  . montelukast (SINGULAIR) 10 MG tablet Take 10 mg by mouth daily.     . Multiple Vitamin (MULTIVITAMIN WITH MINERALS) TABS tablet Take 1 tablet by mouth daily.    . silver sulfADIAZINE (SILVADENE) 1 % cream Apply 1 application topically daily as needed (eczema).    . simvastatin (ZOCOR) 40 MG tablet Take 40 mg by mouth at bedtime.     . triamcinolone cream (KENALOG) 0.1 % Apply 1 application topically daily as needed (eczema). Mixed with eucerin    . warfarin (COUMADIN) 2.5 MG tablet Take 2 tablets (5 mg total) by mouth daily. Take two tablets daily or as instructed. 60 tablet 2   No current facility-administered medications for this visit.     Physical Exam:  BP 111/70   Pulse 90   Temp (!) 97.5 F (36.4 C) (Skin)   Resp 20   Ht 6\' 1"  (1.854 m)   Wt 174 lb (78.9 kg)   SpO2 97% Comment: RA  BMI 22.96 kg/m   Gen: no apparent distress, looks great Heart: RRR Lungs: CTA bilaterally Ext: no edema present Incisions:  C/D/I  Diagnostic Tests:  CXR; resolution of previous sub cutaneous emphysema, pleural effusion, some atelectasis remains present  A/P:  1. S/p MI MV Repair/MAZE- INR is therapeutic, continue coumadin regimen per Cardiology office, maintaining NSR 2. Weight loss is likely attributed to better eating habits, instructed patient to monitor and if he continues to lose weight he should be evaluated by his Primary physician 3. Vision loss- could be due to worsening of his cataracts, however patients can have visual complaints post bypass. Patient will get eye exam with his opthamologist.. if vision issues don't resolve he will require further evaluation. 4. Headaches- H/O of  migraine, likely exacerbated by coumadin as severe headache can be a side effect 5. RTC in 3 months with Dr. Roxy Manns, will need Echocardiogram prior to visit.  Ellwood Handler, PA-C [facility] [phone]

## 2019-04-03 ENCOUNTER — Ambulatory Visit (INDEPENDENT_AMBULATORY_CARE_PROVIDER_SITE_OTHER): Payer: Medicare Other | Admitting: *Deleted

## 2019-04-03 DIAGNOSIS — Z5181 Encounter for therapeutic drug level monitoring: Secondary | ICD-10-CM | POA: Diagnosis not present

## 2019-04-03 DIAGNOSIS — Z8679 Personal history of other diseases of the circulatory system: Secondary | ICD-10-CM

## 2019-04-03 DIAGNOSIS — I4891 Unspecified atrial fibrillation: Secondary | ICD-10-CM

## 2019-04-03 DIAGNOSIS — Z9889 Other specified postprocedural states: Secondary | ICD-10-CM

## 2019-04-03 LAB — POCT INR: INR: 3.8 — AB (ref 2.0–3.0)

## 2019-04-03 NOTE — Patient Instructions (Signed)
Description   Skip today's dose, then  Start taking 2 tablets daily except 1 tablet on Mondays and Fridays. Recheck INR in 1 week. Call with any new medications or procedures: Coumadin Clinic [phone] Main [phone]

## 2019-04-10 ENCOUNTER — Ambulatory Visit (INDEPENDENT_AMBULATORY_CARE_PROVIDER_SITE_OTHER): Payer: Medicare Other | Admitting: *Deleted

## 2019-04-10 ENCOUNTER — Other Ambulatory Visit: Payer: Self-pay

## 2019-04-10 DIAGNOSIS — I4891 Unspecified atrial fibrillation: Secondary | ICD-10-CM | POA: Diagnosis not present

## 2019-04-10 DIAGNOSIS — Z5181 Encounter for therapeutic drug level monitoring: Secondary | ICD-10-CM | POA: Diagnosis not present

## 2019-04-10 DIAGNOSIS — Z9889 Other specified postprocedural states: Secondary | ICD-10-CM | POA: Diagnosis not present

## 2019-04-10 DIAGNOSIS — Z8679 Personal history of other diseases of the circulatory system: Secondary | ICD-10-CM

## 2019-04-10 LAB — POCT INR: INR: 2.7 (ref 2.0–3.0)

## 2019-04-10 NOTE — Patient Instructions (Signed)
Description   Continue  taking 2 tablets daily except 1 tablet on Mondays and Fridays. Recheck INR in 2 weeks. Call with any new medications or procedures: Coumadin Clinic [phone] Main [phone]

## 2019-04-13 ENCOUNTER — Other Ambulatory Visit: Payer: Self-pay

## 2019-04-13 ENCOUNTER — Encounter: Payer: Self-pay | Admitting: Cardiology

## 2019-04-13 ENCOUNTER — Ambulatory Visit: Payer: Medicare Other | Admitting: Cardiology

## 2019-04-13 ENCOUNTER — Ambulatory Visit (INDEPENDENT_AMBULATORY_CARE_PROVIDER_SITE_OTHER): Payer: Medicare Other | Admitting: Cardiology

## 2019-04-13 VITALS — BP 100/70 | HR 104 | Ht 73.0 in | Wt 178.4 lb

## 2019-04-13 DIAGNOSIS — Z9889 Other specified postprocedural states: Secondary | ICD-10-CM

## 2019-04-13 DIAGNOSIS — Z8679 Personal history of other diseases of the circulatory system: Secondary | ICD-10-CM

## 2019-04-13 DIAGNOSIS — I480 Paroxysmal atrial fibrillation: Secondary | ICD-10-CM | POA: Diagnosis not present

## 2019-04-13 DIAGNOSIS — Z7901 Long term (current) use of anticoagulants: Secondary | ICD-10-CM | POA: Diagnosis not present

## 2019-04-13 NOTE — Patient Instructions (Signed)
Medication Instructions:  No medication changes today.  If you need a refill on your cardiac medications before your next appointment, please call your pharmacy.   Lab work: No lab work today.  If you have labs (blood work) drawn today and your tests are completely normal, you will receive your results only by: Marland Kitchen MyChart Message (if you have MyChart) OR . A paper copy in the mail If you have any lab test that is abnormal or we need to change your treatment, we will call you to review the results.  Testing/Procedures: You had an EKG today.  Follow-Up: At [facility], you and your health needs are our priority.  As part of our continuing mission to provide you with exceptional heart care, we have created designated Provider Care Teams.  These Care Teams include your primary Cardiologist (physician) and Advanced Practice Providers (APPs -  Physician Assistants and Nurse Practitioners) who all work together to provide you with the care you need, when you need it. You will need a follow up appointment in 3 months.    Any Other Special Instructions Will Be Listed Below (If Applicable).

## 2019-04-13 NOTE — Progress Notes (Signed)
Cardiology Office Note:    Date:  04/13/2019   ID:  Alejandro Burnett, DOB 1950/09/30, MRN [id number]  PCP:  Ignacia PalmaBeck, Mark C., MD  Cardiologist:  Norman HerrlichBrian Dakia Schifano, MD    Referring MD: Ignacia PalmaBeck, Mark C., MD    ASSESSMENT:    1. S/P mitral valve repair   2. S/P Maze operation for atrial fibrillation   3. PAF (paroxysmal atrial fibrillation) (HCC)   4. Chronic anticoagulation    PLAN:    In order of problems listed above:  1. Stable has made a good functional recovery declines cardiac rehabilitation. 2. Recheck EKG I think he is in sinus rhythm and if he maintains sinus rhythm over the next 3 months we can consider withdrawing anticoagulant therapy. 3. Status post Maze procedure check EKG he had a brief rapid heart rhythm a few days ago not severe sustained and postoperatively he is maintaining sinus rhythm. 4. Continue warfarin to his next visit goal INR 2.5   Next appointment: 3 months   Medication Adjustments/Labs and Tests Ordered: Current medicines are reviewed at length with the patient today.  Concerns regarding medicines are outlined above.  Orders Placed This Encounter  Procedures  . EKG 12-Lead   No orders of the defined types were placed in this encounter.   Chief Complaint  Patient presents with  . Mitral Regurgitation  . Atrial Fibrillation    History of Present Illness:    Alejandro Burnett is a 68 y.o. male with a hx of MR, PAF and Minimally Invasive Mitral valve repair and MAZE procedure on 03/07/2019  last seen by me 08/26/2018.EKG 03/08/2019 AF controlled rate. Compliance with diet, lifestyle and medications: Yes  He has made a slow steady improvement is doing heavy yard garden work still has some incisional pain not severe that one episode of awakening with rapid heartbeat that resolved within a few minutes.  He is compliant with his anticoagulant without bleeding and is very concerned about the option of withdrawing in the future depending on results of surgical  Maze procedure.  His shortness of breath and edema have cleared and he has had no anginal discomfort syncope or TIA Past Medical History:  Diagnosis Date  . Asthma   . Benign prostatic hyperplasia with urinary obstruction   . BPH (benign prostatic hyperplasia)   . Dyslipidemia   . Early cataracts, bilateral   . Gastroesophageal reflux disease   . Hematuria 04/17/2014   Microscopic  . Hyperlipemia   . Migraine headache   . Migraines   . Mitral regurgitation   . Mitral valve prolapse   . Nephrolithiasis 11/04/2015  . Non-rheumatic mitral regurgitation   . Normal coronary arteries   . Paroxysmal atrial fibrillation (HCC)   . Pre-diabetes   . S/P minimally invasive maze operation for atrial fibrillation 03/07/2019   Complete bilateral atrial lesion set using cryothermy and bipolar radiofrequency ablation with clipping of LA appendage via right mini thoracotomy approach  . S/P minimally invasive mitral valve repair 03/07/2019   Complex valvuloplasty including triangular resection of middle scallop of posterior leaflet and 34 mm Sorin Memo 4D ring annuloplasty via right mini thoracotomy approach    Past Surgical History:  Procedure Laterality Date  . APPENDECTOMY    . BACK SURGERY    . COLONOSCOPY    . MINIMALLY INVASIVE MAZE PROCEDURE N/A 03/07/2019   Procedure: MINIMALLY INVASIVE MAZE PROCEDURE;  Surgeon: Purcell Nailswen, Clarence H, MD;  [facility];  Service: Open Heart Surgery;  Laterality: N/A;  .  MITRAL VALVE REPAIR Right 03/07/2019   Procedure: MINIMALLY INVASIVE MITRAL VALVE REPAIR (MVR) USING MEMO 4D SIZE 34;  Surgeon: [name], MD;  Location: [place];  Service: Open Heart Surgery;  Laterality: Right;  . RIGHT/LEFT HEART CATH AND CORONARY ANGIOGRAPHY N/A 10/09/2018   Procedure: RIGHT/LEFT HEART CATH AND CORONARY ANGIOGRAPHY;  Surgeon: Sherren Mocha, MD;  Location: [place] [facility];  Service: Cardiovascular;  Laterality: N/A;  . TEE WITHOUT CARDIOVERSION N/A 08/24/2018    Procedure: TRANSESOPHAGEAL ECHOCARDIOGRAM (TEE);  Surgeon: Sanda Klein, MD;  Location: [place];  Service: Cardiovascular;  Laterality: N/A;  . TEE WITHOUT CARDIOVERSION N/A 03/07/2019   Procedure: TRANSESOPHAGEAL ECHOCARDIOGRAM (TEE);  Surgeon: [name], MD;  Location: [place];  Service: Open Heart Surgery;  Laterality: N/A;  . TONSILLECTOMY      Current Medications: Current Meds  Medication Sig  . albuterol (PROVENTIL HFA;VENTOLIN HFA) 108 (90 Base) MCG/ACT inhaler Inhale 1-2 puffs into the lungs every 6 (six) hours as needed for wheezing or shortness of breath.   Marland Kitchen amoxicillin (AMOXIL) 500 MG capsule Take 4 capsules 45 minutes prior to dental procedures.  Marland Kitchen esomeprazole (NEXIUM) 40 MG capsule Take 40 mg by mouth at bedtime.   . finasteride (PROSCAR) 5 MG tablet Take 5 mg by mouth at bedtime.   . fluorouracil (EFUDEX) 5 % cream Apply 1 application topically 2 (two) times daily as needed (skin spots).  . fluticasone (FLONASE) 50 MCG/ACT nasal spray Place 1 spray into both nostrils daily as needed for allergies or rhinitis.   . fluticasone-salmeterol (ADVAIR HFA) 230-21 MCG/ACT inhaler Inhale 2 puffs into the lungs 2 (two) times daily as needed (shortness of breath).  . Ketotifen Fumarate (ALLERGY EYE DROPS OP) Place 1 drop into both eyes daily as needed (allergies).  . metoprolol tartrate (LOPRESSOR) 25 MG tablet Take 0.5 tablets (12.5 mg total) by mouth 2 (two) times daily.  . montelukast (SINGULAIR) 10 MG tablet Take 10 mg by mouth daily.   . silver sulfADIAZINE (SILVADENE) 1 % cream Apply 1 application topically daily as needed (eczema).  . simvastatin (ZOCOR) 40 MG tablet Take 40 mg by mouth at bedtime.   . triamcinolone cream (KENALOG) 0.1 % Apply 1 application topically daily as needed (eczema). Mixed with eucerin  . warfarin (COUMADIN) 2.5 MG tablet Take 2 tablets (5 mg total) by mouth daily. Take two tablets daily or as instructed. (Patient taking differently: Take 5 mg by  mouth as directed. Take two tablets daily everyday except 1 tablet on Mon and Fri)     Allergies:   Apple, Banana, Peanut-containing drug products, Strawberry (diagnostic), and Tamsulosin   Social History   Socioeconomic History  . Marital status: Married    Spouse name: Not on file  . Number of children: Not on file  . Years of education: Not on file  . Highest education level: Not on file  Occupational History  . Not on file  Social Needs  . Financial resource strain: Not on file  . Food insecurity    Worry: Not on file    Inability: Not on file  . Transportation needs    Medical: Not on file    Non-medical: Not on file  Tobacco Use  . Smoking status: Former Smoker    Packs/day: 0.50    Types: Cigarettes    Quit date: 1986    Years since quitting: 34.6  . Smokeless tobacco: Former Systems developer    Types: Chew  Substance and Sexual Activity  .  Alcohol use: Never    Frequency: Never  . Drug use: Never  . Sexual activity: Yes    Birth control/protection: None  Lifestyle  . Physical activity    Days per week: Not on file    Minutes per session: Not on file  . Stress: Not on file  Relationships  . Social Musicianconnections    Talks on phone: Not on file    Gets together: Not on file    Attends religious service: Not on file    Active member of club or organization: Not on file    Attends meetings of clubs or organizations: Not on file    Relationship status: Not on file  Other Topics Concern  . Not on file  Social History Narrative  . Not on file     Family History: The patient's family history includes Cancer in his father; Hypertension in his mother. ROS:   Please see the history of present illness.    All other systems reviewed and are negative.  EKGs/Labs/Other Studies Reviewed:    The following studies were reviewed today:  EKG:  EKG ordered today and personally reviewed.  The ekg ordered today demonstrates sinus rhythm poor R wave progression nonspecific T waves  Chest x-ray 04/02/2019 showed improvement in atelectasis residual small pleural effusion had resolved as well as pneumothorax and subcutaneous emphysema. Recent Labs: 03/05/2019: ALT 10 03/08/2019: Magnesium 2.5 03/13/2019: BUN 18; Creatinine, Ser 0.84; Hemoglobin 10.1; Platelets 127; Potassium 3.8; Sodium 136  Recent Lipid Panel No results found for: CHOL, TRIG, HDL, CHOLHDL, VLDL, LDLCALC, LDLDIRECT  Physical Exam:    VS:  BP 100/70 (BP Location: Right Arm, Patient Position: Sitting, Cuff Size: Normal)   Pulse (!) 104   Ht 6\' 1"  (1.854 m)   Wt 178 lb 6.4 oz (80.9 kg)   SpO2 97%   BMI 23.54 kg/m     Wt Readings from Last 3 Encounters:  04/13/19 178 lb 6.4 oz (80.9 kg)  04/02/19 174 lb (78.9 kg)  03/13/19 182 lb 3.2 oz (82.6 kg)     GEN:  Well nourished, well developed in no acute distress HEENT: Normal NECK: No JVD; No carotid bruits LYMPHATICS: No lymphadenopathy CARDIAC: His wounds are all healed RRR, no murmurs, rubs, gallops RESPIRATORY:  Clear to auscultation without rales, wheezing or rhonchi  ABDOMEN: Soft, non-tender, non-distended MUSCULOSKELETAL:  No edema; No deformity  SKIN: Warm and dry NEUROLOGIC:  Alert and oriented x 3 PSYCHIATRIC:  Normal affect    Signed, Norman HerrlichBrian Kimberla Driskill, MD  04/13/2019 4:59 PM    [facility]

## 2019-04-24 ENCOUNTER — Other Ambulatory Visit: Payer: Self-pay

## 2019-04-24 ENCOUNTER — Ambulatory Visit (INDEPENDENT_AMBULATORY_CARE_PROVIDER_SITE_OTHER): Payer: Medicare Other | Admitting: *Deleted

## 2019-04-24 DIAGNOSIS — Z8679 Personal history of other diseases of the circulatory system: Secondary | ICD-10-CM

## 2019-04-24 DIAGNOSIS — I4891 Unspecified atrial fibrillation: Secondary | ICD-10-CM | POA: Diagnosis not present

## 2019-04-24 DIAGNOSIS — Z9889 Other specified postprocedural states: Secondary | ICD-10-CM

## 2019-04-24 DIAGNOSIS — Z5181 Encounter for therapeutic drug level monitoring: Secondary | ICD-10-CM | POA: Diagnosis not present

## 2019-04-24 LAB — POCT INR: INR: 2.6 (ref 2.0–3.0)

## 2019-04-24 NOTE — Patient Instructions (Signed)
Description   Continue  taking 2 tablets daily except 1 tablet on Mondays and Fridays. Recheck INR in 3 weeks. Call with any new medications or procedures: [facility] [phone] Main [phone]

## 2019-05-15 ENCOUNTER — Ambulatory Visit (INDEPENDENT_AMBULATORY_CARE_PROVIDER_SITE_OTHER): Payer: Medicare Other | Admitting: Pharmacist Clinician (PhC)/ Clinical Pharmacy Specialist

## 2019-05-15 ENCOUNTER — Other Ambulatory Visit: Payer: Self-pay

## 2019-05-15 DIAGNOSIS — Z9889 Other specified postprocedural states: Secondary | ICD-10-CM

## 2019-05-15 DIAGNOSIS — Z5181 Encounter for therapeutic drug level monitoring: Secondary | ICD-10-CM | POA: Diagnosis not present

## 2019-05-15 DIAGNOSIS — I4891 Unspecified atrial fibrillation: Secondary | ICD-10-CM | POA: Diagnosis not present

## 2019-05-15 DIAGNOSIS — Z8679 Personal history of other diseases of the circulatory system: Secondary | ICD-10-CM

## 2019-05-15 DIAGNOSIS — Z7901 Long term (current) use of anticoagulants: Secondary | ICD-10-CM

## 2019-05-15 LAB — POCT INR: INR: 1.8 — AB (ref 2.0–3.0)

## 2019-05-29 ENCOUNTER — Ambulatory Visit (INDEPENDENT_AMBULATORY_CARE_PROVIDER_SITE_OTHER): Payer: Medicare Other | Admitting: Cardiology

## 2019-05-29 ENCOUNTER — Other Ambulatory Visit: Payer: Self-pay

## 2019-05-29 DIAGNOSIS — Z5181 Encounter for therapeutic drug level monitoring: Secondary | ICD-10-CM

## 2019-05-29 DIAGNOSIS — I4891 Unspecified atrial fibrillation: Secondary | ICD-10-CM

## 2019-05-29 DIAGNOSIS — Z8679 Personal history of other diseases of the circulatory system: Secondary | ICD-10-CM

## 2019-05-29 DIAGNOSIS — Z9889 Other specified postprocedural states: Secondary | ICD-10-CM

## 2019-05-29 LAB — POCT INR: INR: 2 (ref 2.0–3.0)

## 2019-05-29 NOTE — Patient Instructions (Signed)
Continue warfarin 2 tablets daily except 1 tablet on Mondays and Fridays. Recheck INR in 4 weeks. Call with any new medications or procedures: [facility] [phone] Main [phone]

## 2019-06-04 ENCOUNTER — Other Ambulatory Visit: Payer: Self-pay | Admitting: Thoracic Surgery (Cardiothoracic Vascular Surgery)

## 2019-06-04 ENCOUNTER — Encounter: Payer: Self-pay | Admitting: Thoracic Surgery (Cardiothoracic Vascular Surgery)

## 2019-06-04 ENCOUNTER — Ambulatory Visit (INDEPENDENT_AMBULATORY_CARE_PROVIDER_SITE_OTHER): Payer: Self-pay | Admitting: Thoracic Surgery (Cardiothoracic Vascular Surgery)

## 2019-06-04 ENCOUNTER — Other Ambulatory Visit: Payer: Self-pay

## 2019-06-04 VITALS — BP 117/80 | HR 89 | Temp 97.7°F | Resp 16 | Ht 73.0 in | Wt 177.8 lb

## 2019-06-04 DIAGNOSIS — Z8679 Personal history of other diseases of the circulatory system: Secondary | ICD-10-CM

## 2019-06-04 DIAGNOSIS — I340 Nonrheumatic mitral (valve) insufficiency: Secondary | ICD-10-CM

## 2019-06-04 DIAGNOSIS — Z9889 Other specified postprocedural states: Secondary | ICD-10-CM

## 2019-06-04 NOTE — Progress Notes (Addendum)
[address]       [place] [address]             [phone]     CARDIOTHORACIC SURGERY OFFICE NOTE  Referring Provider is Richardo Priest, MD PCP is Virl Son., MD   HPI:  Patient is a 68 year old male with history of mitral valve prolapse and recurrent paroxysmal atrial fibrillation who returns to the office today for routine follow-up nearly 44-month status post minimally invasive mitral valve repair and Maze procedure on March 07, 2019.  The patient's early postoperative recovery in the hospital was uneventful and he was discharged home in sinus rhythm.  He was last seen here in our office on April 02, 2019 at which time he was doing well.  He was seen in follow-up by Dr. Bettina Gavia on April 13, 2019 at which time he remained in sinus rhythm although the patient did report 1 or 2 brief episodes of tachypalpitations.  He has been chronically anticoagulated using warfarin since hospital discharge.  His INR was last checked on May 29, 2019 at which time he was low therapeutic with INR 2.0.  He presents to our office today and reports that overall he is doing quite well.  He states that he is not back to 100% and he still gets mild shortness of breath and tired with more strenuous physical exertion.  He also has some soreness in his chest whenever he tries to lift things that are heavy or perform any type of particularly strenuous labor.  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  However, the patient reports that he overall is back doing most of his previous jobs which incurs fairly strenuous labor.  He states that he has not had any tachypalpitations in the last week or 2, but he has had at least 2 or 3 over the last couple of months.  He otherwise feels well and is pleased with his progress.  His only other complaint is that his vision is slightly off and he thinks it may be have been that way ever since surgery.   He states that distant vision is normal but he has more trouble reading and with close eyesight.  There is no difference between left and right eye.  He admits that he knows that he does have cataracts and he has not seen in an ophthalmologist in a long time.  He otherwise has no complaints.   Current Outpatient Medications  Medication Sig Dispense Refill   albuterol (PROVENTIL HFA;VENTOLIN HFA) 108 (90 Base) MCG/ACT inhaler Inhale 1-2 puffs into the lungs every 6 (six) hours as needed for wheezing or shortness of breath.      amoxicillin (AMOXIL) 500 MG capsule Take 4 capsules 45 minutes prior to dental procedures. 20 capsule 0   esomeprazole (NEXIUM) 40 MG capsule Take 40 mg by mouth at bedtime.      finasteride (PROSCAR) 5 MG tablet Take 5 mg by mouth at bedtime.      fluorouracil (EFUDEX) 5 % cream Apply 1 application topically 2 (two) times daily as needed (skin spots).     fluticasone (FLONASE) 50 MCG/ACT nasal spray Place 1 spray into both nostrils daily as needed for allergies or rhinitis.      fluticasone-salmeterol (ADVAIR HFA) 230-21 MCG/ACT inhaler Inhale 2 puffs into the lungs 2 (two) times daily as needed (shortness of breath).     Ketotifen Fumarate (ALLERGY  EYE DROPS OP) Place 1 drop into both eyes daily as needed (allergies).     metoprolol tartrate (LOPRESSOR) 25 MG tablet Take 0.5 tablets (12.5 mg total) by mouth 2 (two) times daily. 60 tablet 11   montelukast (SINGULAIR) 10 MG tablet Take 10 mg by mouth daily.      silver sulfADIAZINE (SILVADENE) 1 % cream Apply 1 application topically daily as needed (eczema).     simvastatin (ZOCOR) 40 MG tablet Take 40 mg by mouth at bedtime.      triamcinolone cream (KENALOG) 0.1 % Apply 1 application topically daily as needed (eczema). Mixed with eucerin     warfarin (COUMADIN) 2.5 MG tablet Take 2 tablets (5 mg total) by mouth daily. Take two tablets daily or as instructed. (Patient taking differently: Take 5 mg by mouth as  directed. Take two tablets daily everyday except 1 tablet on Mon and Fri) 60 tablet 2   No current facility-administered medications for this visit.       Physical Exam:   BP 117/80 (BP Location: Right Arm)    Pulse 89    Temp 97.7 F (36.5 C) (Skin)    Resp 16    Ht 6\' 1"  (1.854 m)    Wt 177 lb 12.8 oz (80.6 kg)    SpO2 95% Comment: RA   BMI 23.46 kg/m   General:  Well-appearing  Chest:   Clear to auscultation  CV:   Regular rate and rhythm without murmur  Incisions:  Well-healed  Abdomen:  Soft nontender  Extremities:  Warm and well-perfused  Diagnostic Tests:  2 channel telemetry rhythm strip demonstrates normal sinus rhythm   Impression:  Patient is doing very well and maintaining sinus rhythm nearly 25-month status post minimally invasive mitral valve repair and Maze procedure.  He has had a couple of brief episodes of tachypalpitations, although they seem to be decreasing in frequency and none have lasted more than a few seconds in duration.  He has not yet had a follow-up echocardiogram since his surgery.  Clinically he appears to be doing well.    Plan:  We have not recommended any change to the patient's current medications.  He has asked about stopping Coumadin, and I feel that he should remain on long-term anticoagulation for at least another month or 2.  After that it would be reasonable to consider 30-day event monitor or ZIO patch to guide decisions about long-term anticoagulation subsequently depending upon whether or not he is still having any episodes of paroxysmal atrial fibrillation or atrial flutter.  I have encouraged the patient to continue to gradually increase his physical activity without any particular limitations.  The patient has been reminded regarding the importance of dental hygiene and the lifelong need for antibiotic prophylaxis for all dental cleanings and other related invasive procedures.  We will asked that routine follow-up echocardiogram be  performed as soon as possible.  Patient plans to follow-up with his ophthalmologist regarding his visual changes.  Patient will return to our office for routine follow-up and rhythm check in approximately 3 months.  He will call and return sooner should specific problems or questions arise.    2-month. Salvatore Decent, MD 06/04/2019 3:02 PM

## 2019-06-04 NOTE — Patient Instructions (Signed)

## 2019-06-21 ENCOUNTER — Telehealth: Payer: Self-pay | Admitting: Cardiology

## 2019-06-21 NOTE — Telephone Encounter (Signed)
°*  STAT* If patient is at the pharmacy, call can be transferred to refill team.   1. Which medications need to be refilled? (please list name of each medication and dose if known) Warfarin 2.5mg  takes 2 daily on t,w,th and 1 on rest daily  2. Which pharmacy/location (including street and city if local pharmacy) is medication to be sent to? [facility]  3. Do they need a 30 day or 90 day supply? With refills   HE IS ALMOST OUT

## 2019-06-22 ENCOUNTER — Ambulatory Visit: Payer: Medicare Other | Admitting: Cardiology

## 2019-06-26 ENCOUNTER — Ambulatory Visit (INDEPENDENT_AMBULATORY_CARE_PROVIDER_SITE_OTHER): Payer: Medicare Other | Admitting: *Deleted

## 2019-06-26 ENCOUNTER — Other Ambulatory Visit: Payer: Self-pay | Admitting: Cardiology

## 2019-06-26 ENCOUNTER — Other Ambulatory Visit: Payer: Self-pay

## 2019-06-26 DIAGNOSIS — Z5181 Encounter for therapeutic drug level monitoring: Secondary | ICD-10-CM | POA: Diagnosis not present

## 2019-06-26 DIAGNOSIS — Z8679 Personal history of other diseases of the circulatory system: Secondary | ICD-10-CM | POA: Diagnosis not present

## 2019-06-26 DIAGNOSIS — I4891 Unspecified atrial fibrillation: Secondary | ICD-10-CM | POA: Diagnosis not present

## 2019-06-26 DIAGNOSIS — Z9889 Other specified postprocedural states: Secondary | ICD-10-CM | POA: Diagnosis not present

## 2019-06-26 LAB — POCT INR: INR: 2 (ref 2.0–3.0)

## 2019-06-26 NOTE — Patient Instructions (Signed)
Increase dose to 2 tablets daily except 1 tablet on Mondays. Recheck INR in 4 weeks. Call with any new medications or procedures: [facility] [phone] Main [phone]

## 2019-06-26 NOTE — Telephone Encounter (Signed)
Filled in office today

## 2019-07-12 ENCOUNTER — Other Ambulatory Visit: Payer: Self-pay

## 2019-07-12 ENCOUNTER — Ambulatory Visit (INDEPENDENT_AMBULATORY_CARE_PROVIDER_SITE_OTHER): Payer: Medicare Other

## 2019-07-12 DIAGNOSIS — I340 Nonrheumatic mitral (valve) insufficiency: Secondary | ICD-10-CM

## 2019-07-12 NOTE — Progress Notes (Signed)
Complete echocardiogram has been performed.  Jimmy Jama Krichbaum RDCS, RVT 

## 2019-07-22 NOTE — Progress Notes (Signed)
Cardiology Office Note:    Date:  07/23/2019   ID:  Alejandro Burnett, DOB [birth year], MRN [id number]  PCP:  [name], MD  Cardiologist:  Norman Herrlich, MD    Referring MD: [name], MD    ASSESSMENT:    1. S/P mitral valve repair   2. S/P Maze operation for atrial fibrillation   3. PAF (paroxysmal atrial fibrillation) (HCC)   4. Long term (current) use of anticoagulants    PLAN:    In order of problems listed above:  1. Stable has made a good result from his surgery echocardiogram is reassuring for LV function and valve function and will plan on checking it 2 weeks ago and if no atrial fibrillation I think we can safely withdraw warfarin as he was highly symptomatic with his atrial fibrillation periprocedural. 2. Stable hyperlipidemia continue with statin recent labs in his PCP office requested   Next appointment: 6 months   Medication Adjustments/Labs and Tests Ordered: Current medicines are reviewed at length with the patient today.  Concerns regarding medicines are outlined above.  No orders of the defined types were placed in this encounter.  No orders of the defined types were placed in this encounter.   Chief Complaint  Patient presents with  . Follow-up    after MV repair and   . Atrial Fibrillation  . Anticoagulation    History of Present Illness:    Alejandro Burnett is a 68 y.o. male with a hx of  MR secondary to MVP, PAF and Minimally Invasive Mitral valve repair and MAZE procedure on 03/07/2019 last seen 04/13/2019 maintaining sinus rhythm. Compliance with diet, lifestyle and medications: Yes  I reviewed the labs and echocardiogram below with the patient.   3wk ago (06/26/19) 61mo ago (05/29/19) 5mo ago (05/15/19) 93mo ago (04/24/19)   INR 2.0 - 3.0 2.0  2.0  1.8Abnormal   2.6   Post MVR Echo 07/12/2019:  1. Left ventricular ejection fraction, by visual estimation, is 55 to 60%. The left ventricle has normal size and  function. There is no left  ventricular hypertrophy.  2. Left ventricular diastolic parameters are indeterminate.  3. Global right ventricle has normal systolic function.The right ventricular size is normal. No increase in right ventricular wall thickness.  4. Left atrial size was mildly dilated.  5. Right atrial size was normal.  6. The mitral valve has been repaired with mitral annular ring well seated. The mean gradient is 6 mmHg, Peak velocity is 1.71 m/s, MV PHT - no evidence of possible stenosis. No evidence of mitral valve regurgitation.  7. The tricuspid valve is normal in structure. Tricuspid valve regurgitation mild-moderate.  8. The aortic valve is normal in structure. Aortic valve regurgitation is not visualized. No evidence of aortic valve sclerosis or stenosis.  9. The pulmonic valve was normal in structure. Pulmonic valve regurgitation is not visualized. 10. Normal pulmonary artery systolic pressure.  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  Strength and endurance are good no chest pain with activity shortness of breath palpitation or syncope.  He wonders how long he will need to take warfarin what I like to do is apply 2 weeks he will monitor if he has no atrial fibrillation withdraws anticoagulant after Maze procedure.  Said no bleeding complications from warfarin is followed in our  clinic. Past Medical History:  Diagnosis Date  . Asthma   . Benign prostatic hyperplasia with urinary obstruction   . BPH (benign prostatic hyperplasia)   . Dyslipidemia   . Early cataracts, bilateral   . Gastroesophageal reflux disease   . Hematuria 04/17/2014   Microscopic  . Hyperlipemia   . Migraine headache   . Migraines   . Mitral regurgitation   . Mitral valve prolapse   . Nephrolithiasis 11/04/2015  . Non-rheumatic mitral regurgitation   . Normal coronary  arteries   . Paroxysmal atrial fibrillation (HCC)   . Pre-diabetes   . S/P minimally invasive maze operation for atrial fibrillation 03/07/2019   Complete bilateral atrial lesion set using cryothermy and bipolar radiofrequency ablation with clipping of LA appendage via right mini thoracotomy approach  . S/P minimally invasive mitral valve repair 03/07/2019   Complex valvuloplasty including triangular resection of middle scallop of posterior leaflet and 34 mm Sorin Memo 4D ring annuloplasty via right mini thoracotomy approach    Past Surgical History:  Procedure Laterality Date  . APPENDECTOMY    . BACK SURGERY    . COLONOSCOPY    . MINIMALLY INVASIVE MAZE PROCEDURE N/A 03/07/2019   Procedure: MINIMALLY INVASIVE MAZE PROCEDURE;  Surgeon: Purcell Nailswen, Clarence H, MD;  [facility];  Service: Open Heart Surgery;  Laterality: N/A;  . MITRAL VALVE REPAIR Right 03/07/2019   Procedure: MINIMALLY INVASIVE MITRAL VALVE REPAIR (MVR) USING MEMO 4D SIZE 34;  Surgeon: Purcell Nailswen, Clarence H, MD;  [facility];  Service: Open Heart Surgery;  Laterality: Right;  . RIGHT/LEFT HEART CATH AND CORONARY ANGIOGRAPHY N/A 10/09/2018   Procedure: RIGHT/LEFT HEART CATH AND CORONARY ANGIOGRAPHY;  Surgeon: Tonny Bollmanooper, Michael, MD;  [facility];  Service: Cardiovascular;  Laterality: N/A;  . TEE WITHOUT CARDIOVERSION N/A 08/24/2018   Procedure: TRANSESOPHAGEAL ECHOCARDIOGRAM (TEE);  Surgeon: Thurmon Fairroitoru, Mihai, MD;  [facility];  Service: Cardiovascular;  Laterality: N/A;  . TEE WITHOUT CARDIOVERSION N/A 03/07/2019   Procedure: TRANSESOPHAGEAL ECHOCARDIOGRAM (TEE);  Surgeon: Purcell Nailswen, Clarence H, MD;  [facility];  Service: Open Heart Surgery;  Laterality: N/A;  . TONSILLECTOMY      Current Medications: Current Meds  Medication Sig  . albuterol (PROVENTIL HFA;VENTOLIN HFA) 108 (90 Base) MCG/ACT inhaler Inhale 1-2 puffs into the lungs every 6 (six) hours as needed for wheezing or shortness of breath.   Marland Kitchen.  amoxicillin (AMOXIL) 500 MG capsule Take 4 capsules 45 minutes prior to dental procedures.  Marland Kitchen. esomeprazole (NEXIUM) 40 MG capsule Take 40 mg by mouth at bedtime.   . finasteride (PROSCAR) 5 MG tablet Take 5 mg by mouth at bedtime.   . fluorouracil (EFUDEX) 5 % cream Apply 1 application topically 2 (two) times daily as needed (skin spots).  . fluticasone (FLONASE) 50 MCG/ACT nasal spray Place 1 spray into both nostrils daily as needed for allergies or rhinitis.   . fluticasone-salmeterol (ADVAIR HFA) 230-21 MCG/ACT inhaler Inhale 2 puffs into the lungs 2 (two) times daily as needed (shortness of breath).  . Ketotifen Fumarate (ALLERGY EYE DROPS OP) Place 1 drop into both eyes daily as needed (allergies).  . metoprolol tartrate (LOPRESSOR) 25 MG tablet Take 0.5 tablets (12.5 mg total) by mouth 2 (two) times daily.  . montelukast (SINGULAIR) 10 MG tablet Take 10 mg by mouth daily.   . silver sulfADIAZINE (SILVADENE) 1 % cream Apply 1 application topically daily as needed (eczema).  . simvastatin (ZOCOR) 40 MG tablet Take 40 mg by mouth  at bedtime.   . triamcinolone cream (KENALOG) 0.1 % Apply 1 application topically daily as needed (eczema). Mixed with eucerin  . warfarin (COUMADIN) 2.5 MG tablet TAKE 2 TABLETS BY MOUTH DAILY     Allergies:   Apple, Banana, Peanut-containing drug products, Strawberry (diagnostic), and Tamsulosin   Social History   Socioeconomic History  . Marital status: Married    Spouse name: Not on file  . Number of children: Not on file  . Years of education: Not on file  . Highest education level: Not on file  Occupational History  . Not on file  Social Needs  . Financial resource strain: Not on file  . Food insecurity    Worry: Not on file    Inability: Not on file  . Transportation needs    Medical: Not on file    Non-medical: Not on file  Tobacco Use  . Smoking status: Former Smoker    Packs/day: 0.50    Types: Cigarettes    Quit date: 1986    Years since  quitting: 34.9  . Smokeless tobacco: Former Systems developer    Types: Chew  Substance and Sexual Activity  . Alcohol use: Never    Frequency: Never  . Drug use: Never  . Sexual activity: Yes    Birth control/protection: None  Lifestyle  . Physical activity    Days per week: Not on file    Minutes per session: Not on file  . Stress: Not on file  Relationships  . Social Herbalist on phone: Not on file    Gets together: Not on file    Attends religious service: Not on file    Active member of club or organization: Not on file    Attends meetings of clubs or organizations: Not on file    Relationship status: Not on file  Other Topics Concern  . Not on file  Social History Narrative  . Not on file     Family History: The patient's family history includes Cancer in his father; Hypertension in his mother. ROS:   Please see the history of present illness.    All other systems reviewed and are negative.  EKGs/Labs/Other Studies Reviewed:    The following studies were reviewed today:  Recent Labs: 03/05/2019: ALT 10 03/08/2019: Magnesium 2.5 03/13/2019: BUN 18; Creatinine, Ser 0.84; Hemoglobin 10.1; Platelets 127; Potassium 3.8; Sodium 136  Recent Lipid Panel No results found for: CHOL, TRIG, HDL, CHOLHDL, VLDL, LDLCALC, LDLDIRECT  Physical Exam:    VS:  BP 102/70 (BP Location: Right Arm, Patient Position: Sitting, Cuff Size: Normal)   Pulse 86   Ht 6\' 1"  (1.854 m)   Wt 183 lb 9.6 oz (83.3 kg)   SpO2 97%   BMI 24.22 kg/m     Wt Readings from Last 3 Encounters:  07/23/19 183 lb 9.6 oz (83.3 kg)  06/04/19 177 lb 12.8 oz (80.6 kg)  04/13/19 178 lb 6.4 oz (80.9 kg)     GEN:  Well nourished, well developed in no acute distress HEENT: Normal NECK: No JVD; No carotid bruits LYMPHATICS: No lymphadenopathy CARDIAC: RRR, no murmurs, rubs, gallops RESPIRATORY:  Clear to auscultation without rales, wheezing or rhonchi  ABDOMEN: Soft, non-tender, non-distended  MUSCULOSKELETAL:  No edema; No deformity  SKIN: Warm and dry NEUROLOGIC:  Alert and oriented x 3 PSYCHIATRIC:  Normal affect    Signed, Shirlee More, MD  07/23/2019 3:53 PM    [facility]

## 2019-07-23 ENCOUNTER — Encounter: Payer: Self-pay | Admitting: Cardiology

## 2019-07-23 ENCOUNTER — Other Ambulatory Visit: Payer: Self-pay

## 2019-07-23 ENCOUNTER — Ambulatory Visit (INDEPENDENT_AMBULATORY_CARE_PROVIDER_SITE_OTHER): Payer: Medicare Other | Admitting: Cardiology

## 2019-07-23 VITALS — BP 102/70 | HR 86 | Ht 73.0 in | Wt 183.6 lb

## 2019-07-23 DIAGNOSIS — Z8679 Personal history of other diseases of the circulatory system: Secondary | ICD-10-CM

## 2019-07-23 DIAGNOSIS — Z7901 Long term (current) use of anticoagulants: Secondary | ICD-10-CM

## 2019-07-23 DIAGNOSIS — Z09 Encounter for follow-up examination after completed treatment for conditions other than malignant neoplasm: Secondary | ICD-10-CM | POA: Diagnosis not present

## 2019-07-23 DIAGNOSIS — Z9889 Other specified postprocedural states: Secondary | ICD-10-CM | POA: Diagnosis not present

## 2019-07-23 DIAGNOSIS — I480 Paroxysmal atrial fibrillation: Secondary | ICD-10-CM | POA: Diagnosis not present

## 2019-07-23 NOTE — Patient Instructions (Signed)
Medication Instructions:  Your physician recommends that you continue on your current medications as directed. Please refer to the Current Medication list given to you today.  *If you need a refill on your cardiac medications before your next appointment, please call your pharmacy*  Lab Work: None  If you have labs (blood work) drawn today and your tests are completely normal, you will receive your results only by: Marland Kitchen MyChart Message (if you have MyChart) OR . A paper copy in the mail If you have any lab test that is abnormal or we need to change your treatment, we will call you to review the results.  Testing/Procedures: Your physician has recommended that you wear a ZIO monitor. ZIO monitors are medical devices that record the heart's electrical activity. Doctors most often use these monitors to diagnose arrhythmias. Arrhythmias are problems with the speed or rhythm of the heartbeat. The monitor is a small, portable device. You can wear one while you do your normal daily activities. This is usually used to diagnose what is causing palpitations/syncope (passing out). Wear for 14 days.   Follow-Up: At [facility], you and your health needs are our priority.  As part of our continuing mission to provide you with exceptional heart care, we have created designated Provider Care Teams.  These Care Teams include your primary Cardiologist (physician) and Advanced Practice Providers (APPs -  Physician Assistants and Nurse Practitioners) who all work together to provide you with the care you need, when you need it.  Your next appointment:   6 month(s)  The format for your next appointment:   In Person  Provider:   Shirlee More, MD

## 2019-07-23 NOTE — Addendum Note (Signed)
Addended by: Austin Miles on: 07/23/2019 04:11 PM   Modules accepted: Orders

## 2019-07-24 ENCOUNTER — Ambulatory Visit (INDEPENDENT_AMBULATORY_CARE_PROVIDER_SITE_OTHER): Payer: Medicare Other | Admitting: *Deleted

## 2019-07-24 DIAGNOSIS — Z5181 Encounter for therapeutic drug level monitoring: Secondary | ICD-10-CM | POA: Diagnosis not present

## 2019-07-24 DIAGNOSIS — Z9889 Other specified postprocedural states: Secondary | ICD-10-CM | POA: Diagnosis not present

## 2019-07-24 DIAGNOSIS — Z8679 Personal history of other diseases of the circulatory system: Secondary | ICD-10-CM

## 2019-07-24 DIAGNOSIS — I4891 Unspecified atrial fibrillation: Secondary | ICD-10-CM

## 2019-07-24 LAB — POCT INR: INR: 2.6 (ref 2.0–3.0)

## 2019-07-24 NOTE — Patient Instructions (Signed)
Continue warfarin 2 tablets daily except 1 tablet on Mondays. Recheck INR in 6 weeks. Call with any new medications or procedures: Coumadin Clinic [phone] Main [phone]

## 2019-08-03 ENCOUNTER — Ambulatory Visit (INDEPENDENT_AMBULATORY_CARE_PROVIDER_SITE_OTHER): Payer: Medicare Other

## 2019-08-03 DIAGNOSIS — I480 Paroxysmal atrial fibrillation: Secondary | ICD-10-CM

## 2019-08-23 ENCOUNTER — Other Ambulatory Visit: Payer: Self-pay | Admitting: Cardiology

## 2019-08-31 ENCOUNTER — Other Ambulatory Visit: Payer: Self-pay | Admitting: Cardiology

## 2019-09-03 ENCOUNTER — Ambulatory Visit (INDEPENDENT_AMBULATORY_CARE_PROVIDER_SITE_OTHER): Payer: Medicare Other | Admitting: Thoracic Surgery (Cardiothoracic Vascular Surgery)

## 2019-09-03 ENCOUNTER — Other Ambulatory Visit: Payer: Self-pay

## 2019-09-03 ENCOUNTER — Encounter: Payer: Self-pay | Admitting: Thoracic Surgery (Cardiothoracic Vascular Surgery)

## 2019-09-03 VITALS — BP 103/69 | HR 86 | Temp 97.7°F | Resp 20 | Ht 73.0 in | Wt 174.0 lb

## 2019-09-03 DIAGNOSIS — I480 Paroxysmal atrial fibrillation: Secondary | ICD-10-CM

## 2019-09-03 DIAGNOSIS — Z8679 Personal history of other diseases of the circulatory system: Secondary | ICD-10-CM | POA: Diagnosis not present

## 2019-09-03 DIAGNOSIS — Z9889 Other specified postprocedural states: Secondary | ICD-10-CM | POA: Diagnosis not present

## 2019-09-03 NOTE — Progress Notes (Signed)
[address]       [place] [address]             [phone]     CARDIOTHORACIC SURGERY OFFICE NOTE  Referring Provider is Alejandro Priest, MD PCP is Alejandro Son., MD   HPI:  Patient is a 69 year old male with history of mitral valve prolapse and recurrent paroxysmal atrial fibrillation who returns to the office today for routine follow-up nearly 13-months status post minimally invasive mitral valve repair and Maze procedure on March 07, 2019.  The patient's early postoperative recovery in the hospital was uneventful and he was discharged home in sinus rhythm.  He was last seen here in our office on June 04, 2019 at which time he was doing well and maintaining sinus rhythm.  His only significant complaint at that time was that his eyesight seem to be off ever since the time of surgery, particularly with reading and close vision.  Routine follow-up echocardiogram performed July 12, 2019 revealed normal left ventricular function with ejection fraction estimated 55 to 60%.  The mitral valve repair appeared intact with no residual mitral regurgitation.  Mean transvalvular gradient across the valve was estimated at 6.0 mmHg.  The patient was seen in follow-up by Dr. Bettina Burnett on July 23, 2019 and last month he had an event monitor placed to assess whether or not he might be having any subclinical episodes of atrial fibrillation.  Results of this test remain pending at this time.  Patient reports that since his last office visit he has done very well.  His eyesight has now returned to normal.  He does not have palpitations.  He does not have exertional shortness of breath.  He does have some vague feelings across his chest that seem to be positional, particular if he lays on his left side.  He also states that he can feel his heart pounding sometimes.   Current Outpatient Medications  Medication Sig Dispense Refill  . albuterol (PROVENTIL HFA;VENTOLIN HFA) 108 (90  Base) MCG/ACT inhaler Inhale 1-2 puffs into the lungs every 6 (six) hours as needed for wheezing or shortness of breath.     Marland Kitchen amoxicillin (AMOXIL) 500 MG capsule Take 4 capsules 45 minutes prior to dental procedures. 20 capsule 0  . esomeprazole (NEXIUM) 40 MG capsule Take 40 mg by mouth at bedtime.     . finasteride (PROSCAR) 5 MG tablet Take 5 mg by mouth at bedtime.     . fluorouracil (EFUDEX) 5 % cream Apply 1 application topically 2 (two) times daily as needed (skin spots).    . fluticasone (FLONASE) 50 MCG/ACT nasal spray Place 1 spray into both nostrils daily as needed for allergies or rhinitis.     . fluticasone-salmeterol (ADVAIR HFA) 230-21 MCG/ACT inhaler Inhale 2 puffs into the lungs 2 (two) times daily as needed (shortness of breath).    . Ketotifen Fumarate (ALLERGY EYE DROPS OP) Place 1 drop into both eyes daily as needed (allergies).    . metFORMIN (GLUCOPHAGE-XR) 500 MG 24 hr tablet Take 500 mg by mouth daily with breakfast. Pt unsure of Metformin dose, but takes 1 XR tab daily    . metoprolol tartrate (LOPRESSOR) 25 MG tablet Take 0.5 tablets (12.5 mg total) by mouth 2 (two) times daily. 60 tablet 11  . montelukast (SINGULAIR) 10 MG tablet Take 10 mg by mouth daily.     . silver sulfADIAZINE (SILVADENE) 1 % cream Apply 1 application topically daily  as needed (eczema).    . simvastatin (ZOCOR) 40 MG tablet Take 40 mg by mouth at bedtime.     . triamcinolone cream (KENALOG) 0.1 % Apply 1 application topically daily as needed (eczema). Mixed with eucerin    . warfarin (COUMADIN) 2.5 MG tablet TAKE 2 TABLETS BY MOUTH DAILY 60 tablet 2   No current facility-administered medications for this visit.      Physical Exam:   BP 103/69 (BP Location: Right Arm, Patient Position: Sitting, Cuff Size: Normal)   Pulse 86   Temp 97.7 F (36.5 C) (Skin)   Resp 20   Ht 6\' 1"  (1.854 m)   Wt 174 lb (78.9 kg)   SpO2 97% Comment: RA  BMI 22.96 kg/m    General:  Well-appearing  Chest:   Clear to auscultation  CV:   Regular rate and rhythm without murmur  Incisions:  Completely healed  Abdomen:  Soft nontender  Extremities:  Warm and well-perfused  Diagnostic Tests:   ECHOCARDIOGRAM REPORT       Patient Name:   Alejandro Burnett Date of Exam: 07/12/2019 Medical Rec #:  07/14/2019        Height:       73.0 in Accession #:    [id number]       Weight:       177.8 lb Date of Birth:  07-13-1951        BSA:          2.05 m Patient Age:    68 years         BP:           100/70 mmHg Patient Gender: M                HR:           86 bpm. Exam Location:  [facility]  Procedure: 2D Echo  Indications:    Mitral Valve Disorder 424.0 / I05.9   History:        Patient has prior history of Echocardiogram examinations, most                 recent 03/07/2019. Mitral Valve Prolapse, Mitral Valve Disease                 and S/P minimally invasive mitral valve repair + maze procedure,                 Arrythmias:Atrial Fibrillation, Signs/Symptoms:Chest Pain and                 Mitral regurgitation; Risk Factors:Dyslipidemia.   Sonographer:    03/09/2019 Referring Phys: [id number] Alejandro Burnett  IMPRESSIONS    1. Left ventricular ejection fraction, by visual estimation, is 55 to 60%. The left ventricle has normal function. There is no left ventricular hypertrophy.  2. Left ventricular diastolic parameters are indeterminate.  3. Global right ventricle has normal systolic function.The right ventricular size is normal. No increase in right ventricular wall thickness.  4. Left atrial size was mildly dilated.  5. Right atrial size was normal.  6. The mitral valve has been repaired with mitral annular ring well seated. The mean gradient is 6 mmHg, Peak velocity is 1.71 m/s, MV PHT Alejandro Burnett - no evidence of possible stenosis. No evidence of mitral valve regurgitation.  7. The tricuspid valve is normal in structure. Tricuspid valve regurgitation  mild-moderate.  8. The aortic valve is normal in structure. Aortic valve regurgitation is not visualized.  No evidence of aortic valve sclerosis or stenosis.  9. The pulmonic valve was normal in structure. Pulmonic valve regurgitation is not visualized. 10. Normal pulmonary artery systolic pressure.  FINDINGS  Left Ventricle: Left ventricular ejection fraction, by visual estimation, is 55 to 60%. The left ventricle has normal function. There is no left ventricular hypertrophy. Left ventricular diastolic parameters are indeterminate. Normal left atrial  pressure.  Right Ventricle: The right ventricular size is normal. No increase in right ventricular wall thickness. Global RV systolic function is has normal systolic function. The tricuspid regurgitant velocity is 2.43 m/s, and with an assumed right atrial pressure  of 3 mmHg, the estimated right ventricular systolic pressure is normal at 26.6 mmHg.  Left Atrium: Left atrial size was mildly dilated.  Right Atrium: Right atrial size was normal in size  Pericardium: There is no evidence of pericardial effusion.  Mitral Valve: The mitral valve has been repaired/replaced. No evidence of mitral valve stenosis by observation. MV peak gradient, 11.7 mmHg. No evidence of mitral valve regurgitation.  Tricuspid Valve: The tricuspid valve is normal in structure. Tricuspid valve regurgitation mild-moderate.  Aortic Valve: The aortic valve is normal in structure. Aortic valve regurgitation is not visualized. The aortic valve is structurally normal, with no evidence of sclerosis or stenosis.  Pulmonic Valve: The pulmonic valve was normal in structure. Pulmonic valve regurgitation is not visualized.  Aorta: The aortic root, ascending aorta and aortic arch are all structurally normal, with no evidence of dilitation or obstruction.  Venous: The inferior vena cava is normal in size with greater than 50% respiratory variability, suggesting right  atrial pressure of 3 mmHg.  IAS/Shunts: No atrial level shunt detected by color flow Doppler. No ventricular septal defect is seen or detected. There is no evidence of an atrial septal defect.     LEFT VENTRICLE PLAX 2D LVIDd:         4.70 cm       Diastology LVIDs:         3.15 cm       LV e' lateral:   6.31 cm/s LV PW:         0.90 cm       LV E/e' lateral: 23.0 LV IVS:        1.00 cm       LV e' medial:    4.13 cm/s LV SV:         63 ml         LV E/e' medial:  35.1 LV SV Index:   30.87                              2D Longitudinal Strain                              2D Strain GLS Avg:     -8.8 % LV Volumes (MOD) LV area d, A2C:    27.80 cm LV area d, A4C:    29.50 cm LV area s, A2C:    19.00 cm LV area s, A4C:    19.90 cm LV major d, A2C:   8.63 cm LV major d, A4C:   8.67 cm LV major s, A2C:   7.99 cm LV major s, A4C:   8.14 cm LV vol d, MOD A2C: 74.4 ml LV vol d, MOD A4C: 88.1 ml LV vol s, MOD A2C: 39.8  ml LV vol s, MOD A4C: 42.8 ml LV SV MOD A2C:     34.6 ml LV SV MOD A4C:     88.1 ml LV SV MOD BP:      38.7 ml  RIGHT VENTRICLE            IVC RV S prime:     7.94 cm/s  IVC diam: 1.50 cm TAPSE (M-mode): 1.8 cm  LEFT ATRIUM             Index       RIGHT ATRIUM           Index LA diam:        4.70 cm 2.30 cm/m  RA Area:     21.00 cm LA Vol (A2C):   83.5 ml 40.80 ml/m RA Volume:   63.60 ml  31.07 ml/m LA Vol (A4C):   75.9 ml 37.08 ml/m LA Biplane Vol: 84.6 ml 41.33 ml/m  AORTIC VALVE LVOT Vmax:   113.00 cm/s LVOT Vmean:  84.500 cm/s LVOT VTI:    0.200 m   AORTA Ao Root diam: 3.80 cm Ao Asc diam:  3.80 cm  MITRAL VALVE                        TRICUSPID VALVE MV Area (PHT): 1.93 cm             TR Peak grad:   23.6 mmHg MV Peak grad:  11.7 mmHg            TR Vmax:        259.00 cm/s MV Mean grad:  6.0 mmHg MV Vmax:       1.71 m/s             SHUNTS MV Vmean:      114.0 cm/s           Systemic VTI: 0.20 m MV VTI:        0.48 m MV PHT:         114.00 msec MV E velocity: 145.00 cm/s 103 cm/s    Alejandro Burnett Electronically signed by Alejandro Burnett Signature Date/Time: 07/12/2019/5:48:54 PM     2 channel telemetry rhythm strip demonstrates normal sinus rhythm   Impression:  Patient is doing well and maintaining sinus rhythm approximately 72-month status post minimally invasive mitral valve repair and Maze procedure.  Plan:  We have not recommended any change the patient's current medications.  I discussed the need to follow-up on results of the patient's recent outpatient event monitor test before making an assessment about whether or not it would be reasonable to stop warfarin anticoagulation.  Patient will follow up about this with Dr. Dulce Sellar and return to our office for routine follow-up next summer, approximately 1 year following his surgery.  All of his questions been addressed.   I spent in excess of 15 minutes during the conduct of this office consultation and >50% of this time involved direct face-to-face encounter with the patient for counseling and/or coordination of their care.    Alejandro Decent. Cornelius Moras, MD 09/03/2019 2:26 PM

## 2019-09-03 NOTE — Patient Instructions (Signed)
Continue all previous medications without any changes at this time  

## 2019-09-04 ENCOUNTER — Ambulatory Visit (INDEPENDENT_AMBULATORY_CARE_PROVIDER_SITE_OTHER): Payer: Medicare Other | Admitting: *Deleted

## 2019-09-04 DIAGNOSIS — Z5181 Encounter for therapeutic drug level monitoring: Secondary | ICD-10-CM | POA: Diagnosis not present

## 2019-09-04 DIAGNOSIS — I4891 Unspecified atrial fibrillation: Secondary | ICD-10-CM

## 2019-09-04 DIAGNOSIS — Z8679 Personal history of other diseases of the circulatory system: Secondary | ICD-10-CM

## 2019-09-04 DIAGNOSIS — Z9889 Other specified postprocedural states: Secondary | ICD-10-CM | POA: Diagnosis not present

## 2019-09-04 LAB — POCT INR: INR: 1.9 — AB (ref 2.0–3.0)

## 2019-09-04 MED ORDER — AMOXICILLIN 500 MG PO CAPS: ORAL_CAPSULE | ORAL | 0 refills | Status: DC

## 2019-09-04 NOTE — Patient Instructions (Signed)
Take warfarin 3 tablet tonight then resume 2 tablets daily except 1 tablet on Mondays. Amoxicillin is fine with warfarin.Recheck INR in 6 weeks. Call with any new medications or procedures: Coumadin Clinic [phone] Main [phone]

## 2019-09-06 ENCOUNTER — Telehealth: Payer: Self-pay | Admitting: Emergency Medicine

## 2019-09-06 DIAGNOSIS — I493 Ventricular premature depolarization: Secondary | ICD-10-CM

## 2019-09-06 NOTE — Telephone Encounter (Signed)
Pt aware he can just stop taking warfarin. Med list updated, future INR appt canceled.

## 2019-09-06 NOTE — Telephone Encounter (Signed)
Called patient. Informed him of his monitor results and per Dr. Dulce Sellar that he doesn't have to continue his anticoagulant because no atrial fibrillation was noted. Informed patient he also needs to see ep and we will send referral for that and call him with the appointment. Patient was also instructed to wait to hear from coumadin clinic for them to instruct him to come off coumadin. Will copy them on this message. No further questions at this time.

## 2019-10-29 ENCOUNTER — Ambulatory Visit (INDEPENDENT_AMBULATORY_CARE_PROVIDER_SITE_OTHER): Payer: Medicare Other | Admitting: Cardiology

## 2019-10-29 ENCOUNTER — Other Ambulatory Visit: Payer: Self-pay

## 2019-10-29 ENCOUNTER — Encounter: Payer: Self-pay | Admitting: Cardiology

## 2019-10-29 VITALS — BP 102/74 | HR 84 | Ht 73.0 in | Wt 182.4 lb

## 2019-10-29 DIAGNOSIS — I472 Ventricular tachycardia, unspecified: Secondary | ICD-10-CM

## 2019-10-29 NOTE — Progress Notes (Signed)
Electrophysiology Office Note   Date:  10/29/2019   ID:  Alejandro Burnett 1950-12-15, MRN [id number]  PCP:  Alejandro Burnett., MD  Cardiologist: Alejandro Burnett Primary Electrophysiologist:  Alejandro Cacioppo Jorja Loa, MD    Chief Complaint: PVCs   History of Present Illness: Alejandro Burnett is a 69 y.o. male who is being seen today for the evaluation of PVCs at the request of Alejandro Burnett, Alejandro Oven, MD. Presenting today for electrophysiology evaluation.  He has a history significant for mitral regurgitation status post mitral valve repair with maze procedure 03/07/2019, paroxysmal atrial fibrillation, hyperlipidemia.  He wore a cardiac monitor to further evaluate his atrial fibrillation burden status post maze.  Monitor showed ventricular ectopy with nonsustained VT.  Fortunately, he was having rare PVCs less than 1%.  He had one run of VT lasting 14 beats.  Today, he denies symptoms of palpitations, chest pain, orthopnea, PND, lower extremity edema, claudication, dizziness, presyncope, syncope, bleeding, or neurologic sequela. The patient is tolerating medications without difficulties.  He is unaware of palpitations.  His main complaint today is fatigue and shortness of breath.  He states that prior to his mitral valve surgery, he had very minimal restrictions.  Since that time, he has had quite a bit of fatigue and shortness of breath.  He is able to do most of his daily activities, but at the end of the day, where he used to be able to continue to work till sundown in the summer, he has to stop multiple hours before that.  He also gets short of breath when walking up a hill.  This did not occur prior to his mitral valve operation.  Past Medical History:  Diagnosis Date  . Asthma   . Benign prostatic hyperplasia with urinary obstruction   . BPH (benign prostatic hyperplasia)   . Dyslipidemia   . Early cataracts, bilateral   . Gastroesophageal reflux disease   . Hematuria 04/17/2014   Microscopic  .  Hyperlipemia   . Migraine headache   . Migraines   . Mitral regurgitation   . Mitral valve prolapse   . Nephrolithiasis 11/04/2015  . Non-rheumatic mitral regurgitation   . Normal coronary arteries   . Paroxysmal atrial fibrillation (HCC)   . Pre-diabetes   . S/P minimally invasive maze operation for atrial fibrillation 03/07/2019   Complete bilateral atrial lesion set using cryothermy and bipolar radiofrequency ablation with clipping of LA appendage via right mini thoracotomy approach  . S/P minimally invasive mitral valve repair 03/07/2019   Complex valvuloplasty including triangular resection of middle scallop of posterior leaflet and 34 mm Sorin Memo 4D ring annuloplasty via right mini thoracotomy approach   Past Surgical History:  Procedure Laterality Date  . APPENDECTOMY    . BACK SURGERY    . COLONOSCOPY    . MINIMALLY INVASIVE MAZE PROCEDURE N/A 03/07/2019   Procedure: MINIMALLY INVASIVE MAZE PROCEDURE;  Surgeon: Alejandro Nails, MD;  Location: [facility] OR;  Service: Open Heart Surgery;  Laterality: N/A;  . MITRAL VALVE REPAIR Right 03/07/2019   Procedure: MINIMALLY INVASIVE MITRAL VALVE REPAIR (MVR) USING MEMO 4D SIZE 34;  Surgeon: Alejandro Nails, MD;  Location: [facility] OR;  Service: Open Heart Surgery;  Laterality: Right;  . RIGHT/LEFT HEART CATH AND CORONARY ANGIOGRAPHY N/A 10/09/2018   Procedure: RIGHT/LEFT HEART CATH AND CORONARY ANGIOGRAPHY;  Surgeon: Alejandro Bollman, MD;  Location: [facility] INVASIVE CV LAB;  Service: Cardiovascular;  Laterality: N/A;  . TEE WITHOUT CARDIOVERSION N/A 08/24/2018  Procedure: TRANSESOPHAGEAL ECHOCARDIOGRAM (TEE);  Surgeon: Alejandro Fair, MD;  Location: [facility] ENDOSCOPY;  Service: Cardiovascular;  Laterality: N/A;  . TEE WITHOUT CARDIOVERSION N/A 03/07/2019   Procedure: TRANSESOPHAGEAL ECHOCARDIOGRAM (TEE);  Surgeon: Alejandro Nails, MD;  Location: [facility] OR;  Service: Open Heart Surgery;  Laterality: N/A;  . TONSILLECTOMY       Current Outpatient Medications    Medication Sig Dispense Refill  . albuterol (PROVENTIL HFA;VENTOLIN HFA) 108 (90 Base) MCG/ACT inhaler Inhale 1-2 puffs into the lungs every 6 (six) hours as needed for wheezing or shortness of breath.     Marland Kitchen amoxicillin (AMOXIL) 500 MG capsule Take 4 capsules 45 minutes prior to dental procedures. 20 capsule 0  . esomeprazole (NEXIUM) 40 MG capsule Take 40 mg by mouth at bedtime.     . finasteride (PROSCAR) 5 MG tablet Take 5 mg by mouth at bedtime.     . fluorouracil (EFUDEX) 5 % cream Apply 1 application topically 2 (two) times daily as needed (skin spots).    . fluticasone (FLONASE) 50 MCG/ACT nasal spray Place 1 spray into both nostrils daily as needed for allergies or rhinitis.     . fluticasone-salmeterol (ADVAIR HFA) 230-21 MCG/ACT inhaler Inhale 2 puffs into the lungs 2 (two) times daily as needed (shortness of breath).    . Ketotifen Fumarate (ALLERGY EYE DROPS OP) Place 1 drop into both eyes daily as needed (allergies).    . metFORMIN (GLUCOPHAGE-XR) 500 MG 24 hr tablet Take 500 mg by mouth daily with breakfast. Pt unsure of Metformin dose, but takes 1 XR tab daily    . montelukast (SINGULAIR) 10 MG tablet Take 10 mg by mouth daily.     . silver sulfADIAZINE (SILVADENE) 1 % cream Apply 1 application topically daily as needed (eczema).    . simvastatin (ZOCOR) 40 MG tablet Take 40 mg by mouth at bedtime.     . triamcinolone cream (KENALOG) 0.1 % Apply 1 application topically daily as needed (eczema). Mixed with eucerin    . metoprolol tartrate (LOPRESSOR) 25 MG tablet Take 0.5 tablets (12.5 mg total) by mouth 2 (two) times daily. 60 tablet 11   No current facility-administered medications for this visit.    Allergies:   Apple, Banana, Peanut-containing drug products, Strawberry (diagnostic), and Tamsulosin   Social History:  The patient  reports that he quit smoking about 35 years ago. His smoking use included cigarettes. He smoked 0.50 packs per day. He has quit using smokeless  tobacco.  His smokeless tobacco use included chew. He reports that he does not drink alcohol or use drugs.   Family History:  The patient's family history includes Cancer in his father; Hypertension in his mother.    ROS:  Please see the history of present illness.   Otherwise, review of systems is positive for none.   All other systems are reviewed and negative.    PHYSICAL EXAM: VS:  BP 102/74   Pulse 84   Ht 6\' 1"  (1.854 m)   Wt 182 lb 6.4 oz (82.7 kg)   SpO2 95%   BMI 24.06 kg/m  , BMI Body mass index is 24.06 kg/m. GEN: Well nourished, well developed, in no acute distress  HEENT: normal  Neck: no JVD, carotid bruits, or masses Cardiac: RRR; systolic murmur, no rubs, or gallops,no edema  Respiratory:  clear to auscultation bilaterally, normal work of breathing GI: soft, nontender, nondistended, + BS MS: no deformity or atrophy  Skin: warm and dry Neuro:  Strength  and sensation are intact Psych: euthymic mood, full affect  EKG:  EKG is ordered today. Personal review of the ekg ordered shows sinus rhythm, rate 81  Recent Labs: 03/05/2019: ALT 10 03/08/2019: Magnesium 2.5 03/13/2019: BUN 18; Creatinine, Ser 0.84; Hemoglobin 10.1; Platelets 127; Potassium 3.8; Sodium 136    Lipid Panel  No results found for: CHOL, TRIG, HDL, CHOLHDL, VLDL, LDLCALC, LDLDIRECT   Wt Readings from Last 3 Encounters:  10/29/19 182 lb 6.4 oz (82.7 kg)  09/03/19 174 lb (78.9 kg)  07/23/19 183 lb 9.6 oz (83.3 kg)      Other studies Reviewed: Additional studies/ records that were reviewed today include: TTE 07/12/2019 Review of the above records today demonstrates:  1. Left ventricular ejection fraction, by visual estimation, is 55 to  60%. The left ventricle has normal function. There is no left ventricular  hypertrophy.  2. Left ventricular diastolic parameters are indeterminate.  3. Global right ventricle has normal systolic function.The right  ventricular size is normal. No increase  in right ventricular wall  thickness.  4. Left atrial size was mildly dilated.  5. Right atrial size was normal.  6. The mitral valve has been repaired with mitral annular ring well  seated. The mean gradient is 6 mmHg, Peak velocity is 1.71 m/s, MV PHT  15ms - no evidence of possible stenosis. No evidence of mitral valve  regurgitation.  7. The tricuspid valve is normal in structure. Tricuspid valve  regurgitation mild-moderate.  8. The aortic valve is normal in structure. Aortic valve regurgitation is  not visualized. No evidence of aortic valve sclerosis or stenosis.  9. The pulmonic valve was normal in structure. Pulmonic valve  regurgitation is not visualized.  10. Normal pulmonary artery systolic pressure.   Monitor 09/06/2019 personally reviewed The rhythm throughout was sinus with minimum average and maximum heart rates of 61, 92 and 161 bpm. No pauses of 3 seconds or greater and no episodes of sinus node or AV nodal block. Ventricular ectopy overall was rare however there were 54 couplets 2 triplets and 10 runs of PVCs the longest an episode of nonsustained ventricular tachycardia 14 complexes at a rate of 134 bpm. Supraventricular ectopy was rare.  There were no episodes of atrial fibrillation or flutter.  There were 11 brief runs of atrial tachycardia the longest and complexes at a rate of 113 bpm. There were 2 triggered events unassociated with arrhythmia    ASSESSMENT AND PLAN:  1.  Nonsustained VT: Fortunately patient has had an echo that shows a normal ejection fraction with a stable mitral valve repair.  The patient was asymptomatic during this episode, likely sleeping as it was nocturnal.  As his ejection fraction is normal, I do not feel that this is a life-threatening arrhythmia.  No medication changes at this time.  2.  Mitral valve regurgitation: Status post mitral valve repair.  Functioning normally on the most recent echo.  3.  Paroxysmal atrial  fibrillation: Status post maze at the time of his mitral valve repair.  He is currently not anticoagulated.  CHA2DS2-VASc of 1.  Case discussed with referring cardiologist  Current medicines are reviewed at length with the patient today.   The patient does not have concerns regarding his medicines.  The following changes were made today:  none  Labs/ tests ordered today include:  Orders Placed This Encounter  Procedures  . EKG 12-Lead     Disposition:   FU with Yazir Koerber as needed  Signed, Jaimin Krupka Hassell Done  Lasalle Abee, MD  10/29/2019 11:05 AM     [facility] [address] [place] [address] [phone] (office) [phone] (fax)

## 2019-10-29 NOTE — Patient Instructions (Signed)
Medication Instructions:  Your physician recommends that you continue on your current medications as directed. Please refer to the Current Medication list given to you today.  *If you need a refill on your cardiac medications before your next appointment, please call your pharmacy*   Lab Work: None ordered If you have labs (blood work) drawn today and your tests are completely normal, you will receive your results only by: Marland Kitchen MyChart Message (if you have MyChart) OR . A paper copy in the mail If you have any lab test that is abnormal or we need to change your treatment, we will call you to review the results.   Testing/Procedures: None ordered   Follow-Up: At [facility], you and your health needs are our priority.  As part of our continuing mission to provide you with exceptional heart care, we have created designated Provider Care Teams.  These Care Teams include your primary Cardiologist (physician) and Advanced Practice Providers (APPs -  Physician Assistants and Nurse Practitioners) who all work together to provide you with the care you need, when you need it.  We recommend signing up for the patient portal called "MyChart".  Sign up information is provided on this After Visit Summary.  MyChart is used to connect with patients for Virtual Visits (Telemedicine).  Patients are able to view lab/test results, encounter notes, upcoming appointments, etc.  Non-urgent messages can be sent to your provider as well.   To learn more about what you can do with MyChart, go to [email or web address].    Your next appointment:   As needed with Dr. Elberta Fortis.

## 2020-01-22 ENCOUNTER — Other Ambulatory Visit: Payer: Self-pay | Admitting: Cardiovascular Disease

## 2020-01-23 NOTE — Telephone Encounter (Signed)
This is Dr. Hulen Shouts pt. This medication was prescribed in the hospital. Please address

## 2020-02-15 ENCOUNTER — Encounter: Payer: Self-pay | Admitting: Cardiology

## 2020-02-15 ENCOUNTER — Ambulatory Visit (INDEPENDENT_AMBULATORY_CARE_PROVIDER_SITE_OTHER): Payer: Medicare Other | Admitting: Cardiology

## 2020-02-15 ENCOUNTER — Other Ambulatory Visit: Payer: Self-pay

## 2020-02-15 VITALS — BP 104/60 | HR 90 | Ht 73.0 in | Wt 188.0 lb

## 2020-02-15 DIAGNOSIS — I480 Paroxysmal atrial fibrillation: Secondary | ICD-10-CM

## 2020-02-15 DIAGNOSIS — E785 Hyperlipidemia, unspecified: Secondary | ICD-10-CM

## 2020-02-15 DIAGNOSIS — Z9889 Other specified postprocedural states: Secondary | ICD-10-CM | POA: Diagnosis not present

## 2020-02-15 DIAGNOSIS — I493 Ventricular premature depolarization: Secondary | ICD-10-CM | POA: Diagnosis not present

## 2020-02-15 DIAGNOSIS — Z8679 Personal history of other diseases of the circulatory system: Secondary | ICD-10-CM

## 2020-02-15 MED ORDER — METOPROLOL TARTRATE 25 MG PO TABS: 12.5000 mg | ORAL_TABLET | Freq: Every day | ORAL | 1 refills | Status: DC | PRN

## 2020-02-15 NOTE — Patient Instructions (Signed)
Medication Instructions:  Your physician has recommended you make the following change in your medication:  CHANGE: Lopressor 12.5 mg take one tablet by mouth daily as needed for rapid heart rate.  *If you need a refill on your cardiac medications before your next appointment, please call your pharmacy*   Lab Work: Your physician recommends that you return for lab work in: TODAY CBC, TSH, CMP, Lipids If you have labs (blood work) drawn today and your tests are completely normal, you will receive your results only by: Marland Kitchen MyChart Message (if you have MyChart) OR . A paper copy in the mail If you have any lab test that is abnormal or we need to change your treatment, we will call you to review the results.   Testing/Procedures: None   Follow-Up: At [facility], you and your health needs are our priority.  As part of our continuing mission to provide you with exceptional heart care, we have created designated Provider Care Teams.  These Care Teams include your primary Cardiologist (physician) and Advanced Practice Providers (APPs -  Physician Assistants and Nurse Practitioners) who all work together to provide you with the care you need, when you need it.  We recommend signing up for the patient portal called "MyChart".  Sign up information is provided on this After Visit Summary.  MyChart is used to connect with patients for Virtual Visits (Telemedicine).  Patients are able to view lab/test results, encounter notes, upcoming appointments, etc.  Non-urgent messages can be sent to your provider as well.   To learn more about what you can do with MyChart, go to [email or web address].    Your next appointment:   6 month(s)  The format for your next appointment:   In Person  Provider:   Norman Herrlich, MD   Other Instructions

## 2020-02-15 NOTE — Progress Notes (Signed)
Cardiology Office Note:    Date:  02/15/2020   ID:  Alejandro Burnett, DOB 1951-05-13, MRN [id number]  PCP:  [name], MD  Cardiologist:  Norman Herrlich, MD    Referring MD: [name], MD    ASSESSMENT:    1. PAF (paroxysmal atrial fibrillation) (HCC)   2. S/P Maze operation for atrial fibrillation   3. S/P mitral valve repair   4. PVC (premature ventricular contraction)   5. Dyslipidemia    PLAN:    In order of problems listed above:  1. Objectively is made a very good response from his mitral valve surgery maintaining sinus rhythm after Maze procedure complaining of symptoms and may well be seen as side effects discontinue his beta-blocker check labs for thyroid hemoglobin and he has plans to follow-up with his cardiothoracic surgeons. 2. Is been seen for arrhythmia by EP no antiarrhythmic drug therapy is recommended 3. Continue with statin check labs including liver function lipid profile regarding toxicity   Next appointment: 6 months   Medication Adjustments/Labs and Tests Ordered: Current medicines are reviewed at length with the patient today.  Concerns regarding medicines are outlined above.  Orders Placed This Encounter  Procedures  . TSH  . CBC  . Comprehensive metabolic panel  . Lipid panel   Meds ordered this encounter  Medications  . metoprolol tartrate (LOPRESSOR) 25 MG tablet    Sig: Take 0.5 tablets (12.5 mg total) by mouth daily as needed.    Dispense:  30 tablet    Refill:  1    Chief Complaint  Patient presents with  . Follow-up    With mitral valve repair for severe mitral regurgitation 03/07/2019  . Atrial Fibrillation    Post maze procedure    History of Present Illness:    Alejandro Burnett is a 69 y.o. male with a hx of  MR secondary to MVP, PAF and Minimally Invasive Mitral valve repair and MAZE procedure on 03/07/2019.  He was maintaining sinus rhythm after Maze procedure and had nonsustained VT on the monitor is not felt to  require antiarrhythmic drugs in EP consultation.  EKG 10/30/2019 done by EP personally reviewed sinus rhythm normal and was normal. He was last seen 07/23/2019. Compliance with diet, lifestyle and medications: Yes  Echocardiogram 07/12/2019 showed normal left ventricular function mild left atrial enlargement that is post mitral valve repair with no regurgitation and no stenosis and mild to moderate tricuspid regurgitation.  He is objectively improved he says he is worse his real complaint is malaise and fatigue and he takes a beta-blocker.  Although the dose is small is a dose independent CNS effect and I told him that we should stop the beta-blocker he is concerned about recurrent atrial fibrillation I told him he can take it as needed.  He is not having orthopnea edema chest pain palpitation or syncope he just feels weak and has difficulty doing heavy physical labor.  He is overdue on a check labs including thyroid and a CBC. Past Medical History:  Diagnosis Date  . Asthma   . Benign prostatic hyperplasia with urinary obstruction   . BPH (benign prostatic hyperplasia)   . Dyslipidemia   . Early cataracts, bilateral   . Gastroesophageal reflux disease   . Hematuria 04/17/2014   Microscopic  . Hyperlipemia   . Migraine headache   . Migraines   . Mitral regurgitation   . Mitral valve prolapse   . Nephrolithiasis 11/04/2015  . Non-rheumatic mitral  regurgitation   . Normal coronary arteries   . Paroxysmal atrial fibrillation (HCC)   . Pre-diabetes   . S/P minimally invasive maze operation for atrial fibrillation 03/07/2019   Complete bilateral atrial lesion set using cryothermy and bipolar radiofrequency ablation with clipping of LA appendage via right mini thoracotomy approach  . S/P minimally invasive mitral valve repair 03/07/2019   Complex valvuloplasty including triangular resection of middle scallop of posterior leaflet and 34 mm Sorin Memo 4D ring annuloplasty via right mini thoracotomy  approach    Past Surgical History:  Procedure Laterality Date  . APPENDECTOMY    . BACK SURGERY    . COLONOSCOPY    . MINIMALLY INVASIVE MAZE PROCEDURE N/A 03/07/2019   Procedure: MINIMALLY INVASIVE MAZE PROCEDURE;  Surgeon: Purcell Nails, MD;  Location: [facility] OR;  Service: Open Heart Surgery;  Laterality: N/A;  . MITRAL VALVE REPAIR Right 03/07/2019   Procedure: MINIMALLY INVASIVE MITRAL VALVE REPAIR (MVR) USING MEMO 4D SIZE 34;  Surgeon: Purcell Nails, MD;  Location: [facility] OR;  Service: Open Heart Surgery;  Laterality: Right;  . RIGHT/LEFT HEART CATH AND CORONARY ANGIOGRAPHY N/A 10/09/2018   Procedure: RIGHT/LEFT HEART CATH AND CORONARY ANGIOGRAPHY;  Surgeon: Tonny Bollman, MD;  Location[facility] INVASIVE CV LAB;  Service: Cardiovascular;  Laterality: N/A;  . TEE WITHOUT CARDIOVERSION N/A 08/24/2018   Procedure: TRANSESOPHAGEAL ECHOCARDIOGRAM (TEE);  Surgeon: Thurmon Fair, MD;  Location: [facility] ENDOSCOPY;  Service: Cardiovascular;  Laterality: N/A;  . TEE WITHOUT CARDIOVERSION N/A 03/07/2019   Procedure: TRANSESOPHAGEAL ECHOCARDIOGRAM (TEE);  Surgeon: Purcell Nails, MD;  Location: [facility] OR;  Service: Open Heart Surgery;  Laterality: N/A;  . TONSILLECTOMY      Current Medications: Current Meds  Medication Sig  . albuterol (PROVENTIL HFA;VENTOLIN HFA) 108 (90 Base) MCG/ACT inhaler Inhale 1-2 puffs into the lungs every 6 (six) hours as needed for wheezing or shortness of breath.   Marland Kitchen amoxicillin (AMOXIL) 500 MG capsule Take 4 capsules 45 minutes prior to dental procedures.  Marland Kitchen aspirin EC 81 MG tablet Take 81 mg by mouth daily. Swallow whole.  . esomeprazole (NEXIUM) 40 MG capsule Take 40 mg by mouth at bedtime.   . finasteride (PROSCAR) 5 MG tablet Take 5 mg by mouth at bedtime.   . fluorouracil (EFUDEX) 5 % cream Apply 1 application topically 2 (two) times daily as needed (skin spots).  . fluticasone (FLONASE) 50 MCG/ACT nasal spray Place 1 spray into both nostrils daily as needed for allergies or  rhinitis.   . fluticasone-salmeterol (ADVAIR HFA) 230-21 MCG/ACT inhaler Inhale 2 puffs into the lungs 2 (two) times daily as needed (shortness of breath).  . metFORMIN (GLUCOPHAGE-XR) 500 MG 24 hr tablet Take 250 mg by mouth daily with breakfast. 0.5 tab daily  . metoprolol tartrate (LOPRESSOR) 25 MG tablet Take 0.5 tablets (12.5 mg total) by mouth daily as needed.  . montelukast (SINGULAIR) 10 MG tablet Take 10 mg by mouth daily.   Marland Kitchen neomycin-polymyxin-hydrocortisone (CORTISPORIN) 3.5-10000-1 OTIC suspension as needed. Ear pain  . simvastatin (ZOCOR) 40 MG tablet Take 40 mg by mouth at bedtime.   . triamcinolone cream (KENALOG) 0.1 % Apply 1 application topically daily as needed (eczema). Mixed with eucerin  . warfarin (COUMADIN) 2.5 MG tablet 0.5 tablets daily.  . [DISCONTINUED] metoprolol tartrate (LOPRESSOR) 25 MG tablet TAKE 1/2 TABLET (12.5 MG TOTAL) BY MOUTH TWICE A DAY.     Allergies:   Apple, Banana, Peanut-containing drug products, Strawberry (diagnostic), and Tamsulosin   Social History  Socioeconomic History  . Marital status: Married    Spouse name: Not on file  . Number of children: Not on file  . Years of education: Not on file  . Highest education level: Not on file  Occupational History  . Not on file  Tobacco Use  . Smoking status: Former Smoker    Packs/day: 0.50    Types: Cigarettes    Quit date: 1986    Years since quitting: 35.5  . Smokeless tobacco: Former Systems developer    Types: Secondary school teacher  . Vaping Use: Never used  Substance and Sexual Activity  . Alcohol use: Never  . Drug use: Never  . Sexual activity: Yes    Birth control/protection: None  Other Topics Concern  . Not on file  Social History Narrative  . Not on file   Social Determinants of Health   Financial Resource Strain:   . Difficulty of Paying Living Expenses:   Food Insecurity:   . Worried About Charity fundraiser in the Last Year:   . Arboriculturist in the Last Year:     Transportation Needs:   . Film/video editor (Medical):   Marland Kitchen Lack of Transportation (Non-Medical):   Physical Activity:   . Days of Exercise per Week:   . Minutes of Exercise per Session:   Stress:   . Feeling of Stress :   Social Connections:   . Frequency of Communication with Friends and Family:   . Frequency of Social Gatherings with Friends and Family:   . Attends Religious Services:   . Active Member of Clubs or Organizations:   . Attends Archivist Meetings:   Marland Kitchen Marital Status:      Family History: The patient's family history includes Cancer in his father; Hypertension in his mother. ROS:   Please see the history of present illness.    All other systems reviewed and are negative.  EKGs/Labs/Other Studies Reviewed:    The following studies were reviewed today:    Recent Labs: 03/05/2019: ALT 10 03/08/2019: Magnesium 2.5 03/13/2019: BUN 18; Creatinine, Ser 0.84; Hemoglobin 10.1; Platelets 127; Potassium 3.8; Sodium 136  Recent Lipid Panel No results found for: CHOL, TRIG, HDL, CHOLHDL, VLDL, LDLCALC, LDLDIRECT  Physical Exam:    VS:  BP 104/60   Pulse 90   Ht 6\' 1"  (1.854 m)   Wt 188 lb (85.3 kg)   SpO2 96%   BMI 24.80 kg/m     Wt Readings from Last 3 Encounters:  02/15/20 188 lb (85.3 kg)  10/29/19 182 lb 6.4 oz (82.7 kg)  09/03/19 174 lb (78.9 kg)     GEN:  Well nourished, well developed in no acute distress HEENT: Normal NECK: No JVD; No carotid bruits LYMPHATICS: No lymphadenopathy CARDIAC: RRR, no murmurs, rubs, gallops RESPIRATORY:  Clear to auscultation without rales, wheezing or rhonchi  ABDOMEN: Soft, non-tender, non-distended MUSCULOSKELETAL:  No edema; No deformity  SKIN: Warm and dry NEUROLOGIC:  Alert and oriented x 3 PSYCHIATRIC:  Normal affect    Signed, Shirlee More, MD  02/15/2020 2:15 PM    [facility]

## 2020-02-16 LAB — CBC
Hematocrit: 45.2 % (ref 37.5–51.0)
Hemoglobin: 14.4 g/dL (ref 13.0–17.7)
MCH: 28.4 pg (ref 26.6–33.0)
MCHC: 31.9 g/dL (ref 31.5–35.7)
MCV: 89 fL (ref 79–97)
Platelets: 174 10*3/uL (ref 150–450)
RBC: 5.07 x10E6/uL (ref 4.14–5.80)
RDW: 13.2 % (ref 11.6–15.4)
WBC: 10.2 10*3/uL (ref 3.4–10.8)

## 2020-02-16 LAB — LIPID PANEL
Chol/HDL Ratio: 3.1 ratio (ref 0.0–5.0)
Cholesterol, Total: 177 mg/dL (ref 100–199)
HDL: 57 mg/dL (ref >39)
LDL Chol Calc (NIH): 108 mg/dL — ABNORMAL HIGH (ref 0–99)
Triglycerides: 65 mg/dL (ref 0–149)
VLDL Cholesterol Cal: 12 mg/dL (ref 5–40)

## 2020-02-16 LAB — COMPREHENSIVE METABOLIC PANEL
ALT: 15 IU/L (ref 0–44)
AST: 15 IU/L (ref 0–40)
Albumin/Globulin Ratio: 2 (ref 1.2–2.2)
Albumin: 4.3 g/dL (ref 3.8–4.8)
Alkaline Phosphatase: 56 IU/L (ref 48–121)
BUN/Creatinine Ratio: 20 (ref 10–24)
BUN: 22 mg/dL (ref 8–27)
Bilirubin Total: 0.5 mg/dL (ref 0.0–1.2)
CO2: 24 mmol/L (ref 20–29)
Calcium: 9.2 mg/dL (ref 8.6–10.2)
Chloride: 99 mmol/L (ref 96–106)
Creatinine, Ser: 1.08 mg/dL (ref 0.76–1.27)
GFR calc Af Amer: 82 mL/min/{1.73_m2} (ref >59)
GFR calc non Af Amer: 71 mL/min/{1.73_m2} (ref >59)
Globulin, Total: 2.2 g/dL (ref 1.5–4.5)
Glucose: 160 mg/dL — ABNORMAL HIGH (ref 65–99)
Potassium: 4.2 mmol/L (ref 3.5–5.2)
Sodium: 135 mmol/L (ref 134–144)
Total Protein: 6.5 g/dL (ref 6.0–8.5)

## 2020-02-16 LAB — TSH: TSH: 0.949 u[IU]/mL (ref 0.450–4.500)

## 2020-02-18 ENCOUNTER — Telehealth: Payer: Self-pay

## 2020-02-18 DIAGNOSIS — D649 Anemia, unspecified: Secondary | ICD-10-CM

## 2020-02-18 NOTE — Telephone Encounter (Signed)
Spoke with patient regarding results and recommendation.  Patient verbalizes understanding and is agreeable to plan of care. Advised patient to call back with any issues or concerns.  

## 2020-02-18 NOTE — Telephone Encounter (Signed)
-----   Message from Baldo Daub, MD sent at 02/16/2020 12:07 PM EDT ----- Normal or stable result  He is mildly anemic which may play a role in his weakness  He should take a multivitamin daily with iron  He needs a follow-up hemoglobin ferritin iron and total iron-binding capacity in about 1 month

## 2020-03-03 ENCOUNTER — Encounter: Payer: Self-pay | Admitting: Thoracic Surgery (Cardiothoracic Vascular Surgery)

## 2020-03-03 ENCOUNTER — Other Ambulatory Visit: Payer: Self-pay

## 2020-03-03 ENCOUNTER — Ambulatory Visit (INDEPENDENT_AMBULATORY_CARE_PROVIDER_SITE_OTHER): Payer: Medicare Other | Admitting: Thoracic Surgery (Cardiothoracic Vascular Surgery)

## 2020-03-03 VITALS — BP 112/73 | HR 80 | Temp 97.9°F | Resp 20 | Ht 73.0 in | Wt 180.0 lb

## 2020-03-03 DIAGNOSIS — Z9889 Other specified postprocedural states: Secondary | ICD-10-CM | POA: Diagnosis not present

## 2020-03-03 DIAGNOSIS — I480 Paroxysmal atrial fibrillation: Secondary | ICD-10-CM

## 2020-03-03 DIAGNOSIS — Z8679 Personal history of other diseases of the circulatory system: Secondary | ICD-10-CM | POA: Diagnosis not present

## 2020-03-03 NOTE — Progress Notes (Signed)
[address]       [name] [address]             [phone]     CARDIOTHORACIC SURGERY OFFICE NOTE  Referring Provider is Baldo Daub, MD PCP is [name], MD   HPI:  Patient is a 69 year old male with history of mitral valve prolapse and recurrent paroxysmal atrial fibrillation who returns to the office today for routine follow-up approximately 1 year status post minimally invasive mitral valve repair and Maze procedure on March 07, 2019.  Routine follow-up echocardiogram performed July 12, 2019 revealed normal left ventricular function with ejection fraction estimated 55 to 60%.  Mitral valve repair appeared intact with no residual mitral regurgitation.  Mean transvalvular gradient across the mitral valve was estimated 6 mmHg at that time.  The patient had an event monitor placed to assess whether or not he might be having subclinical episodes of atrial fibrillation.  This revealed no evidence of AF and Coumadin therapy has been stopped.  He was last seen here in our office on September 03, 2019 at which time he was doing fairly well and maintaining sinus rhythm.  Patient was recently seen in follow-up by Dr. Dulce Sellar and overall continues to do well, although he complains that he still does not feel like his old self and in particular he complains that his exercise tolerance is not what it used to be.  He thinks that perhaps he feels better since he stopped taking metoprolol 2 weeks ago.  He denies any history of palpitations or chest pain.  He has not had significant exertional shortness of breath. He is back at work and is quite active physically.  Within the past year and a half he has lost both his wife and his mother.   Current Outpatient Medications  Medication Sig Dispense Refill  . albuterol (PROVENTIL HFA;VENTOLIN HFA) 108 (90 Base) MCG/ACT inhaler Inhale 1-2 puffs into the lungs every 6 (six) hours as needed for wheezing or shortness of breath.       Marland Kitchen amoxicillin (AMOXIL) 500 MG capsule Take 4 capsules 45 minutes prior to dental procedures. 20 capsule 0  . aspirin EC 81 MG tablet Take 81 mg by mouth daily. Swallow whole.    . esomeprazole (NEXIUM) 40 MG capsule Take 40 mg by mouth at bedtime.     . finasteride (PROSCAR) 5 MG tablet Take 5 mg by mouth at bedtime.     . fluorouracil (EFUDEX) 5 % cream Apply 1 application topically 2 (two) times daily as needed (skin spots).    . fluticasone (FLONASE) 50 MCG/ACT nasal spray Place 1 spray into both nostrils daily as needed for allergies or rhinitis.     . fluticasone-salmeterol (ADVAIR HFA) 230-21 MCG/ACT inhaler Inhale 2 puffs into the lungs 2 (two) times daily as needed (shortness of breath).    . metFORMIN (GLUCOPHAGE-XR) 500 MG 24 hr tablet Take 250 mg by mouth daily with breakfast. 0.5 tab daily    . metoprolol tartrate (LOPRESSOR) 25 MG tablet Take 0.5 tablets (12.5 mg total) by mouth daily as needed. 30 tablet 1  . montelukast (SINGULAIR) 10 MG tablet Take 10 mg by mouth daily.     Marland Kitchen neomycin-polymyxin-hydrocortisone (CORTISPORIN) 3.5-10000-1 OTIC suspension as needed. Ear pain    . simvastatin (ZOCOR) 40 MG tablet Take 40 mg by mouth at bedtime.     . triamcinolone cream (KENALOG) 0.1 % Apply 1 application topically daily as needed (  eczema). Mixed with eucerin    . warfarin (COUMADIN) 2.5 MG tablet 0.5 tablets daily.     No current facility-administered medications for this visit.      Physical Exam:   BP 112/73   Pulse 80   Temp 97.9 F (36.6 C) (Skin)   Resp 20   Ht 6\' 1"  (1.854 m)   Wt 180 lb (81.6 kg)   SpO2 95% Comment: RA  BMI 23.75 kg/m   General:  Well-appearing  Chest:   Clear to auscultatation  CV:   Regular rate and rhythm without murmur  Incisions:  Completely healed  Abdomen:  Soft nontender  Extremities:  Warm and well-perfused  Diagnostic Tests:  n/a   Impression:  Patient is doing well and maintaining sinus rhythm approximately 1 year status  post minimally invasive mitral valve repair and Maze procedure  Plan:  We have not recommended any change the patient's current medications.  I have encouraged the patient to increase his physical activity without any particular limitations.  He will continue to follow-up with Dr. and return to our office in the future only should specific problems or questions arise.  I spent in excess of 15 minutes during the conduct of this office consultation and >50% of this time involved direct face-to-face encounter with the patient for counseling and/or coordination of their care.    Dulce Sellar. Salvatore Decent, MD 03/03/2020 2:27 PM

## 2020-03-03 NOTE — Patient Instructions (Addendum)

## 2020-03-20 LAB — FERRITIN: Ferritin: 44 ng/mL (ref 30–400)

## 2020-03-20 LAB — IRON AND TIBC
Iron Saturation: 21 % (ref 15–55)
Iron: 64 ug/dL (ref 38–169)
Total Iron Binding Capacity: 309 ug/dL (ref 250–450)
UIBC: 245 ug/dL (ref 111–343)

## 2020-04-07 ENCOUNTER — Telehealth: Payer: Self-pay

## 2020-04-07 NOTE — Telephone Encounter (Signed)
-----   Message from Pearletha Furl, RPH-CPP sent at 04/04/2020  4:53 PM EDT ----- This patient was last seen @ [facility] in January but I don't see any telephone call trying to re-schedule patient.   Please follow up  Thanks, Raquel

## 2020-04-07 NOTE — Telephone Encounter (Signed)
Called and spoke w/pt regarding the reason as to why they stopped warfarin but after further digging I saw it was stopped and never resumed after 09/06/19 telephone encounter results from hayley bowman rn. I will remove the episode of care and remove warfarin from the med list. Pt voiced understanding

## 2020-05-19 ENCOUNTER — Other Ambulatory Visit: Payer: Self-pay | Admitting: Cardiology

## 2020-05-20 NOTE — Telephone Encounter (Signed)
Pt's pharmacy is requesting a refill on amoxicillin. Would Dr. Dulce Sellar like to refill this medication? Please address

## 2020-07-25 DIAGNOSIS — I340 Nonrheumatic mitral (valve) insufficiency: Secondary | ICD-10-CM | POA: Insufficient documentation

## 2020-07-25 DIAGNOSIS — R7303 Prediabetes: Secondary | ICD-10-CM | POA: Insufficient documentation

## 2020-07-25 DIAGNOSIS — G43909 Migraine, unspecified, not intractable, without status migrainosus: Secondary | ICD-10-CM | POA: Insufficient documentation

## 2020-07-25 DIAGNOSIS — H269 Unspecified cataract: Secondary | ICD-10-CM | POA: Insufficient documentation

## 2020-08-13 ENCOUNTER — Ambulatory Visit (INDEPENDENT_AMBULATORY_CARE_PROVIDER_SITE_OTHER): Payer: Medicare Other | Admitting: Cardiology

## 2020-08-13 ENCOUNTER — Other Ambulatory Visit: Payer: Self-pay

## 2020-08-13 ENCOUNTER — Encounter: Payer: Self-pay | Admitting: Cardiology

## 2020-08-13 VITALS — BP 100/70 | HR 84 | Ht 73.0 in | Wt 190.0 lb

## 2020-08-13 DIAGNOSIS — I480 Paroxysmal atrial fibrillation: Secondary | ICD-10-CM

## 2020-08-13 DIAGNOSIS — Z9889 Other specified postprocedural states: Secondary | ICD-10-CM | POA: Diagnosis not present

## 2020-08-13 DIAGNOSIS — Z8679 Personal history of other diseases of the circulatory system: Secondary | ICD-10-CM

## 2020-08-13 DIAGNOSIS — E785 Hyperlipidemia, unspecified: Secondary | ICD-10-CM | POA: Diagnosis not present

## 2020-08-13 DIAGNOSIS — I493 Ventricular premature depolarization: Secondary | ICD-10-CM

## 2020-08-13 NOTE — Progress Notes (Signed)
Cardiology Office Note:    Date:  08/13/2020   ID:  Alejandro Burnett, DOB Nov 20, 1950, MRN [id number]  PCP:  [name], MD  Cardiologist:  Norman Herrlich, MD    Referring MD: [name], MD    ASSESSMENT:    1. S/P Maze operation for atrial fibrillation   2. S/P mitral valve repair   3. PAF (paroxysmal atrial fibrillation) (HCC)   4. PVC (premature ventricular contraction)   5. Dyslipidemia    PLAN:    In order of problems listed above:  1. Overall is done well I asked him he is having more recurrences of atrial fibrillation and to let me know he might require anticoagulation and antiarrhythmic drug.  He will continue currently as he does take his beta-blocker as needed 2. Stable not having symptoms of PVC 3. Continue statin today check labs including CMP lipids and a CBC with his previous postoperative anemia   Next appointment: 6 months   Medication Adjustments/Labs and Tests Ordered: Current medicines are reviewed at length with the patient today.  Concerns regarding medicines are outlined above.  No orders of the defined types were placed in this encounter.  No orders of the defined types were placed in this encounter.   Chief Complaint  Patient presents with  . Follow-up    History of Present Illness:    Alejandro Burnett is a 69 y.o. male with a hx of mitral valve repair for severe mitral regurgitation, paroxysmal atrial fibrillation status post surgical maze procedure PVCs and dyslipidemia last seen 02/15/2020 maintaining sinus rhythm. 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  He is maintained sinus rhythm except for one brief episode of atrial fibrillation he took beta-blocker to control the rate and converted spontaneously.  He is not anticoagulated. Past Medical History:  Diagnosis Date  . Asthma   .  Benign prostatic hyperplasia with urinary obstruction   . BPH (benign prostatic hyperplasia)   . Dyslipidemia   . Early cataracts, bilateral   . Gastroesophageal reflux disease   . Hematuria 04/17/2014   Microscopic  . Hyperlipemia   . Migraine headache   . Migraines   . Mitral regurgitation   . Mitral valve prolapse   . Nephrolithiasis 11/04/2015  . Non-rheumatic mitral regurgitation   . Normal coronary arteries   . Paroxysmal atrial fibrillation (HCC)   . Pre-diabetes   . S/P minimally invasive maze operation for atrial fibrillation 03/07/2019   Complete bilateral atrial lesion set using cryothermy and bipolar radiofrequency ablation with clipping of LA appendage via right mini thoracotomy approach  . S/P minimally invasive mitral valve repair 03/07/2019   Complex valvuloplasty including triangular resection of middle scallop of posterior leaflet and 34 mm Sorin Memo 4D ring annuloplasty via right mini thoracotomy approach    Past Surgical History:  Procedure Laterality Date  . APPENDECTOMY    . BACK SURGERY    . COLONOSCOPY    . MINIMALLY INVASIVE MAZE PROCEDURE N/A 03/07/2019   Procedure: MINIMALLY INVASIVE MAZE PROCEDURE;  Surgeon: Purcell Nails, MD;  Location: [facility] OR;  Service: Open Heart Surgery;  Laterality: N/A;  . MITRAL VALVE REPAIR Right 03/07/2019   Procedure: MINIMALLY INVASIVE MITRAL VALVE REPAIR (MVR) USING MEMO 4D SIZE 34;  Surgeon: Purcell Nails, MD;  Location: [facility] OR;  Service: Open Heart Surgery;  Laterality: Right;  . RIGHT/LEFT HEART CATH AND CORONARY ANGIOGRAPHY N/A 10/09/2018   Procedure: RIGHT/LEFT HEART CATH AND CORONARY ANGIOGRAPHY;  Surgeon: Tonny Bollman, MD;  Location: [facility] INVASIVE CV LAB;  Service: Cardiovascular;  Laterality: N/A;  . TEE WITHOUT CARDIOVERSION N/A 08/24/2018   Procedure: TRANSESOPHAGEAL ECHOCARDIOGRAM (TEE);  Surgeon: Thurmon Fair, MD;  Location: [facility] ENDOSCOPY;  Service: Cardiovascular;  Laterality: N/A;  . TEE WITHOUT CARDIOVERSION  N/A 03/07/2019   Procedure: TRANSESOPHAGEAL ECHOCARDIOGRAM (TEE);  Surgeon: Purcell Nails, MD;  Location: [facility] OR;  Service: Open Heart Surgery;  Laterality: N/A;  . TONSILLECTOMY      Current Medications: Current Meds  Medication Sig  . albuterol (PROVENTIL HFA;VENTOLIN HFA) 108 (90 Base) MCG/ACT inhaler Inhale 1-2 puffs into the lungs every 6 (six) hours as needed for wheezing or shortness of breath.   Marland Kitchen amoxicillin (AMOXIL) 500 MG capsule TAKE 4 CAPSULES 45 MINUTES PRIOR TO DENTAL PROCEDURES  . aspirin EC 81 MG tablet Take 81 mg by mouth daily. Swallow whole.  . esomeprazole (NEXIUM) 40 MG capsule Take 40 mg by mouth at bedtime.   . finasteride (PROSCAR) 5 MG tablet Take 5 mg by mouth at bedtime.   . fluorouracil (EFUDEX) 5 % cream Apply 1 application topically 2 (two) times daily as needed (skin spots).  . fluticasone (FLONASE) 50 MCG/ACT nasal spray Place 1 spray into both nostrils daily as needed for allergies or rhinitis.   . fluticasone-salmeterol (ADVAIR HFA) 230-21 MCG/ACT inhaler Inhale 2 puffs into the lungs 2 (two) times daily as needed (shortness of breath).  . metFORMIN (GLUCOPHAGE-XR) 500 MG 24 hr tablet Take 250 mg by mouth daily with breakfast. 0.5 tab daily  . metoprolol tartrate (LOPRESSOR) 25 MG tablet Take 0.5 tablets (12.5 mg total) by mouth daily as needed.  . montelukast (SINGULAIR) 10 MG tablet Take 10 mg by mouth daily.  . sildenafil (REVATIO) 20 MG tablet Take 2-5 tablets by mouth as needed. Take 2-5 tablets on an empty stomach 1 hour prior to intercourse as needed  . simvastatin (ZOCOR) 40 MG tablet Take 40 mg by mouth at bedtime.   . triamcinolone cream (KENALOG) 0.1 % Apply 1 application topically daily as needed (eczema). Mixed with eucerin     Allergies:   Apple, Banana, Peanut-containing drug products, Strawberry (diagnostic), and Tamsulosin   Social History   Socioeconomic History  . Marital status: Married    Spouse name: Not on file  . Number of  children: Not on file  . Years of education: Not on file  . Highest education level: Not on file  Occupational History  . Not on file  Tobacco Use  . Smoking status: Former Smoker    Packs/day: 0.50    Types: Cigarettes    Quit date: 1986    Years since quitting: 35.9  . Smokeless tobacco: Former Neurosurgeon    Types: Engineer, drilling  . Vaping Use: Never used  Substance and Sexual Activity  . Alcohol use: Never  . Drug use: Never  . Sexual activity: Yes    Birth control/protection: None  Other Topics Concern  . Not on file  Social History Narrative  . Not on file   Social Determinants of Health   Financial Resource Strain: Not on file  Food Insecurity: Not on file  Transportation Needs: Not on file  Physical Activity: Not on file  Stress: Not on file  Social Connections: Not on file     Family History: The patient's family history  includes Cancer in his father; Hypertension in his mother. ROS:   Please see the history of present illness.    All other systems reviewed and are negative.  EKGs/Labs/Other Studies Reviewed:    The following studies were reviewed today:    Recent Labs: 02/15/2020: ALT 15; BUN 22; Creatinine, Ser 1.08; Hemoglobin 14.4; Platelets 174; Potassium 4.2; Sodium 135; TSH 0.949  Recent Lipid Panel    Component Value Date/Time   CHOL 177 02/15/2020 1508   TRIG 65 02/15/2020 1508   HDL 57 02/15/2020 1508   CHOLHDL 3.1 02/15/2020 1508   LDLCALC 108 (H) 02/15/2020 1508    Physical Exam:    VS:  BP 100/70 (BP Location: Right Arm, Patient Position: Sitting, Cuff Size: Normal)   Pulse 84   Ht 6\' 1"  (1.854 m)   Wt 190 lb (86.2 kg)   SpO2 95%   BMI 25.07 kg/m     Wt Readings from Last 3 Encounters:  08/13/20 190 lb (86.2 kg)  03/03/20 180 lb (81.6 kg)  02/15/20 188 lb (85.3 kg)     GEN:  Well nourished, well developed in no acute distress HEENT: Normal NECK: No JVD; No carotid bruits LYMPHATICS: No lymphadenopathy CARDIAC: Baseline  sinus rhythm regular today RRR, no murmurs, rubs, gallops RESPIRATORY:  Clear to auscultation without rales, wheezing or rhonchi  ABDOMEN: Soft, non-tender, non-distended MUSCULOSKELETAL:  No edema; No deformity  SKIN: Warm and dry NEUROLOGIC:  Alert and oriented x 3 PSYCHIATRIC:  Normal affect    Signed, 02/17/20, MD  08/13/2020 1:29 PM    [facility]

## 2020-08-13 NOTE — Addendum Note (Signed)
Addended by: Hazle Quant on: 08/13/2020 01:48 PM   Modules accepted: Orders

## 2020-08-13 NOTE — Patient Instructions (Signed)
Medication Instructions:  Your physician recommends that you continue on your current medications as directed. Please refer to the Current Medication list given to you today.  *If you need a refill on your cardiac medications before your next appointment, please call your pharmacy*   Lab Work: Your physician recommends that you return for lab work today: cmp, lipids, cbc If you have labs (blood work) drawn today and your tests are completely normal, you will receive your results only by: Marland Kitchen MyChart Message (if you have MyChart) OR . A paper copy in the mail If you have any lab test that is abnormal or we need to change your treatment, we will call you to review the results.   Testing/Procedures: None   Follow-Up: At [facility], you and your health needs are our priority.  As part of our continuing mission to provide you with exceptional heart care, we have created designated Provider Care Teams.  These Care Teams include your primary Cardiologist (physician) and Advanced Practice Providers (APPs -  Physician Assistants and Nurse Practitioners) who all work together to provide you with the care you need, when you need it.  We recommend signing up for the patient portal called "MyChart".  Sign up information is provided on this After Visit Summary.  MyChart is used to connect with patients for Virtual Visits (Telemedicine).  Patients are able to view lab/test results, encounter notes, upcoming appointments, etc.  Non-urgent messages can be sent to your provider as well.   To learn more about what you can do with MyChart, go to [email or web address].    Your next appointment:   6 month(s)  The format for your next appointment:   In Person  Provider:   Norman Herrlich, MD   Other Instructions

## 2020-08-14 LAB — CBC
Hematocrit: 42.6 % (ref 37.5–51.0)
Hemoglobin: 14.4 g/dL (ref 13.0–17.7)
MCH: 30.7 pg (ref 26.6–33.0)
MCHC: 33.8 g/dL (ref 31.5–35.7)
MCV: 91 fL (ref 79–97)
Platelets: 162 10*3/uL (ref 150–450)
RBC: 4.69 x10E6/uL (ref 4.14–5.80)
RDW: 12 % (ref 11.6–15.4)
WBC: 7.7 10*3/uL (ref 3.4–10.8)

## 2020-08-14 LAB — COMPREHENSIVE METABOLIC PANEL
ALT: 20 IU/L (ref 0–44)
AST: 24 IU/L (ref 0–40)
Albumin/Globulin Ratio: 1.6 (ref 1.2–2.2)
Albumin: 3.9 g/dL (ref 3.8–4.8)
Alkaline Phosphatase: 44 IU/L (ref 44–121)
BUN/Creatinine Ratio: 25 — ABNORMAL HIGH (ref 10–24)
BUN: 24 mg/dL (ref 8–27)
Bilirubin Total: 0.3 mg/dL (ref 0.0–1.2)
CO2: 25 mmol/L (ref 20–29)
Calcium: 9.3 mg/dL (ref 8.6–10.2)
Chloride: 103 mmol/L (ref 96–106)
Creatinine, Ser: 0.97 mg/dL (ref 0.76–1.27)
GFR calc Af Amer: 93 mL/min/{1.73_m2} (ref >59)
GFR calc non Af Amer: 80 mL/min/{1.73_m2} (ref >59)
Globulin, Total: 2.4 g/dL (ref 1.5–4.5)
Glucose: 95 mg/dL (ref 65–99)
Potassium: 4.3 mmol/L (ref 3.5–5.2)
Sodium: 139 mmol/L (ref 134–144)
Total Protein: 6.3 g/dL (ref 6.0–8.5)

## 2020-08-14 LAB — LIPID PANEL
Chol/HDL Ratio: 2.8 ratio (ref 0.0–5.0)
Cholesterol, Total: 145 mg/dL (ref 100–199)
HDL: 52 mg/dL (ref >39)
LDL Chol Calc (NIH): 79 mg/dL (ref 0–99)
Triglycerides: 71 mg/dL (ref 0–149)
VLDL Cholesterol Cal: 14 mg/dL (ref 5–40)

## 2020-08-18 ENCOUNTER — Telehealth: Payer: Self-pay

## 2020-08-18 NOTE — Telephone Encounter (Signed)
Spoke with patient regarding results and recommendation.  Patient verbalizes understanding and is agreeable to plan of care. Advised patient to call back with any issues or concerns.  

## 2020-11-24 ENCOUNTER — Other Ambulatory Visit: Payer: Self-pay | Admitting: Cardiology

## 2020-11-24 NOTE — Telephone Encounter (Signed)
Rx refill sent to pharmacy. 

## 2021-02-01 NOTE — Progress Notes (Signed)
Cardiology Office Note:    Date:  02/03/2021   ID:  DAMAR PETIT, DOB 07-23-1951, MRN [id number]  PCP:  [name], MD  Cardiologist:  Norman Herrlich, MD    Referring MD: [name], MD    ASSESSMENT:    1. S/P mitral valve repair   2. S/P Maze operation for atrial fibrillation   3. PVC (premature ventricular contraction)   4. Dyslipidemia    PLAN:    In order of problems listed above:  He continues to do well after mitral valve repair for severe MR and surgical maze maintaining sinus rhythm no signs of heart failure.  He is continue to take beta-blocker as needed for palpitation.  At this time point no longer anticoagulated. Stable maintaining sinus rhythm Infrequent palpitation relieved.  Beta-blocker Continue statin check lipid profile CMP Next appointment: 6 months   Medication Adjustments/Labs and Tests Ordered: Current medicines are reviewed at length with the patient today.  Concerns regarding medicines are outlined above.  Orders Placed This Encounter  Procedures   Comprehensive metabolic panel   Lipid panel   EKG 12-Lead    No orders of the defined types were placed in this encounter.   Chief Complaint  Patient presents with   Follow-up    After mitral valve repair and surgical Maze procedure for atrial fibrillation in 2000     History of Present Illness:    Alejandro Burnett is a 70 y.o. male with a hx of primary severe mitral regurgitation paroxysmal atrial fibrillation mitral valve repair and surgical Maze procedure 03/07/2019 PVCs and dyslipidemia last seen 08/13/2020. Compliance with diet, lifestyle and medications: Yes  He has done very well particularly in the last year and has recovered completely from valvular surgery. No exercise intolerance exertional shortness of breath chest pain or syncope. He has brief palpitation not lasting more than a few minutes no sustained arrhythmia and he is improved when he takes a beta-blocker as needed.   The episodes are not frequent. He is no longer anticoagulated No fever or chills He tolerates his statin without muscle pain or weakness  Past Medical History:  Diagnosis Date   Asthma    Benign prostatic hyperplasia with urinary obstruction    BPH (benign prostatic hyperplasia)    Dyslipidemia    Early cataracts, bilateral    Gastroesophageal reflux disease    Hematuria 04/17/2014   Microscopic   Hyperlipemia    Migraine headache    Migraines    Mitral regurgitation    Mitral valve prolapse    Nephrolithiasis 11/04/2015   Non-rheumatic mitral regurgitation    Normal coronary arteries    Paroxysmal atrial fibrillation (HCC)    Pre-diabetes    S/P minimally invasive maze operation for atrial fibrillation 03/07/2019   Complete bilateral atrial lesion set using cryothermy and bipolar radiofrequency ablation with clipping of LA appendage via right mini thoracotomy approach   S/P minimally invasive mitral valve repair 03/07/2019   Complex valvuloplasty including triangular resection of middle scallop of posterior leaflet and 34 mm Sorin Memo 4D ring annuloplasty via right mini thoracotomy approach    Past Surgical History:  Procedure Laterality Date   APPENDECTOMY     BACK SURGERY     COLONOSCOPY     MINIMALLY INVASIVE MAZE PROCEDURE N/A 03/07/2019   Procedure: MINIMALLY INVASIVE MAZE PROCEDURE;  Surgeon: Purcell Nails, MD;  Location: [facility] OR;  Service: Open Heart Surgery;  Laterality: N/A;   MITRAL VALVE REPAIR Right 03/07/2019  Procedure: MINIMALLY INVASIVE MITRAL VALVE REPAIR (MVR) USING MEMO 4D SIZE 34;  Surgeon: Purcell Nails, MD;  Location: [facility] OR;  Service: Open Heart Surgery;  Laterality: Right;   RIGHT/LEFT HEART CATH AND CORONARY ANGIOGRAPHY N/A 10/09/2018   Procedure: RIGHT/LEFT HEART CATH AND CORONARY ANGIOGRAPHY;  Surgeon: Tonny Bollman, MD;  Location: [facility] INVASIVE CV LAB;  Service: Cardiovascular;  Laterality: N/A;   TEE WITHOUT CARDIOVERSION N/A 08/24/2018    Procedure: TRANSESOPHAGEAL ECHOCARDIOGRAM (TEE);  Surgeon: Thurmon Fair, MD;  Location: [facility] ENDOSCOPY;  Service: Cardiovascular;  Laterality: N/A;   TEE WITHOUT CARDIOVERSION N/A 03/07/2019   Procedure: TRANSESOPHAGEAL ECHOCARDIOGRAM (TEE);  Surgeon: Purcell Nails, MD;  Location: [facility] OR;  Service: Open Heart Surgery;  Laterality: N/A;   TONSILLECTOMY      Current Medications: Current Meds  Medication Sig   albuterol (PROVENTIL HFA;VENTOLIN HFA) 108 (90 Base) MCG/ACT inhaler Inhale 1-2 puffs into the lungs every 6 (six) hours as needed for wheezing or shortness of breath.    amoxicillin (AMOXIL) 500 MG capsule TAKE 4 CAPSULES 45 MINUTES PRIOR TO DENTAL PROCEDURES   aspirin EC 81 MG tablet Take 81 mg by mouth daily. Swallow whole.   esomeprazole (NEXIUM) 40 MG capsule Take 40 mg by mouth at bedtime.    finasteride (PROSCAR) 5 MG tablet Take 5 mg by mouth at bedtime.    fluorouracil (EFUDEX) 5 % cream Apply 1 application topically 2 (two) times daily as needed (skin spots).   fluticasone (FLONASE) 50 MCG/ACT nasal spray Place 1 spray into both nostrils daily as needed for allergies or rhinitis.    fluticasone-salmeterol (ADVAIR HFA) 230-21 MCG/ACT inhaler Inhale 2 puffs into the lungs 2 (two) times daily as needed (shortness of breath).   metoprolol tartrate (LOPRESSOR) 25 MG tablet Take 0.5 tablets (12.5 mg total) by mouth daily as needed. (Patient taking differently: Take 12.5 mg by mouth daily as needed (afib).)   montelukast (SINGULAIR) 10 MG tablet Take 10 mg by mouth daily.   sildenafil (REVATIO) 20 MG tablet Take 2-5 tablets by mouth as needed. Take 2-5 tablets on an empty stomach 1 hour prior to intercourse as needed   simvastatin (ZOCOR) 40 MG tablet Take 40 mg by mouth at bedtime.    triamcinolone cream (KENALOG) 0.1 % Apply 1 application topically daily as needed (eczema). Mixed with eucerin     Allergies:   Apple, Banana, Peanut-containing drug products, Strawberry (diagnostic),  and Tamsulosin   Social History   Socioeconomic History   Marital status: Married    Spouse name: Not on file   Number of children: Not on file   Years of education: Not on file   Highest education level: Not on file  Occupational History   Not on file  Tobacco Use   Smoking status: Former    Packs/day: 0.50    Pack years: 0.00    Types: Cigarettes    Quit date: 49    Years since quitting: 36.4   Smokeless tobacco: Former    Types: Associate Professor Use: Never used  Substance and Sexual Activity   Alcohol use: Never   Drug use: Never   Sexual activity: Yes    Birth control/protection: None  Other Topics Concern   Not on file  Social History Narrative   Not on file   Social Determinants of Health   Financial Resource Strain: Not on file  Food Insecurity: Not on file  Transportation Needs: Not on file  Physical Activity:  Not on file  Stress: Not on file  Social Connections: Not on file     Family History: The patient's family history includes Cancer in his father; Hypertension in his mother. ROS:   Please see the history of present illness.    All other systems reviewed and are negative.  EKGs/Labs/Other Studies Reviewed:    The following studies were reviewed today:  EKG:  EKG ordered today and personally reviewed.  The ekg ordered today demonstrates sinus rhythm and remains normal  Recent Labs: 02/15/2020: TSH 0.949 08/13/2020: ALT 20; BUN 24; Creatinine, Ser 0.97; Hemoglobin 14.4; Platelets 162; Potassium 4.3; Sodium 139  Recent Lipid Panel    Component Value Date/Time   CHOL 145 08/13/2020 1356   TRIG 71 08/13/2020 1356   HDL 52 08/13/2020 1356   CHOLHDL 2.8 08/13/2020 1356   LDLCALC 79 08/13/2020 1356    Physical Exam:    VS:  BP 106/60 (BP Location: Left Arm, Patient Position: Sitting, Cuff Size: Normal)   Pulse 77   Ht 6\' 1"  (1.854 m)   Wt 189 lb (85.7 kg)   SpO2 96%   BMI 24.94 kg/m     Wt Readings from Last 3 Encounters:   02/03/21 189 lb (85.7 kg)  08/13/20 190 lb (86.2 kg)  03/03/20 180 lb (81.6 kg)     GEN:  Well nourished, well developed in no acute distress HEENT: Normal NECK: No JVD; No carotid bruits LYMPHATICS: No lymphadenopathy CARDIAC: RRR, no murmurs, rubs, gallops RESPIRATORY:  Clear to auscultation without rales, wheezing or rhonchi  ABDOMEN: Soft, non-tender, non-distended MUSCULOSKELETAL:  No edema; No deformity  SKIN: Warm and dry NEUROLOGIC:  Alert and oriented x 3 PSYCHIATRIC:  Normal affect    Signed, 05/04/20, MD  02/03/2021 4:03 PM    [facility]

## 2021-02-03 ENCOUNTER — Encounter: Payer: Self-pay | Admitting: Cardiology

## 2021-02-03 ENCOUNTER — Other Ambulatory Visit: Payer: Self-pay

## 2021-02-03 ENCOUNTER — Ambulatory Visit (INDEPENDENT_AMBULATORY_CARE_PROVIDER_SITE_OTHER): Payer: Medicare Other | Admitting: Cardiology

## 2021-02-03 ENCOUNTER — Other Ambulatory Visit: Payer: Self-pay | Admitting: Cardiology

## 2021-02-03 VITALS — BP 106/60 | HR 77 | Ht 73.0 in | Wt 189.0 lb

## 2021-02-03 DIAGNOSIS — E785 Hyperlipidemia, unspecified: Secondary | ICD-10-CM | POA: Diagnosis not present

## 2021-02-03 DIAGNOSIS — Z9889 Other specified postprocedural states: Secondary | ICD-10-CM | POA: Diagnosis not present

## 2021-02-03 DIAGNOSIS — I493 Ventricular premature depolarization: Secondary | ICD-10-CM

## 2021-02-03 DIAGNOSIS — Z8679 Personal history of other diseases of the circulatory system: Secondary | ICD-10-CM

## 2021-02-03 NOTE — Patient Instructions (Signed)

## 2021-02-04 LAB — LIPID PANEL
Chol/HDL Ratio: 3 ratio (ref 0.0–5.0)
Cholesterol, Total: 145 mg/dL (ref 100–199)
HDL: 48 mg/dL (ref >39)
LDL Chol Calc (NIH): 79 mg/dL (ref 0–99)
Triglycerides: 98 mg/dL (ref 0–149)
VLDL Cholesterol Cal: 18 mg/dL (ref 5–40)

## 2021-02-04 LAB — COMPREHENSIVE METABOLIC PANEL
ALT: 15 IU/L (ref 0–44)
AST: 16 IU/L (ref 0–40)
Albumin/Globulin Ratio: 2 (ref 1.2–2.2)
Albumin: 4.4 g/dL (ref 3.8–4.8)
Alkaline Phosphatase: 51 IU/L (ref 44–121)
BUN/Creatinine Ratio: 22 (ref 10–24)
BUN: 21 mg/dL (ref 8–27)
Bilirubin Total: 0.4 mg/dL (ref 0.0–1.2)
CO2: 22 mmol/L (ref 20–29)
Calcium: 9.4 mg/dL (ref 8.6–10.2)
Chloride: 102 mmol/L (ref 96–106)
Creatinine, Ser: 0.96 mg/dL (ref 0.76–1.27)
Globulin, Total: 2.2 g/dL (ref 1.5–4.5)
Glucose: 133 mg/dL — ABNORMAL HIGH (ref 65–99)
Potassium: 4.3 mmol/L (ref 3.5–5.2)
Sodium: 137 mmol/L (ref 134–144)
Total Protein: 6.6 g/dL (ref 6.0–8.5)
eGFR: 86 mL/min/{1.73_m2} (ref >59)

## 2021-02-06 ENCOUNTER — Telehealth: Payer: Self-pay

## 2021-02-06 NOTE — Telephone Encounter (Signed)
-----   Message from Baldo Daub, MD sent at 02/06/2021 10:42 AM EDT ----- Regarding: FW: Good result no changes in treatment ----- Message ----- From: Interface, [facility] Lab Results In Sent: 02/06/2021  10:39 AM EDT To: Baldo Daub, MD

## 2021-02-06 NOTE — Telephone Encounter (Signed)
Spoke with patient regarding results and recommendation.  Patient verbalizes understanding and is agreeable to plan of care. Advised patient to call back with any issues or concerns.  

## 2021-04-20 ENCOUNTER — Other Ambulatory Visit: Payer: Self-pay | Admitting: Cardiology

## 2021-07-13 NOTE — Progress Notes (Signed)
Cardiology Office Note:    Date:  07/14/2021   ID:  Alejandro Burnett, DOB 03-03-1951, MRN [id number]  PCP:  [name], MD  Cardiologist:  Norman Herrlich, MD    Referring MD: [name], MD    ASSESSMENT:    1. S/P mitral valve repair   2. S/P Maze operation for atrial fibrillation   3. PAF (paroxysmal atrial fibrillation) (HCC)   4. PVC (premature ventricular contraction)   5. Dyslipidemia    PLAN:    In order of problems listed above:  I am unsure what to make of his symptoms of shortness of breath with strenuous activity we will recheck his echocardiogram regarding LV function valve function following repair done 2 years ago at this time does not appear to have heart failure and significant mitral regurgitation.  I encouraged him that there is another episode to check his heart rate and blood pressure looking for findings of atrial fibrillation recurrence Stable atrial fibrillation maintaining sinus rhythm no longer anticoagulated Stable takes a beta-blocker as needed with palpitation Continue with statin   Next appointment: 3 months   Medication Adjustments/Labs and Tests Ordered: Current medicines are reviewed at length with the patient today.  Concerns regarding medicines are outlined above.  No orders of the defined types were placed in this encounter.  No orders of the defined types were placed in this encounter.  Chief complaint follow-up after successful mitral valve repair surgical Maze procedure previously had paroxysmal atrial fibrillation and is no longer anticoagulated.  History of Present Illness:    Alejandro Burnett is a 70 y.o. male with a hx of severe mitral regurgitation status post mitral valve repair and surgical Maze procedure 03/06/2021 frequent PVCs and dyslipidemia last seen 02/03/2021 when he is no longer anticoagulated.  Compliance with diet, lifestyle and medications: Yes  He has had some mild palpitation not severe or sustained and  occurs when he is in stressful situations In general he is doing well but recently has had 2 episodes but with strenuous activity was very short of breath and it took him quite a while to recover afterwards no edema orthopnea chest pain no associated palpitations.  He has background history of anemia but his last hemoglobin was 14.4 done 11 months ago and has had no clinical bleeding. Past Medical History:  Diagnosis Date   Asthma    Benign prostatic hyperplasia with urinary obstruction    BPH (benign prostatic hyperplasia)    Dyslipidemia    Early cataracts, bilateral    Gastroesophageal reflux disease    Hematuria 04/17/2014   Microscopic   Hyperlipemia    Migraine headache    Migraines    Mitral regurgitation    Mitral valve prolapse    Nephrolithiasis 11/04/2015   Non-rheumatic mitral regurgitation    Normal coronary arteries    Paroxysmal atrial fibrillation (HCC)    Pre-diabetes    S/P minimally invasive maze operation for atrial fibrillation 03/07/2019   Complete bilateral atrial lesion set using cryothermy and bipolar radiofrequency ablation with clipping of LA appendage via right mini thoracotomy approach   S/P minimally invasive mitral valve repair 03/07/2019   Complex valvuloplasty including triangular resection of middle scallop of posterior leaflet and 34 mm Sorin Memo 4D ring annuloplasty via right mini thoracotomy approach    Past Surgical History:  Procedure Laterality Date   APPENDECTOMY     BACK SURGERY     COLONOSCOPY     MINIMALLY INVASIVE MAZE PROCEDURE N/A  03/07/2019   Procedure: MINIMALLY INVASIVE MAZE PROCEDURE;  Surgeon: [name], MD;  Location: [place];  Service: Open Heart Surgery;  Laterality: N/A;   MITRAL VALVE REPAIR Right 03/07/2019   Procedure: MINIMALLY INVASIVE MITRAL VALVE REPAIR (MVR) USING MEMO 4D SIZE 34;  Surgeon: [name], MD;  Location: [place];  Service: Open Heart Surgery;  Laterality: Right;   RIGHT/LEFT HEART CATH AND CORONARY  ANGIOGRAPHY N/A 10/09/2018   Procedure: RIGHT/LEFT HEART CATH AND CORONARY ANGIOGRAPHY;  Surgeon: Sherren Mocha, MD;  Location: [place] CV LAB;  Service: Cardiovascular;  Laterality: N/A;   TEE WITHOUT CARDIOVERSION N/A 08/24/2018   Procedure: TRANSESOPHAGEAL ECHOCARDIOGRAM (TEE);  Surgeon: Sanda Klein, MD;  Location: [place];  Service: Cardiovascular;  Laterality: N/A;   TEE WITHOUT CARDIOVERSION N/A 03/07/2019   Procedure: TRANSESOPHAGEAL ECHOCARDIOGRAM (TEE);  Surgeon: [name], MD;  Location: [place];  Service: Open Heart Surgery;  Laterality: N/A;   TONSILLECTOMY      Current Medications: Current Meds  Medication Sig   albuterol (PROVENTIL HFA;VENTOLIN HFA) 108 (90 Base) MCG/ACT inhaler Inhale 1-2 puffs into the lungs every 6 (six) hours as needed for wheezing or shortness of breath.    amoxicillin (AMOXIL) 500 MG capsule TAKE 4 CAPSULES BY MOUTH 45 MINUTES PRIOR TO DENTAL PROCEDURES   aspirin EC 81 MG tablet Take 81 mg by mouth daily. Swallow whole.   esomeprazole (NEXIUM) 40 MG capsule Take 40 mg by mouth at bedtime.    finasteride (PROSCAR) 5 MG tablet Take 5 mg by mouth at bedtime.    fluorouracil (EFUDEX) 5 % cream Apply 1 application topically 2 (two) times daily as needed (skin spots).   fluticasone (FLONASE) 50 MCG/ACT nasal spray Place 1 spray into both nostrils daily as needed for allergies or rhinitis.    fluticasone-salmeterol (ADVAIR HFA) 230-21 MCG/ACT inhaler Inhale 2 puffs into the lungs 2 (two) times daily as needed (shortness of breath).   metoprolol tartrate (LOPRESSOR) 25 MG tablet Take 0.5 tablets (12.5 mg total) by mouth daily as needed. (Patient taking differently: Take 12.5 mg by mouth daily as needed (afib).)   montelukast (SINGULAIR) 10 MG tablet Take 10 mg by mouth daily.   sildenafil (REVATIO) 20 MG tablet Take 2-5 tablets by mouth as needed. Take 2-5 tablets on an empty stomach 1 hour prior to intercourse as needed   simvastatin (ZOCOR) 40 MG  tablet Take 40 mg by mouth at bedtime.    triamcinolone cream (KENALOG) 0.1 % Apply 1 application topically daily as needed (eczema). Mixed with eucerin     Allergies:   Apple, Banana, Peanut-containing drug products, Strawberry (diagnostic), and Tamsulosin   Social History   Socioeconomic History   Marital status: Married    Spouse name: Not on file   Number of children: Not on file   Years of education: Not on file   Highest education level: Not on file  Occupational History   Not on file  Tobacco Use   Smoking status: Former    Packs/day: 0.50    Types: Cigarettes    Quit date: 65    Years since quitting: 36.9   Smokeless tobacco: Former    Types: Nurse, children's Use: Never used  Substance and Sexual Activity   Alcohol use: Never   Drug use: Never   Sexual activity: Yes    Birth control/protection: None  Other Topics Concern   Not on file  Social History Narrative   Not  on file   Social Determinants of Health   Financial Resource Strain: Not on file  Food Insecurity: Not on file  Transportation Needs: Not on file  Physical Activity: Not on file  Stress: Not on file  Social Connections: Not on file     Family History: The patient's family history includes Cancer in his father; Hypertension in his mother. ROS:   Please see the history of present illness.    All other systems reviewed and are negative.  EKGs/Labs/Other Studies Reviewed:    The following studies were reviewed today:  EKG:  EKG ordered today and personally reviewed.  The ekg ordered today demonstrates sinus rhythm normal EKG  Recent Labs: 08/13/2020: Hemoglobin 14.4; Platelets 162 02/03/2021: ALT 15; BUN 21; Creatinine, Ser 0.96; Potassium 4.3; Sodium 137  Recent Lipid Panel    Component Value Date/Time   CHOL 145 02/03/2021 1609   TRIG 98 02/03/2021 1609   HDL 48 02/03/2021 1609   CHOLHDL 3.0 02/03/2021 1609   LDLCALC 79 02/03/2021 1609    Physical Exam:    VS:  BP  118/70   Pulse 68   Ht 6\' 1"  (1.854 m)   Wt 190 lb (86.2 kg)   SpO2 98%   BMI 25.07 kg/m     Wt Readings from Last 3 Encounters:  07/14/21 190 lb (86.2 kg)  02/03/21 189 lb (85.7 kg)  08/13/20 190 lb (86.2 kg)     GEN:  Well nourished, well developed in no acute distress HEENT: Normal NECK: No JVD; No carotid bruits LYMPHATICS: No lymphadenopathy CARDIAC: RRR, no murmurs, rubs, gallops RESPIRATORY:  Clear to auscultation without rales, wheezing or rhonchi  ABDOMEN: Soft, non-tender, non-distended MUSCULOSKELETAL:  No edema; No deformity  SKIN: Warm and dry NEUROLOGIC:  Alert and oriented x 3 PSYCHIATRIC:  Normal affect    Signed, Shirlee More, MD  07/14/2021 2:57 PM    [facility]

## 2021-07-14 ENCOUNTER — Other Ambulatory Visit: Payer: Self-pay

## 2021-07-14 ENCOUNTER — Encounter: Payer: Self-pay | Admitting: Cardiology

## 2021-07-14 ENCOUNTER — Ambulatory Visit (INDEPENDENT_AMBULATORY_CARE_PROVIDER_SITE_OTHER): Payer: Medicare Other | Admitting: Cardiology

## 2021-07-14 VITALS — BP 118/70 | HR 68 | Ht 73.0 in | Wt 190.0 lb

## 2021-07-14 DIAGNOSIS — I493 Ventricular premature depolarization: Secondary | ICD-10-CM | POA: Diagnosis not present

## 2021-07-14 DIAGNOSIS — Z8679 Personal history of other diseases of the circulatory system: Secondary | ICD-10-CM

## 2021-07-14 DIAGNOSIS — R0602 Shortness of breath: Secondary | ICD-10-CM

## 2021-07-14 DIAGNOSIS — I480 Paroxysmal atrial fibrillation: Secondary | ICD-10-CM

## 2021-07-14 DIAGNOSIS — Z9889 Other specified postprocedural states: Secondary | ICD-10-CM

## 2021-07-14 DIAGNOSIS — E785 Hyperlipidemia, unspecified: Secondary | ICD-10-CM | POA: Diagnosis not present

## 2021-07-14 NOTE — Patient Instructions (Signed)
Medication Instructions:  Your physician recommends that you continue on your current medications as directed. Please refer to the Current Medication list given to you today.  *If you need a refill on your cardiac medications before your next appointment, please call your pharmacy*   Lab Work: None ordered If you have labs (blood work) drawn today and your tests are completely normal, you will receive your results only by: MyChart Message (if you have MyChart) OR A paper copy in the mail If you have any lab test that is abnormal or we need to change your treatment, we will call you to review the results.   Testing/Procedures: Your physician has requested that you have an echocardiogram. Echocardiography is a painless test that uses sound waves to create images of your heart. It provides your doctor with information about the size and shape of your heart and how well your heart's chambers and valves are working. This procedure takes approximately one hour. There are no restrictions for this procedure.    Follow-Up: At [facility], you and your health needs are our priority.  As part of our continuing mission to provide you with exceptional heart care, we have created designated Provider Care Teams.  These Care Teams include your primary Cardiologist (physician) and Advanced Practice Providers (APPs -  Physician Assistants and Nurse Practitioners) who all work together to provide you with the care you need, when you need it.  We recommend signing up for the patient portal called "MyChart".  Sign up information is provided on this After Visit Summary.  MyChart is used to connect with patients for Virtual Visits (Telemedicine).  Patients are able to view lab/test results, encounter notes, upcoming appointments, etc.  Non-urgent messages can be sent to your provider as well.   To learn more about what you can do with MyChart, go to [email or web address].    Your next appointment:   3  month(s)  The format for your next appointment:   In Person  Provider:   Norman Herrlich, MD   Other Instructions Echocardiogram An echocardiogram is a test that uses sound waves (ultrasound) to produce images of the heart. Images from an echocardiogram can provide important information about: Heart size and shape. The size and thickness and movement of your heart's walls. Heart muscle function and strength. Heart valve function or if you have stenosis. Stenosis is when the heart valves are too narrow. If blood is flowing backward through the heart valves (regurgitation). A tumor or infectious growth around the heart valves. Areas of heart muscle that are not working well because of poor blood flow or injury from a heart attack. Aneurysm detection. An aneurysm is a weak or damaged part of an artery wall. The wall bulges out from the normal force of blood pumping through the body. Tell a health care provider about: Any allergies you have. All medicines you are taking, including vitamins, herbs, eye drops, creams, and over-the-counter medicines. Any blood disorders you have. Any surgeries you have had. Any medical conditions you have. Whether you are pregnant or may be pregnant. What are the risks? Generally, this is a safe test. However, problems may occur, including an allergic reaction to dye (contrast) that may be used during the test. What happens before the test? No specific preparation is needed. You may eat and drink normally. What happens during the test? You will take off your clothes from the waist up and put on a hospital gown. Electrodes or electrocardiogram (ECG)patches may be placed on  your chest. The electrodes or patches are then connected to a device that monitors your heart rate and rhythm. You will lie down on a table for an ultrasound exam. A gel will be applied to your chest to help sound waves pass through your skin. A handheld device, called a transducer, will  be pressed against your chest and moved over your heart. The transducer produces sound waves that travel to your heart and bounce back (or "echo" back) to the transducer. These sound waves will be captured in real-time and changed into images of your heart that can be viewed on a video monitor. The images will be recorded on a computer and reviewed by your health care provider. You may be asked to change positions or hold your breath for a short time. This makes it easier to get different views or better views of your heart. In some cases, you may receive contrast through an IV in one of your veins. This can improve the quality of the pictures from your heart. The procedure may vary among health care providers and hospitals.   What can I expect after the test? You may return to your normal, everyday life, including diet, activities, and medicines, unless your health care provider tells you not to do that. Follow these instructions at home: It is up to you to get the results of your test. Ask your health care provider, or the department that is doing the test, when your results will be ready. Keep all follow-up visits. This is important. Summary An echocardiogram is a test that uses sound waves (ultrasound) to produce images of the heart. Images from an echocardiogram can provide important information about the size and shape of your heart, heart muscle function, heart valve function, and other possible heart problems. You do not need to do anything to prepare before this test. You may eat and drink normally. After the echocardiogram is completed, you may return to your normal, everyday life, unless your health care provider tells you not to do that. This information is not intended to replace advice given to you by your health care provider. Make sure you discuss any questions you have with your health care provider. Document Revised: 04/01/2020 Document Reviewed: 04/01/2020 Elsevier Patient  Education  2021 [employer]   

## 2021-08-03 ENCOUNTER — Other Ambulatory Visit: Payer: Self-pay

## 2021-08-03 ENCOUNTER — Ambulatory Visit (INDEPENDENT_AMBULATORY_CARE_PROVIDER_SITE_OTHER): Payer: Medicare Other

## 2021-08-03 DIAGNOSIS — Z954 Presence of other heart-valve replacement: Secondary | ICD-10-CM | POA: Diagnosis not present

## 2021-08-03 DIAGNOSIS — Z9889 Other specified postprocedural states: Secondary | ICD-10-CM | POA: Diagnosis not present

## 2021-08-03 DIAGNOSIS — R0602 Shortness of breath: Secondary | ICD-10-CM

## 2021-08-03 LAB — ECHOCARDIOGRAM COMPLETE
Area-P 1/2: 2.33 cm2
Calc EF: 47.6 %
MV VTI: 1.38 cm2
S' Lateral: 3.35 cm
Single Plane A2C EF: 51.5 %
Single Plane A4C EF: 45.3 %

## 2021-09-08 ENCOUNTER — Ambulatory Visit: Payer: Medicare Other | Admitting: Cardiology

## 2021-10-22 ENCOUNTER — Ambulatory Visit: Payer: Medicare Other | Admitting: Cardiology

## 2021-11-06 ENCOUNTER — Other Ambulatory Visit: Payer: Self-pay | Admitting: Cardiology

## 2022-05-28 ENCOUNTER — Other Ambulatory Visit: Payer: Self-pay

## 2022-05-28 ENCOUNTER — Other Ambulatory Visit: Payer: Self-pay | Admitting: Cardiology

## 2022-05-28 MED ORDER — AMOXICILLIN 500 MG PO CAPS: ORAL_CAPSULE | ORAL | 0 refills | Status: DC

## 2022-05-28 NOTE — Telephone Encounter (Signed)
Refill to pharmacy with message for patient to schedule appt

## 2022-09-08 ENCOUNTER — Telehealth: Payer: Self-pay | Admitting: Cardiology

## 2022-09-08 ENCOUNTER — Other Ambulatory Visit: Payer: Self-pay | Admitting: Cardiology

## 2022-09-08 MED ORDER — METOPROLOL TARTRATE 25 MG PO TABS: 12.5000 mg | ORAL_TABLET | Freq: Every day | ORAL | 3 refills | Status: DC | PRN

## 2022-09-08 NOTE — Telephone Encounter (Signed)
*  STAT* If patient is at the pharmacy, call can be transferred to refill team.   1. Which medications need to be refilled? (please list name of each medication and dose if known) metoprolol tartrate (LOPRESSOR) 25 MG tablet   2. Which pharmacy/location (including street and city if local pharmacy) is medication to be sent to?  [place], [place]    3. Do they need a 30 day or 90 day supply? [place]

## 2023-03-20 NOTE — Progress Notes (Signed)
Cardiology Office Note:    Date:  03/21/2023   ID:  Alejandro Burnett, DOB December 16, 1950, MRN [id number]  PCP:  [name], MD  Cardiologist:  Norman Herrlich, MD    Referring MD: [name], MD    ASSESSMENT:    1. S/P mitral valve repair   2. S/P Maze operation for atrial fibrillation   3. PAF (paroxysmal atrial fibrillation) (HCC)   4. PVC (premature ventricular contraction)   5. Dyslipidemia    PLAN:    In order of problems listed above:  Overall he is made a good recovery from mitral valve surgery no indication of recurrent valvular regurgitation Symptomatic palpitation either due to PVCs or recurrence of his atrial arrhythmia 1 weeks ZIO monitor he is pleased with the results and for now we will continue his as needed beta-blocker Continue his statin labs are followed with his PCP   Next appointment: 3 months   Medication Adjustments/Labs and Tests Ordered: Current medicines are reviewed at length with the patient today.  Concerns regarding medicines are outlined above.  Orders Placed This Encounter  Procedures   LONG TERM MONITOR (3-14 DAYS)   EKG 12-Lead   No orders of the defined types were placed in this encounter.    History of Present Illness:    Alejandro Burnett is a 72 y.o. male with a hx of mitral valve repair maze surgery for atrial fibrillation in 2020 maintaining sinus rhythm PVCs and dyslipidemia last seen 07/14/2021.  Following the visit and echocardiogram 08/13/2021 showing low normal ejection fraction 50 to 55% with mild LVH normal right ventricular size function and pulmonary artery pressure.  The mitral valve has been repaired with no evidence of valve dysfunction or mitral stenosis.  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  Compliance with diet, lifestyle and medications: Yes  In general is done well but says he has never regained his strength and endurance he had prior to mitral valve surgery He  finds that he sleeps more since he retired He is intermittently aware of his heart beating and he takes a beta-blocker as needed with good relief he is concerned he is having atrial fibrillation again No edema orthopnea chest pain or syncope Past Medical History:  Diagnosis Date   Asthma    Benign prostatic hyperplasia with urinary obstruction    BPH (benign prostatic hyperplasia)    Dyslipidemia    Early cataracts, bilateral    Gastroesophageal reflux disease    Hematuria 04/17/2014   Microscopic   Hyperlipemia    Migraine headache    Migraines    Mitral regurgitation    Mitral valve prolapse    Nephrolithiasis 11/04/2015   Non-rheumatic mitral regurgitation    Normal coronary arteries    Paroxysmal atrial fibrillation (HCC)    Pre-diabetes    S/P minimally invasive maze operation for atrial fibrillation 03/07/2019   Complete bilateral atrial lesion set using cryothermy and bipolar radiofrequency ablation with clipping of LA appendage via right mini thoracotomy approach   S/P minimally invasive mitral valve repair 03/07/2019   Complex valvuloplasty including triangular resection of middle scallop of posterior leaflet and 34 mm Sorin Memo 4D ring annuloplasty via right mini thoracotomy approach    Current Medications: Current Meds  Medication Sig   albuterol (PROVENTIL HFA;VENTOLIN HFA) 108 (90 Base) MCG/ACT inhaler Inhale 1-2 puffs into the lungs every 6 (six) hours as needed for wheezing or shortness of breath.  amoxicillin (AMOXIL) 500 MG capsule TAKE 4 CAPSULES BY MOUTH 45 MINUTES PRIOR TO DENTAL PROCEDURES CALL OFFICE FOR APPOINTMENT   aspirin EC 81 MG tablet Take 81 mg by mouth daily. Swallow whole.   esomeprazole (NEXIUM) 40 MG capsule Take 40 mg by mouth at bedtime.    finasteride (PROSCAR) 5 MG tablet Take 5 mg by mouth at bedtime.    fluorouracil (EFUDEX) 5 % cream Apply 1 application topically 2 (two) times daily as needed (skin spots).   fluticasone (FLONASE) 50  MCG/ACT nasal spray Place 1 spray into both nostrils daily as needed for allergies or rhinitis.    fluticasone-salmeterol (ADVAIR HFA) 230-21 MCG/ACT inhaler Inhale 2 puffs into the lungs 2 (two) times daily as needed (shortness of breath).   fluticasone-salmeterol (ADVAIR) 250-50 MCG/ACT AEPB Inhale 1 puff into the lungs as needed (shortness of breath).   metoprolol tartrate (LOPRESSOR) 25 MG tablet Take 0.5 tablets (12.5 mg total) by mouth daily as needed (atrial fibrillation).   montelukast (SINGULAIR) 10 MG tablet Take 10 mg by mouth daily.   pioglitazone (ACTOS) 15 MG tablet Take 15 mg by mouth daily.   rosuvastatin (CRESTOR) 40 MG tablet Take 40 mg by mouth daily.   sildenafil (REVATIO) 20 MG tablet Take 2-5 tablets by mouth as needed. Take 2-5 tablets on an empty stomach 1 hour prior to intercourse as needed   triamcinolone cream (KENALOG) 0.1 % Apply 1 application topically daily as needed (eczema). Mixed with eucerin      EKGs/Labs/Other Studies Reviewed:    The following studies were reviewed today:     EKG Interpretation Date/Time:  Monday March 21 2023 15:34:46 EDT Ventricular Rate:  81 PR Interval:  168 QRS Duration:  84 QT Interval:  404 QTC Calculation: 469 R Axis:   -18  Text Interpretation: Sinus rhythm with frequent Premature ventricular complexes When compared with ECG of 08-Mar-2019 07:15, Premature ventricular complexes are now Present T wave inversion no longer evident in Inferior leads QT has lengthened Confirmed by Norman Herrlich ([id number]) on 03/21/2023 3:38:10 PM   Recent Labs: No results found for requested labs within last 365 days.  Recent Lipid Panel    Component Value Date/Time   CHOL 145 02/03/2021 1609   TRIG 98 02/03/2021 1609   HDL 48 02/03/2021 1609   CHOLHDL 3.0 02/03/2021 1609   LDLCALC 79 02/03/2021 1609    Physical Exam:    VS:  BP 110/70 (BP Location: Right Arm, Patient Position: Sitting, Cuff Size: Normal)   Pulse 81   Ht 6\' 1"  (1.854  m)   Wt 177 lb (80.3 kg)   SpO2 95%   BMI 23.35 kg/m     Wt Readings from Last 3 Encounters:  03/21/23 177 lb (80.3 kg)  07/14/21 190 lb (86.2 kg)  02/03/21 189 lb (85.7 kg)     GEN:  Well nourished, well developed in no acute distress HEENT: Normal NECK: No JVD; No carotid bruits LYMPHATICS: No lymphadenopathy CARDIAC: No murmur RRR,  RESPIRATORY:  Clear to auscultation without rales, wheezing or rhonchi  ABDOMEN: Soft, non-tender, non-distended MUSCULOSKELETAL:  No edema; No deformity  SKIN: Warm and dry NEUROLOGIC:  Alert and oriented x 3 PSYCHIATRIC:  Normal affect    Signed, Norman Herrlich, MD  03/21/2023 4:27 PM     [facility]

## 2023-03-21 ENCOUNTER — Ambulatory Visit: Payer: Medicare Other | Attending: Cardiology

## 2023-03-21 ENCOUNTER — Encounter: Payer: Self-pay | Admitting: Cardiology

## 2023-03-21 ENCOUNTER — Ambulatory Visit: Payer: Medicare Other | Attending: Cardiology | Admitting: Cardiology

## 2023-03-21 VITALS — BP 110/70 | HR 81 | Ht 73.0 in | Wt 177.0 lb

## 2023-03-21 DIAGNOSIS — I471 Supraventricular tachycardia, unspecified: Secondary | ICD-10-CM

## 2023-03-21 DIAGNOSIS — Z8679 Personal history of other diseases of the circulatory system: Secondary | ICD-10-CM | POA: Insufficient documentation

## 2023-03-21 DIAGNOSIS — E785 Hyperlipidemia, unspecified: Secondary | ICD-10-CM | POA: Insufficient documentation

## 2023-03-21 DIAGNOSIS — Z9889 Other specified postprocedural states: Secondary | ICD-10-CM

## 2023-03-21 DIAGNOSIS — I493 Ventricular premature depolarization: Secondary | ICD-10-CM | POA: Insufficient documentation

## 2023-03-21 DIAGNOSIS — I480 Paroxysmal atrial fibrillation: Secondary | ICD-10-CM | POA: Insufficient documentation

## 2023-03-21 NOTE — Patient Instructions (Signed)
Medication Instructions:  Your physician recommends that you continue on your current medications as directed. Please refer to the Current Medication list given to you today.  *If you need a refill on your cardiac medications before your next appointment, please call your pharmacy*   Lab Work: None If you have labs (blood work) drawn today and your tests are completely normal, you will receive your results only by: MyChart Message (if you have MyChart) OR A paper copy in the mail If you have any lab test that is abnormal or we need to change your treatment, we will call you to review the results.   Testing/Procedures: A zio monitor was ordered today. It will remain on for 7 days. You will then return monitor and event diary in provided box. It takes 1-2 weeks for report to be downloaded and returned to [place]. We will call you with the results. If monitor falls off or has orange flashing light, please call Zio for further instructions.     Follow-Up: At [facility], you and your health needs are our priority.  As part of our continuing mission to provide you with exceptional heart care, we have created designated Provider Care Teams.  These Care Teams include your primary Cardiologist (physician) and Advanced Practice Providers (APPs -  Physician Assistants and Nurse Practitioners) who all work together to provide you with the care you need, when you need it.  We recommend signing up for the patient portal called "MyChart".  Sign up information is provided on this After Visit Summary.  MyChart is used to connect with patients for Virtual Visits (Telemedicine).  Patients are able to view lab/test results, encounter notes, upcoming appointments, etc.  Non-urgent messages can be sent to your provider as well.   To learn more about what you can do with MyChart, go to [email or web address].    Your next appointment:   3 month(s)  Provider:   Norman Herrlich, MD    Other  Instructions None

## 2023-03-27 DIAGNOSIS — Z9889 Other specified postprocedural states: Secondary | ICD-10-CM | POA: Diagnosis not present

## 2023-03-27 DIAGNOSIS — R002 Palpitations: Secondary | ICD-10-CM | POA: Diagnosis not present

## 2023-03-27 DIAGNOSIS — I493 Ventricular premature depolarization: Secondary | ICD-10-CM

## 2023-03-27 DIAGNOSIS — I361 Nonrheumatic tricuspid (valve) insufficiency: Secondary | ICD-10-CM | POA: Diagnosis not present

## 2023-03-27 DIAGNOSIS — E785 Hyperlipidemia, unspecified: Secondary | ICD-10-CM | POA: Diagnosis not present

## 2023-03-28 DIAGNOSIS — Z9889 Other specified postprocedural states: Secondary | ICD-10-CM | POA: Diagnosis not present

## 2023-03-28 DIAGNOSIS — I480 Paroxysmal atrial fibrillation: Secondary | ICD-10-CM | POA: Diagnosis not present

## 2023-03-28 DIAGNOSIS — E785 Hyperlipidemia, unspecified: Secondary | ICD-10-CM | POA: Diagnosis not present

## 2023-03-28 DIAGNOSIS — R002 Palpitations: Secondary | ICD-10-CM | POA: Diagnosis not present

## 2023-03-30 ENCOUNTER — Encounter: Payer: Self-pay | Admitting: Cardiology

## 2023-03-31 ENCOUNTER — Other Ambulatory Visit: Payer: Self-pay

## 2023-04-04 ENCOUNTER — Other Ambulatory Visit: Payer: Self-pay

## 2023-04-05 ENCOUNTER — Ambulatory Visit: Payer: Medicare Other | Attending: Cardiology | Admitting: Cardiology

## 2023-04-05 ENCOUNTER — Encounter: Payer: Self-pay | Admitting: Cardiology

## 2023-04-05 VITALS — BP 112/68 | HR 68 | Ht 73.0 in | Wt 177.0 lb

## 2023-04-05 DIAGNOSIS — Z8679 Personal history of other diseases of the circulatory system: Secondary | ICD-10-CM | POA: Diagnosis present

## 2023-04-05 DIAGNOSIS — Z9889 Other specified postprocedural states: Secondary | ICD-10-CM

## 2023-04-05 DIAGNOSIS — I493 Ventricular premature depolarization: Secondary | ICD-10-CM

## 2023-04-05 DIAGNOSIS — E785 Hyperlipidemia, unspecified: Secondary | ICD-10-CM

## 2023-04-05 DIAGNOSIS — I480 Paroxysmal atrial fibrillation: Secondary | ICD-10-CM

## 2023-04-05 NOTE — Progress Notes (Unsigned)
Cardiology Office Note:  .   Date:  04/06/2023  ID:  Alejandro Burnett, DOB May 11, 1951, MRN [id number] PCP: [name], MD  [facility] Providers Cardiologist:  Norman Herrlich, MD    History of Present Illness: .   Alejandro Burnett is a 72 y.o. male with a past medical history of MVP s/p MVR 03/07/19, PAF s/p MAZE 03/07/19, history of DVT, migraines, asthma, GERD, BPH, dyslipidemia.  Last evaluated by Dr. Dulce Sellar on 03/21/2023, was doing well from his mitral valve replacement.   Repeat echocardiogram was arranged and completed on 03/27/2023 revealing an EF of 50 to 55%, LA moderately dilated, trivial MR, mild mitral annular calcification, moderate TR.  He presented to [facility] with complaints of shortness of breath and palpitation.  Evaluated by Dr. Bing Matter during this admission, EKG was sinus with PVCs.  He was started on metoprolol tartrate 25 mg twice daily, troponins were negative, it was felt to be symptomatic PVCs.  Labs WNL at discharge.  He presents today for follow-up after his recent ED visit as outlined above.  He wore a monitor, this fell off after three days--he sweats profusely at work and said no reason to repeat monitor as rep from [facility] said it would not adhere --for palpitations, has not been officially resulted yet however it revealed an average heart rate of 74 bpm, predominant underlying rhythm was sinus, 1 run of V. tach lasting 6 beats, 3 episodes of SVT,frequent VE at 17.8%. 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 He avoids triggers for his palpitations. He denies chest pain, dyspnea, pnd, orthopnea, n, v, dizziness, syncope, edema, weight gain, or early satiety.   ROS: Review of Systems  Cardiovascular:  Positive for palpitations.  All other systems reviewed and are negative.    Studies Reviewed: .        Cardiac Studies & Procedures   CARDIAC CATHETERIZATION  CARDIAC  CATHETERIZATION 10/09/2018  Narrative 1.  Angiographically normal coronary arteries (right dominant) 2.  Low intracardiac filling pressures 3. Systemic hypotension during procedure related to conscious sedation - pt remained cognitively intact and stable throughout. BP normalized after sedation wore off.  Recommendations: The patient will continue preoperative evaluation for minimally invasive mitral valve repair.  He has normal coronary arteries.  He is incidentally noted to be in atrial fibrillation with mildly increased ventricular rate.  I will start him on a low-dose of metoprolol and he has scheduled follow-up with Dr. Cornelius Moras next week.  Will not start him on anticoagulation since he will have surgery in the near future requiring temporary discontinuation of anticoagulant drugs.  Findings Coronary Findings Diagnostic  Dominance: Right  Left Main Vessel is large.  Left Anterior Descending The LAD is a large vessel that reaches the apex.  The first diagonal is large and divides into twin vessels.  There is no stenosis throughout the LAD or diagonal branches.  Left Circumflex Vessel is moderate in size. Vessel is angiographically normal. The circumflex is patent without stenosis.  Right Coronary Artery Vessel is large. Vessel is angiographically normal. Large, dominant RCA without stenosis.  The PDA and PLA branches are widely patent.  Intervention  No interventions have been documented.     ECHOCARDIOGRAM  ECHOCARDIOGRAM COMPLETE 08/03/2021  Narrative ECHOCARDIOGRAM REPORT    Patient Name:   Alejandro Burnett Date of Exam: 08/03/2021 Medical Rec #:  [id number]        Height:  73.0 in Accession #:    [id number]       Weight:       190.0 lb Date of Birth:  April 30, 1951        BSA:          2.105 m Patient Age:    44 years         BP:           118/70 mmHg Patient Gender: M                HR:           64 bpm. Exam Location:  [place]  Procedure: 2D Echo, Cardiac  Doppler, Color Doppler and Strain Analysis  Indications:    S/P Mitral Valve repair Z98.89 Atrial Fibrillation I48.91  History:        Patient has prior history of Echocardiogram examinations, most recent 07/12/2019. Non-rheumatic mitral regurgitation, Arrythmias:Palpitation, Signs/Symptoms:Chest Pain; Risk Factors:Dyslipidemia and Pre-diabetes.  Mitral Valve: valve is present in the mitral position. Procedure Date: 03/06/2021 ? Minimally-Invasive Mitral Valve Repair.  Sonographer:    Louie Boston RDCS Referring Phys: [id number] BRIAN J MUNLEY  IMPRESSIONS   1. Left ventricular ejection fraction, by estimation, is 50 to 55%. The left ventricle has low normal function. The left ventricle has no regional wall motion abnormalities. There is mild concentric left ventricular hypertrophy. Left ventricular diastolic parameters are indeterminate. 2. Right ventricular systolic function is normal. The right ventricular size is normal. There is normal pulmonary artery systolic pressure. 3. Left atrial size was mildly dilated. 4. The mitral valve has been repaired. No evidence of mitral valve regurgitation. No evidence of mitral stenosis. There is a present in the mitral position. Procedure Date: 03/06/2021 ? Minimally-Invasive Mitral Valve Repair. 5. The aortic valve is tricuspid. Aortic valve regurgitation is trivial. No aortic stenosis is present. 6. There is mild dilatation of the ascending aorta, measuring 38 mm. 7. The inferior vena cava is normal in size with greater than 50% respiratory variability, suggesting right atrial pressure of 3 mmHg.  FINDINGS Left Ventricle: Left ventricular ejection fraction, by estimation, is 50 to 55%. The left ventricle has low normal function. The left ventricle has no regional wall motion abnormalities. Global longitudinal strain performed but not reported based on interpreter judgement due to suboptimal tracking. The left ventricular internal cavity size was  normal in size. There is mild concentric left ventricular hypertrophy. Left ventricular diastolic parameters are indeterminate.  Right Ventricle: The right ventricular size is normal. No increase in right ventricular wall thickness. Right ventricular systolic function is normal. There is normal pulmonary artery systolic pressure. The tricuspid regurgitant velocity is 2.62 m/s, and with an assumed right atrial pressure of 3 mmHg, the estimated right ventricular systolic pressure is 30.5 mmHg.  Left Atrium: Left atrial size was mildly dilated.  Right Atrium: Right atrial size was normal in size.  Pericardium: There is no evidence of pericardial effusion.  Mitral Valve: The mitral valve has been repaired/replaced. No evidence of mitral valve regurgitation. There is a present in the mitral position. Procedure Date: 03/06/2021 ? Minimally-Invasive Mitral Valve Repair. No evidence of mitral valve stenosis. MV peak gradient, 8.4 mmHg. The mean mitral valve gradient is 4.0 mmHg.  Tricuspid Valve: The tricuspid valve is normal in structure. Tricuspid valve regurgitation is mild . No evidence of tricuspid stenosis.  Aortic Valve: The aortic valve is tricuspid. Aortic valve regurgitation is trivial. No aortic stenosis is present.  Pulmonic Valve: The pulmonic valve was normal  in structure. Pulmonic valve regurgitation is mild. No evidence of pulmonic stenosis.  Aorta: The aortic root is normal in size and structure and the aortic arch was not well visualized. There is mild dilatation of the ascending aorta, measuring 38 mm.  Venous: The pulmonary veins were not well visualized. The inferior vena cava is normal in size with greater than 50% respiratory variability, suggesting right atrial pressure of 3 mmHg.  IAS/Shunts: No atrial level shunt detected by color flow Doppler.   LEFT VENTRICLE PLAX 2D LVIDd:         4.40 cm      Diastology LVIDs:         3.35 cm      LV e' medial:    5.33 cm/s LV PW:          1.10 cm      LV E/e' medial:  28.1 LV IVS:        1.20 cm      LV e' lateral:   9.41 cm/s LVOT diam:     2.20 cm      LV E/e' lateral: 15.9 LV SV:         62 LV SV Index:   29 LVOT Area:     3.80 cm  LV Volumes (MOD) LV vol d, MOD A2C: 92.4 ml LV vol d, MOD A4C: 110.0 ml LV vol s, MOD A2C: 44.8 ml LV vol s, MOD A4C: 60.2 ml LV SV MOD A2C:     47.6 ml LV SV MOD A4C:     110.0 ml LV SV MOD BP:      47.9 ml  RIGHT VENTRICLE            IVC RV S prime:     9.76 cm/s  IVC diam: 1.60 cm TAPSE (M-mode): 1.8 cm  LEFT ATRIUM             Index        RIGHT ATRIUM           Index LA diam:        4.40 cm 2.09 cm/m   RA Area:     18.70 cm LA Vol (A2C):   80.6 ml 38.29 ml/m  RA Volume:   52.50 ml  24.94 ml/m LA Vol (A4C):   76.1 ml 36.15 ml/m LA Biplane Vol: 80.8 ml 38.38 ml/m AORTIC VALVE             PULMONIC VALVE LVOT Vmax:   70.70 cm/s  PR End Diast Vel: 3.41 msec LVOT Vmean:  50.200 cm/s LVOT VTI:    0.162 m  AORTA Ao Root diam: 4.00 cm Ao Asc diam:  3.80 cm Ao Desc diam: 2.00 cm  MITRAL VALVE                TRICUSPID VALVE MV Area (PHT): 2.33 cm     TR Peak grad:   27.5 mmHg MV Area VTI:   1.38 cm     TR Vmax:        262.00 cm/s MV Peak grad:  8.4 mmHg MV Mean grad:  4.0 mmHg     SHUNTS MV Vmax:       1.45 m/s     Systemic VTI:  0.16 m MV Vmean:      88.6 cm/s    Systemic Diam: 2.20 cm MV Decel Time: 325 msec MV E velocity: 150.00 cm/s MV A velocity: 46.50 cm/s MV E/A ratio:  3.23  Norman Herrlich  MD Electronically signed by Norman Herrlich MD Signature Date/Time: 08/03/2021/4:22:41 PM    Final   TEE  ECHO INTRAOPERATIVE TEE 03/07/2019  Narrative *INTRAOPERATIVE TRANSESOPHAGEAL REPORT *    Patient Name:   Alejandro Burnett Date of Exam: 03/07/2019 Medical Rec #:  [id number]        Height:       73.0 in Accession #:    [id number]       Weight:       185.0 lb Date of Birth:  [birth year]        BSA:          2.08 m Patient Age:    68 years         BP:            119/70 mmHg Patient Gender: M                HR:           68 bpm. Exam Location:  Anesthesiology  Transesophogeal exam was perform intraoperatively during surgical procedure. Patient was closely monitored under general anesthesia during the entirety of examination.  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 Sonographer:     Tonia Ghent RDCS Performing Phys: Jairo Ben MD Diagnosing Phys: Jairo Ben MD  Complications: No known complications during this procedure. POST-OP IMPRESSIONS - Left Ventricle: The left ventricle is unchanged from pre-bypass. LV function remains low normal. There is septal flattening, resulting from pacing. Overall EF 45-50%, and appears to be improving with time off of CPB. - Aorta: The aorta appears unchanged from pre-bypass. - Left Atrial Appendage: Has been surgically closed and oversewn. - Aortic Valve: The aortic valve appears unchanged from pre-bypass. - Mitral Valve: No stenosis present, with mean gradient 1 mmHg. No regurgitation post repair. The gradient recorded across the repaired valve is within the expected range. - Tricuspid Valve: The tricuspid valve appears unchanged from pre-bypass. - Interatrial Septum: The interatrial septum appears unchanged from pre-bypass.  PRE-OP FINDINGS Left Ventricle: The left ventricle has low normal systolic function, with a visual ejection fraction of 50-55%. Simpson's 47%. The cavity size was mildly dilated. There is no increase in left ventricular wall thickness. There are no regional wall motion abnormalities.  Right Ventricle: The right ventricle has normal systolic function. The cavity was mildly enlarged. There is no increase in right ventricular wall thickness.  Left Atrium: Left atrial size was somewhat dilated. The left atrial  appendage is well visualized and there is no evidence of thrombus present. Left atrial appendage velocity is normal at greater than 40 cm/s.  Right Atrium: Right atrial size was mildly dilated. Right atrial pressure is estimated at 10 mmHg. the interatrial septum is seen bowed toward the left, consistent with elevated right atrial pressures.  Interatrial Septum: No atrial level shunt detected by color flow Doppler. There is left bowing of the interatrial septum, suggestive of elevated right atrial pressure.  Pericardium: The pericardium was not assessed.  Mitral Valve: The mitral valve is myxomatous. Mild thickening of the mitral valve leaflet. No calcification of the mitral valve leaflet. Mitral valve regurgitation is severe by color flow Doppler. The MR jet is anteriorly-directed., with chordal involvment There is no evidence of mitral valve vegetation. Pulmonary venous flow is normal. 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  Tricuspid Valve: The tricuspid valve was normal in structure. Tricuspid valve regurgitation is very mild by color flow Doppler. The jet is directed toward the atrial septum. No TV vegetation was visualized.  Aortic Valve: The aortic valve is tricuspid, without thickening or calcification. Aortic valve regurgitation is trivial by color flow Doppler. There is no evidence of aortic valve stenosis, peak gradient 5 mmHg, mean gradient 3 mmHg. There is no evidence of a vegetation on the aortic valve.  Pulmonic Valve: The pulmonic valve was normal in structure, with normal function. No evidence of pulmonic stenosis. Pulmonic valve regurgitation is trivial, around the PA catheter, by color flow Doppler.   Aorta: The aortic root and ascending aorta are normal in size and structure.  Venous: The inferior vena cava is normal in size  with less than 50% respiratory variability.  +--------------+-------++ LEFT VENTRICLE        +--------------+-------++ PLAX 2D               +--------------+-------++ LVIDd:        5.10 cm +--------------+-------++ LVIDs:        3.50 cm +--------------+-------++ LV SV:        73 ml   +--------------+-------++ LV SV Index:  35.02   +--------------+-------++                       +--------------+-------++  +------------------+---------++ LV Volumes (MOD)            +------------------+---------++ LV area d, A4C:   42.90 cm +------------------+---------++ LV area s, A4C:   27.50 cm +------------------+---------++ LV major d, A4C:  8.87 cm   +------------------+---------++ LV major s, A4C:  7.17 cm   +------------------+---------++ LV vol d, MOD A4C:168.0 ml  +------------------+---------++ LV vol s, MOD A4C:89.2 ml   +------------------+---------++ LV SV MOD A4C:    168.0 ml  +------------------+---------++  +-------------+-----------++ AORTIC VALVE             +-------------+-----------++ AV Vmax:     107.00 cm/s +-------------+-----------++ AV Vmean:    74.400 cm/s +-------------+-----------++ AV VTI:      0.237 m     +-------------+-----------++ AV Peak Grad:4.6 mmHg    +-------------+-----------++ AV Mean Grad:3.0 mmHg    +-------------+-----------++  +-------------+---------++ MITRAL VALVE           +-------------+---------++ MV Peak grad:3.1 mmHg  +-------------+---------++ MV Mean grad:1.0 mmHg  +-------------+---------++ MV Vmax:     0.88 m/s  +-------------+---------++ MV Vmean:    45.9 cm/s +-------------+---------++ MV VTI:      0.27 m    +-------------+---------++ +-------------+-----------++ MR Peak grad:62.3 mmHg   +-------------+-----------++ MR Vmax:     394.50 cm/s +-------------+-----------++   Jairo Ben  MD Electronically signed by Jairo Ben MD Signature Date/Time: 03/07/2019/5:08:33 PM    Final   MONITORS  LONG TERM MONITOR (3-14 DAYS) 09/05/2019  Narrative A ZIO monitor was applied 13 days 23 hours in order to assess for atrial fibrillation following mitral valve repair complicated by perioperative atrial fibrillation.  The rhythm throughout was sinus with minimum average and maximum heart rates of 61, 92 and 161 bpm.  No pauses of 3 seconds or greater and no episodes of sinus node or AV nodal block.  Ventricular ectopy overall was rare however there were 54 couplets 2 triplets and 10 runs of PVCs the longest an episode of  nonsustained ventricular tachycardia 14 complexes at a rate of 134 bpm.  Supraventricular ectopy was rare.  There were no episodes of atrial fibrillation or flutter.  There were 11 brief runs of atrial tachycardia the longest and complexes at a rate of 113 bpm.  There were 2 triggered events unassociated with arrhythmia   Conclusion, complex ventricular arrhythmia couplets, triplets and nonsustained ventricular tachycardia the longest 14 complexes and duration.           Risk Assessment/Calculations:    CHA2DS2-VASc Score = 1   This indicates a 0.6% annual risk of stroke. The patient's score is based upon: CHF History: 0 HTN History: 0 Diabetes History: 0 Stroke History: 0 Vascular Disease History: 0 Age Score: 1 Gender Score: 0             Physical Exam:   VS:  BP 112/68 (BP Location: Right Arm, Patient Position: Sitting, Cuff Size: Normal)   Pulse 68   Ht 6\' 1"  (1.854 m)   Wt 177 lb (80.3 kg)   SpO2 93%   BMI 23.35 kg/m    Wt Readings from Last 3 Encounters:  04/05/23 177 lb (80.3 kg)  03/21/23 177 lb (80.3 kg)  07/14/21 190 lb (86.2 kg)    GEN: Well nourished, well developed in no acute distress NECK: No JVD; No carotid bruits CARDIAC: RRR, no murmurs, rubs, gallops RESPIRATORY:  Clear to auscultation without rales, wheezing or  rhonchi  ABDOMEN: Soft, non-tender, non-distended EXTREMITIES:  No edema; No deformity   ASSESSMENT AND PLAN: .   Atrial fibrillation - s/p MAZE procedure 2020, recent monitor did not reveal any episodes of AF. Continue metoprolol 25 mg twice daily.  Palpitations/Ventricular ectopy - burden of 16%, will refer to EP to see if antiarrhythmic therapy is warranted. Will repeat TSH and Magnesium for contributory causes. Other labs reviewed from [facility] and were unremarkable. Repeat echo on 03/27/23 EF of 50 to 55%, LA moderately dilated, trivial MR, mild mitral annular calcification, moderate TR. MVR - s/p 03/07/2019, most recent echo on 03/27/23 trivial MR, mild mitral annular calcification. Continue with SBE.  Hyperlipidemia - not clear who manages this, will discuss at next OV, recent LDL on file for review was well controlled.        Dispo: Refer to EP for high VE burden, check TSH and Mag.   Signed, Flossie Dibble, NP

## 2023-04-05 NOTE — Patient Instructions (Signed)
Medication Instructions:  Your physician recommends that you continue on your current medications as directed. Please refer to the Current Medication list given to you today.  *If you need a refill on your cardiac medications before your next appointment, please call your pharmacy*   Lab Work: Your physician recommends that you return for lab work in: Today for Magnesium and TSH  If you have labs (blood work) drawn today and your tests are completely normal, you will receive your results only by: MyChart Message (if you have MyChart) OR A paper copy in the mail If you have any lab test that is abnormal or we need to change your treatment, we will call you to review the results.   Testing/Procedures: NONE   Follow-Up: At [facility], you and your health needs are our priority.  As part of our continuing mission to provide you with exceptional heart care, we have created designated Provider Care Teams.  These Care Teams include your primary Cardiologist (physician) and Advanced Practice Providers (APPs -  Physician Assistants and Nurse Practitioners) who all work together to provide you with the care you need, when you need it.  We recommend signing up for the patient portal called "MyChart".  Sign up information is provided on this After Visit Summary.  MyChart is used to connect with patients for Virtual Visits (Telemedicine).  Patients are able to view lab/test results, encounter notes, upcoming appointments, etc.  Non-urgent messages can be sent to your provider as well.   To learn more about what you can do with MyChart, go to [email or web address].    Your next appointment:   6 month(s)  Provider:   Norman Herrlich, MD    Other Instructions

## 2023-04-06 ENCOUNTER — Telehealth: Payer: Self-pay

## 2023-04-06 LAB — MAGNESIUM: Magnesium: 2.2 mg/dL (ref 1.6–2.3)

## 2023-04-06 LAB — TSH: TSH: 1.27 u[IU]/mL (ref 0.450–4.500)

## 2023-04-06 NOTE — Telephone Encounter (Signed)
LM to return my call. 

## 2023-04-06 NOTE — Telephone Encounter (Signed)
-----   Message from Flossie Dibble sent at 04/06/2023  1:40 PM EDT ----- Magnesium normal, thyroid normal.

## 2023-04-07 ENCOUNTER — Telehealth: Payer: Self-pay | Admitting: Cardiology

## 2023-04-07 ENCOUNTER — Telehealth: Payer: Self-pay

## 2023-04-07 DIAGNOSIS — I471 Supraventricular tachycardia, unspecified: Secondary | ICD-10-CM | POA: Diagnosis not present

## 2023-04-07 NOTE — Telephone Encounter (Signed)
-----   Message from Flossie Dibble sent at 04/06/2023  1:40 PM EDT ----- Magnesium normal, thyroid normal.

## 2023-04-07 NOTE — Telephone Encounter (Signed)
LM to return my call. 

## 2023-04-07 NOTE — Telephone Encounter (Signed)
Patient informed of results.  

## 2023-04-07 NOTE — Telephone Encounter (Signed)
Pt returning call from earlier regarding test results. Please advise

## 2023-05-20 ENCOUNTER — Other Ambulatory Visit: Payer: Self-pay | Admitting: Cardiology

## 2023-06-20 ENCOUNTER — Ambulatory Visit: Payer: Medicare Other | Attending: Cardiology | Admitting: Cardiology

## 2023-06-20 ENCOUNTER — Encounter: Payer: Self-pay | Admitting: Cardiology

## 2023-06-20 VITALS — BP 114/70 | HR 74 | Ht 73.0 in | Wt 178.6 lb

## 2023-06-20 DIAGNOSIS — I493 Ventricular premature depolarization: Secondary | ICD-10-CM | POA: Diagnosis not present

## 2023-06-20 DIAGNOSIS — I4819 Other persistent atrial fibrillation: Secondary | ICD-10-CM | POA: Insufficient documentation

## 2023-06-20 NOTE — Patient Instructions (Addendum)
Medication Instructions:  Your physician recommends that you continue on your current medications as directed. Please refer to the Current Medication list given to you today.  *If you need a refill on your cardiac medications before your next appointment, please call your pharmacy*   Lab Work: None ordered If you have labs (blood work) drawn today and your tests are completely normal, you will receive your results only by: MyChart Message (if you have MyChart) OR A paper copy in the mail If you have any lab test that is abnormal or we need to change your treatment, we will call you to review the results.   Testing/Procedures: Your physician has recommended that you wear a 1 week holter monitor in 5 months. Holter monitors are medical devices that record the heart's electrical activity. Doctors most often use these monitors to diagnose arrhythmias. Arrhythmias are problems with the speed or rhythm of the heartbeat. The monitor is a small, portable device. You can wear one while you do your normal daily activities. This is usually used to diagnose what is causing palpitations/syncope (passing out).    Follow-Up: At [facility], you and your health needs are our priority.  As part of our continuing mission to provide you with exceptional heart care, we have created designated Provider Care Teams.  These Care Teams include your primary Cardiologist (physician) and Advanced Practice Providers (APPs -  Physician Assistants and Nurse Practitioners) who all work together to provide you with the care you need, when you need it.  We recommend signing up for the patient portal called "MyChart".  Sign up information is provided on this After Visit Summary.  MyChart is used to connect with patients for Virtual Visits (Telemedicine).  Patients are able to view lab/test results, encounter notes, upcoming appointments, etc.  Non-urgent messages can be sent to your provider as well.   To learn more about  what you can do with MyChart, go to [email or web address].    Your next appointment:   6 month(s)  The format for your next appointment:   In Person  Provider:   Loman Brooklyn, MD    Thank you for choosing [facility]!!   Dory Horn, RN [phone]

## 2023-06-20 NOTE — Progress Notes (Signed)
Electrophysiology Office Note:   Date:  06/20/2023  ID:  Alejandro, Burnett 10/04/1950, MRN [id number]  Primary Cardiologist: Alejandro Herrlich, MD Electrophysiologist: Alejandro Lemming, MD      History of Present Illness:   Alejandro Burnett is a 72 y.o. male with h/o mitral valve prolapse postrepair 03/07/2019, atrial fibrillation post maze 03/07/2019, DVT, hyperlipidemia, PVCs seen today for  for Electrophysiology evaluation of PVCs at the request of Alejandro Burnett.    He presented to Alejandro Burnett with shortness of breath and palpitations.  He was found to have an elevated PVC burden.  He was started on metoprolol.  He wore a cardiac monitor that showed a 16.8% PVC burden.  Discussed the use of AI scribe software for clinical note transcription with the patient, who gave verbal consent to proceed.  History of Present Illness   The patient, with a history of atrial fibrillation, presents for evaluation of PVCs. He denies palpitations or any subjective awareness of the PVCs. He initially presented to the ER with a feeling of 'something wrong' and was found to have 'extra beats.' At that time, he was consuming a significant amount of caffeine in the form of zero-calorie Dr. Reino Kent and coffee. He also has a history of prediabetes, which he manages with diet, specifically by avoiding sugary drinks like Sprite and sweet tea.  This resulted in a resolution of his symptoms but caused him to feel unwell. He reports occasional PVCs when stressed, but otherwise feels well.       Review of systems complete and found to be negative unless listed in HPI.   EP Information / Studies Reviewed:    EKG is not ordered today. EKG from 03/27/23 reviewed which showed sinus rhythm, PVCs        Risk Assessment/Calculations:              Physical Exam:   VS:  BP 114/70   Pulse 74   Ht 6\' 1"  (1.854 m)   Wt 178 lb 9.6 oz (81 kg)   SpO2 97%   BMI 23.56 kg/m    Wt Readings from Last 3 Encounters:   06/20/23 178 lb 9.6 oz (81 kg)  04/05/23 177 lb (80.3 kg)  03/21/23 177 lb (80.3 kg)     GEN: Well nourished, well developed in no acute distress NECK: No JVD; No carotid bruits CARDIAC: Regular rate and rhythm, no murmurs, rubs, gallops RESPIRATORY:  Clear to auscultation without rales, wheezing or rhonchi  ABDOMEN: Soft, non-tender, non-distended EXTREMITIES:  No edema; No deformity   ASSESSMENT AND PLAN:    1.  Persistent atrial fibrillation: Post maze in 2020.  Continue metoprolol per primary cardiology  2.  PVCs: 16.8% burden on cardiac monitor.  No PVCs on exam today.  He feels that reducing his caffeine intake is significantly improved his PVC burden.  Alejandro Burnett see him back in 6 months with a monitor prior to that.  Aside from that, we Alejandro Burnett continue with current management.  3.  Post MVR: Stable on echoes.  Plan per primary cardiology  Follow up with Alejandro Burnett in 6 months  Signed, Alejandro Haroon Jorja Loa, MD

## 2023-06-21 ENCOUNTER — Ambulatory Visit: Payer: Medicare Other | Admitting: Cardiology

## 2023-08-22 ENCOUNTER — Other Ambulatory Visit: Payer: Self-pay | Admitting: Cardiology

## 2023-08-26 ENCOUNTER — Other Ambulatory Visit: Payer: Self-pay | Admitting: Cardiology

## 2023-08-26 ENCOUNTER — Telehealth: Payer: Self-pay | Admitting: Cardiology

## 2023-08-26 MED ORDER — METOPROLOL TARTRATE 25 MG PO TABS: 12.5000 mg | ORAL_TABLET | Freq: Two times a day (BID) | ORAL | 2 refills | Status: DC

## 2023-08-26 NOTE — Telephone Encounter (Signed)
 Patient is needing a refill on his metoprol 25mg  sent to [facility], only has enough to last him til Sunday. CB # [phone]

## 2023-09-25 NOTE — Progress Notes (Unsigned)
Cardiology Office Note:    Date:  09/25/2023   ID:  Alejandro Burnett, DOB 05-11-1951, MRN [id number]  PCP:  Alejandro Palma., MD  Cardiologist:  Alejandro Herrlich, MD    Referring MD: Alejandro Palma., MD    ASSESSMENT:    1. PVC's (premature ventricular contractions)   2. S/P mitral valve repair   3. S/P Maze operation for atrial fibrillation   4. Dyslipidemia    PLAN:    In order of problems listed above:  Recently had frequent PVCs saw EP was improved there was malfunction of his event monitor reapplied today. He has had a good result from mitral valve repair and surgical maze looking at the records at [facility] though his discharge diagnosis was atrial fibrillation it was not documented.  I do not think he requires a repeat echo this year He will continue his statin he will be making a new patient appointment with PCP in [place] he will have his labs checked for lipid profile there continue his minimum dose of beta-blocker and asked him to add over-the-counter magnesium they can be beneficial and avoid proarrhythmic drugs   Next appointment: I will see him in 1 year   Medication Adjustments/Labs and Tests Ordered: Current medicines are reviewed at length with the patient today.  Concerns regarding medicines are outlined above.  No orders of the defined types were placed in this encounter.  No orders of the defined types were placed in this encounter.    History of Present Illness:    Alejandro Burnett is a 73 y.o. male with a hx of mitral valve prolapse with severe mitral regurgitation and mitral valve replacement 03/07/2019 paroxysmal atrial fibrillation with surgical Maze procedure at the time of valve surgery dyslipidemia and most recently frequent PVCs last seen Dr. Elliot Gurney nurse practitioner August 2024.  Seen by EP Dr. Elberta Fortis with symptomatic frequent PVCs with PVC burden of 16.8%\  An EKG at [facility] 03/27/2023 showing sinus rhythm with PVCs.He was a [facility] discharge 03/28/2023 with a diagnosis of atrial fibrillation.  He had an EKG showing sinus rhythm with PVCs although his discharge diagnoses atrial fibrillation it was never documented.  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 Delphina Cahill practice  Past Medical History:  Diagnosis Date   Acute venous embolism and thrombosis of deep vessels of proximal lower extremity (HCC) 03/16/2019   Asthma    Asthma, chronic 04/17/2014   Atrial fibrillation (HCC) 03/16/2019   Benign prostatic hyperplasia with urinary obstruction    BPH (benign prostatic hyperplasia)    Chest pain 08/08/2018   Coughing blood 08/25/2018   Dyslipidemia    Early cataracts, bilateral    Gastroesophageal reflux disease    Hematuria 04/17/2014   Microscopic   Hyperlipemia    Long term (current) use of anticoagulants 03/16/2019   Migraine headache    Migraines    Mitral regurgitation    Mitral valve prolapse    Nephrolithiasis 11/04/2015   Non-rheumatic mitral regurgitation    Normal coronary arteries    Palpitation 05/15/2018   Paroxysmal atrial  fibrillation (HCC)    Pre-diabetes    S/P minimally invasive maze operation for atrial fibrillation 03/07/2019   Complete bilateral atrial lesion set using cryothermy and bipolar radiofrequency ablation with clipping of LA appendage via right mini thoracotomy approach   S/P minimally invasive mitral valve repair 03/07/2019   Complex valvuloplasty including triangular resection of middle scallop of posterior leaflet and 34 mm Sorin Memo 4D ring annuloplasty via right mini thoracotomy approach     Current Medications: Current Meds  Medication Sig   rosuvastatin (CRESTOR) 40 MG tablet Take 40 mg by mouth daily.      EKGs/Labs/Other Studies Reviewed:    The following studies were reviewed today:  Cardiac Studies & Procedures   CARDIAC CATHETERIZATION  CARDIAC CATHETERIZATION 10/09/2018  Narrative 1.  Angiographically normal coronary arteries (right dominant) 2.  Low intracardiac filling pressures 3. Systemic hypotension during procedure related to conscious sedation - pt remained cognitively intact and stable throughout. BP normalized after sedation wore off.  Recommendations: The patient will continue preoperative evaluation for minimally invasive mitral valve repair.  He has normal coronary arteries.  He is incidentally noted to be in atrial fibrillation with mildly increased ventricular rate.  I will start him on a low-dose of metoprolol and he has scheduled follow-up with Dr. Cornelius Moras next week.  Will not start him on anticoagulation since he will have surgery in the near future requiring temporary discontinuation of anticoagulant drugs.  Findings Coronary Findings Diagnostic  Dominance: Right  Left Main Vessel is large.  Left Anterior Descending The LAD is a large vessel that reaches the apex.  The first diagonal is large and divides into twin vessels.  There is no stenosis throughout the LAD or diagonal branches.  Left Circumflex Vessel is moderate in size. Vessel is angiographically normal. The circumflex is patent without stenosis.  Right Coronary Artery Vessel is large. Vessel is angiographically normal. Large, dominant RCA without stenosis.  The PDA and PLA branches are widely patent.  Intervention  No interventions have been documented.    ECHOCARDIOGRAM  ECHOCARDIOGRAM COMPLETE 08/03/2021  Narrative ECHOCARDIOGRAM REPORT    Patient Name:   Alejandro Burnett Date of Exam: 08/03/2021 Medical Rec #:  [id number]        Height:       73.0 in Accession  #:    [id number]       Weight:       190.0 lb Date of Birth:  05-Jul-1951        BSA:          2.105 m Patient Age:    70 years         BP:           118/70 mmHg Patient Gender: M                HR:           64 bpm. Exam Location:  [place]  Procedure: 2D Echo, Cardiac Doppler, Color Doppler and Strain Analysis  Indications:    S/P Mitral Valve repair Z98.89 Atrial Fibrillation I48.91  History:        Patient has prior history of Echocardiogram examinations, most recent 07/12/2019. Non-rheumatic mitral regurgitation, Arrythmias:Palpitation, Signs/Symptoms:Chest Pain; Risk Factors:Dyslipidemia and Pre-diabetes.  Mitral Valve: valve is present in the mitral position. Procedure Date: 03/06/2021 ? Minimally-Invasive Mitral Valve Repair.  Sonographer:    Louie Boston RDCS Referring Phys: [id number] Kymberly Blomberg J Evvie Behrmann  IMPRESSIONS   1. Left ventricular ejection fraction, by estimation, is  50 to 55%. The left ventricle has low normal function. The left ventricle has no regional wall motion abnormalities. There is mild concentric left ventricular hypertrophy. Left ventricular diastolic parameters are indeterminate. 2. Right ventricular systolic function is normal. The right ventricular size is normal. There is normal pulmonary artery systolic pressure. 3. Left atrial size was mildly dilated. 4. The mitral valve has been repaired. No evidence of mitral valve regurgitation. No evidence of mitral stenosis. There is a present in the mitral position. Procedure Date: 03/06/2021 ? Minimally-Invasive Mitral Valve Repair. 5. The aortic valve is tricuspid. Aortic valve regurgitation is trivial. No aortic stenosis is present. 6. There is mild dilatation of the ascending aorta, measuring 38 mm. 7. The inferior vena cava is normal in size with greater than 50% respiratory variability, suggesting right atrial pressure of 3 mmHg.  FINDINGS Left Ventricle: Left ventricular ejection fraction, by estimation, is 50 to  55%. The left ventricle has low normal function. The left ventricle has no regional wall motion abnormalities. Global longitudinal strain performed but not reported based on interpreter judgement due to suboptimal tracking. The left ventricular internal cavity size was normal in size. There is mild concentric left ventricular hypertrophy. Left ventricular diastolic parameters are indeterminate.  Right Ventricle: The right ventricular size is normal. No increase in right ventricular wall thickness. Right ventricular systolic function is normal. There is normal pulmonary artery systolic pressure. The tricuspid regurgitant velocity is 2.62 m/s, and with an assumed right atrial pressure of 3 mmHg, the estimated right ventricular systolic pressure is 30.5 mmHg.  Left Atrium: Left atrial size was mildly dilated.  Right Atrium: Right atrial size was normal in size.  Pericardium: There is no evidence of pericardial effusion.  Mitral Valve: The mitral valve has been repaired/replaced. No evidence of mitral valve regurgitation. There is a present in the mitral position. Procedure Date: 03/06/2021 ? Minimally-Invasive Mitral Valve Repair. No evidence of mitral valve stenosis. MV peak gradient, 8.4 mmHg. The mean mitral valve gradient is 4.0 mmHg.  Tricuspid Valve: The tricuspid valve is normal in structure. Tricuspid valve regurgitation is mild . No evidence of tricuspid stenosis.  Aortic Valve: The aortic valve is tricuspid. Aortic valve regurgitation is trivial. No aortic stenosis is present.  Pulmonic Valve: The pulmonic valve was normal in structure. Pulmonic valve regurgitation is mild. No evidence of pulmonic stenosis.  Aorta: The aortic root is normal in size and structure and the aortic arch was not well visualized. There is mild dilatation of the ascending aorta, measuring 38 mm.  Venous: The pulmonary veins were not well visualized. The inferior vena cava is normal in size with greater than 50%  respiratory variability, suggesting right atrial pressure of 3 mmHg.  IAS/Shunts: No atrial level shunt detected by color flow Doppler.   LEFT VENTRICLE PLAX 2D LVIDd:         4.40 cm      Diastology LVIDs:         3.35 cm      LV e' medial:    5.33 cm/s LV PW:         1.10 cm      LV E/e' medial:  28.1 LV IVS:        1.20 cm      LV e' lateral:   9.41 cm/s LVOT diam:     2.20 cm      LV E/e' lateral: 15.9 LV SV:         62 LV SV Index:  29 LVOT Area:     3.80 cm  LV Volumes (MOD) LV vol d, MOD A2C: 92.4 ml LV vol d, MOD A4C: 110.0 ml LV vol s, MOD A2C: 44.8 ml LV vol s, MOD A4C: 60.2 ml LV SV MOD A2C:     47.6 ml LV SV MOD A4C:     110.0 ml LV SV MOD BP:      47.9 ml  RIGHT VENTRICLE            IVC RV S prime:     9.76 cm/s  IVC diam: 1.60 cm TAPSE (M-mode): 1.8 cm  LEFT ATRIUM             Index        RIGHT ATRIUM           Index LA diam:        4.40 cm 2.09 cm/m   RA Area:     18.70 cm LA Vol (A2C):   80.6 ml 38.29 ml/m  RA Volume:   52.50 ml  24.94 ml/m LA Vol (A4C):   76.1 ml 36.15 ml/m LA Biplane Vol: 80.8 ml 38.38 ml/m AORTIC VALVE             PULMONIC VALVE LVOT Vmax:   70.70 cm/s  PR End Diast Vel: 3.41 msec LVOT Vmean:  50.200 cm/s LVOT VTI:    0.162 m  AORTA Ao Root diam: 4.00 cm Ao Asc diam:  3.80 cm Ao Desc diam: 2.00 cm  MITRAL VALVE                TRICUSPID VALVE MV Area (PHT): 2.33 cm     TR Peak grad:   27.5 mmHg MV Area VTI:   1.38 cm     TR Vmax:        262.00 cm/s MV Peak grad:  8.4 mmHg MV Mean grad:  4.0 mmHg     SHUNTS MV Vmax:       1.45 m/s     Systemic VTI:  0.16 m MV Vmean:      88.6 cm/s    Systemic Diam: 2.20 cm MV Decel Time: 325 msec MV E velocity: 150.00 cm/s MV A velocity: 46.50 cm/s MV E/A ratio:  3.23  Alejandro Herrlich MD Electronically signed by Alejandro Herrlich MD Signature Date/Time: 08/03/2021/4:22:41 PM    Final  TEE  ECHO INTRAOPERATIVE TEE 03/07/2019  Narrative *INTRAOPERATIVE TRANSESOPHAGEAL REPORT  *    Patient Name:   Alejandro Burnett Date of Exam: 03/07/2019 Medical Rec #:  [id number]        Height:       73.0 in Accession #:    [id number]       Weight:       185.0 lb Date of Birth:  [birth year]        BSA:          2.08 m Patient Age:    68 years         BP:           119/70 mmHg Patient Gender: M                HR:           68 bpm. Exam Location:  Anesthesiology  Transesophogeal exam was perform intraoperatively during surgical procedure. Patient was closely monitored under general anesthesia during the entirety of examination.  Indications:     Mitral Regurgitation History:  Atrial Fibrillation Mitral Valve Disease and Mitral Valve Prolapse Signs/Symptoms: Chest Pain Risk Factors: Dyslipidemia and Former Smoker. Sonographer:     Tonia Ghent RDCS Performing Phys: Jairo Ben MD Diagnosing Phys: Jairo Ben MD  Complications: No known complications during this procedure. POST-OP IMPRESSIONS - Left Ventricle: The left ventricle is unchanged from pre-bypass. LV function remains low normal. There is septal flattening, resulting from pacing. Overall EF 45-50%, and appears to be improving with time off of CPB. - Aorta: The aorta appears unchanged from pre-bypass. - Left Atrial Appendage: Has been surgically closed and oversewn. - Aortic Valve: The aortic valve appears unchanged from pre-bypass. - Mitral Valve: No stenosis present, with mean gradient 1 mmHg. No regurgitation post repair. The gradient recorded across the repaired valve is within the expected range. - Tricuspid Valve: The tricuspid valve appears unchanged from pre-bypass. - Interatrial Septum: The interatrial septum appears unchanged from pre-bypass.  PRE-OP FINDINGS Left Ventricle: The left ventricle has low normal systolic function, with a visual ejection fraction of 50-55%. Simpson's 47%. The cavity size was mildly dilated. There is no increase in left ventricular wall thickness. There  are no regional wall motion abnormalities.  Right Ventricle: The right ventricle has normal systolic function. The cavity was mildly enlarged. There is no increase in right ventricular wall thickness.  Left Atrium: Left atrial size was somewhat dilated. The left atrial appendage is well visualized and there is no evidence of thrombus present. Left atrial appendage velocity is normal at greater than 40 cm/s.  Right Atrium: Right atrial size was mildly dilated. Right atrial pressure is estimated at 10 mmHg. the interatrial septum is seen bowed toward the left, consistent with elevated right atrial pressures.  Interatrial Septum: No atrial level shunt detected by color flow Doppler. There is left bowing of the interatrial septum, suggestive of elevated right atrial pressure.  Pericardium: The pericardium was not assessed.  Mitral Valve: The mitral valve is myxomatous. Mild thickening of the mitral valve leaflet. No calcification of the mitral valve leaflet. Mitral valve regurgitation is severe by color flow Doppler. The MR jet is anteriorly-directed., with chordal involvment There is no evidence of mitral valve vegetation. Pulmonary venous flow is normal. There is severe prolapse of of the P2 segment of the posterior mitral leaflet, resulting in the anteriorly directed MR There ppears to be a single torn chordae tendonae to that P2 segment.  Tricuspid Valve: The tricuspid valve was normal in structure. Tricuspid valve regurgitation is very mild by color flow Doppler. The jet is directed toward the atrial septum. No TV vegetation was visualized.  Aortic Valve: The aortic valve is tricuspid, without thickening or calcification. Aortic valve regurgitation is trivial by color flow Doppler. There is no evidence of aortic valve stenosis, peak gradient 5 mmHg, mean gradient 3 mmHg. There is no evidence of a vegetation on the aortic valve.  Pulmonic Valve: The pulmonic valve was normal in structure, with  normal function. No evidence of pulmonic stenosis. Pulmonic valve regurgitation is trivial, around the PA catheter, by color flow Doppler.   Aorta: The aortic root and ascending aorta are normal in size and structure.  Venous: The inferior vena cava is normal in size with less than 50% respiratory variability.  +--------------+-------++ LEFT VENTRICLE        +--------------+-------++ PLAX 2D               +--------------+-------++ LVIDd:        5.10 cm +--------------+-------++ LVIDs:  3.50 cm +--------------+-------++ LV SV:        73 ml   +--------------+-------++ LV SV Index:  35.02   +--------------+-------++                       +--------------+-------++  +------------------+---------++ LV Volumes (MOD)            +------------------+---------++ LV area d, A4C:   42.90 cm +------------------+---------++ LV area s, A4C:   27.50 cm +------------------+---------++ LV major d, A4C:  8.87 cm   +------------------+---------++ LV major s, A4C:  7.17 cm   +------------------+---------++ LV vol d, MOD A4C:168.0 ml  +------------------+---------++ LV vol s, MOD A4C:89.2 ml   +------------------+---------++ LV SV MOD A4C:    168.0 ml  +------------------+---------++  +-------------+-----------++ AORTIC VALVE             +-------------+-----------++ AV Vmax:     107.00 cm/s +-------------+-----------++ AV Vmean:    74.400 cm/s +-------------+-----------++ AV VTI:      0.237 m     +-------------+-----------++ AV Peak Grad:4.6 mmHg    +-------------+-----------++ AV Mean Grad:3.0 mmHg    +-------------+-----------++  +-------------+---------++ MITRAL VALVE           +-------------+---------++ MV Peak grad:3.1 mmHg  +-------------+---------++ MV Mean grad:1.0 mmHg  +-------------+---------++ MV Vmax:     0.88 m/s  +-------------+---------++ MV Vmean:     45.9 cm/s +-------------+---------++ MV VTI:      0.27 m    +-------------+---------++ +-------------+-----------++ MR Peak grad:62.3 mmHg   +-------------+-----------++ MR Vmax:     394.50 cm/s +-------------+-----------++   Jairo Ben MD Electronically signed by Jairo Ben MD Signature Date/Time: 03/07/2019/5:08:33 PM    Final  MONITORS  LONG TERM MONITOR (3-14 DAYS) 04/05/2023  Narrative Patch Wear Time:  3 days and 5 hours (2024-07-29T16:08:39-0400 to 2024-08-01T21:38:15-0400)  Patient had a min HR of 49 bpm, max HR of 141 bpm, and avg HR of 74 bpm. Predominant underlying rhythm was Sinus Rhythm. First Degree AV Block was present.  There were no triggered or diary events.  There were no sinus pauses of 3 seconds or greater and no episodes of second or third-degree AV nodal block.  1 run of Ventricular Tachycardia occurred lasting 6 beats with a max rate of 141 bpm (avg 120 bpm).  3 Supraventricular Tachycardia runs occurred, the run with the fastest interval lasting 6 beats with a max rate of 136 bpm, the longest lasting 14 beats with an avg rate of 111 bpm. Isolated SVEs were rare (<1.0%), SVE Couplets were rare (<1.0%), and SVE Triplets were rare (<1.0%).  There were no episodes of atrial fibrillation or flutter.  Isolated VEs were frequent (16.8%, 54102), VE Couplets were rare (<1.0%, 1214), and VE Triplets were rare (<1.0%, 198). Ventricular Bigeminy and Trigeminy were present.  VE was predominantly single morphology.               Recent Labs: 04/05/2023: Magnesium 2.2; TSH 1.270  Recent Lipid Panel    Component Value Date/Time   CHOL 145 02/03/2021 1609   TRIG 98 02/03/2021 1609   HDL 48 02/03/2021 1609   CHOLHDL 3.0 02/03/2021 1609   LDLCALC 79 02/03/2021 1609    Physical Exam:    VS:  There were no vitals taken for this visit.    Wt Readings from Last 3 Encounters:  06/20/23 178 lb 9.6 oz (81 kg)  04/05/23 177 lb (80.3 kg)   03/21/23 177 lb (80.3 kg)     GEN:  Well nourished, well developed in no acute distress HEENT: Normal NECK: No JVD; No carotid bruits LYMPHATICS: No lymphadenopathy CARDIAC: RRR, no murmurs, rubs, gallops RESPIRATORY:  Clear to auscultation without rales, wheezing or rhonchi  ABDOMEN: Soft, non-tender, non-distended MUSCULOSKELETAL:  No edema; No deformity  SKIN: Warm and dry NEUROLOGIC:  Alert and oriented x 3 PSYCHIATRIC:  Normal affect    Signed, Alejandro Herrlich, MD  09/25/2023 6:25 PM    [facility]

## 2023-09-26 ENCOUNTER — Ambulatory Visit: Payer: Medicare Other | Attending: Cardiology

## 2023-09-26 ENCOUNTER — Encounter: Payer: Self-pay | Admitting: Cardiology

## 2023-09-26 ENCOUNTER — Ambulatory Visit (INDEPENDENT_AMBULATORY_CARE_PROVIDER_SITE_OTHER): Payer: Medicare Other | Admitting: Cardiology

## 2023-09-26 VITALS — BP 96/60 | HR 65 | Ht 73.0 in | Wt 186.2 lb

## 2023-09-26 DIAGNOSIS — Z8679 Personal history of other diseases of the circulatory system: Secondary | ICD-10-CM

## 2023-09-26 DIAGNOSIS — E785 Hyperlipidemia, unspecified: Secondary | ICD-10-CM

## 2023-09-26 DIAGNOSIS — I493 Ventricular premature depolarization: Secondary | ICD-10-CM | POA: Insufficient documentation

## 2023-09-26 DIAGNOSIS — I4819 Other persistent atrial fibrillation: Secondary | ICD-10-CM | POA: Diagnosis not present

## 2023-09-26 DIAGNOSIS — Z9889 Other specified postprocedural states: Secondary | ICD-10-CM

## 2023-09-26 NOTE — Patient Instructions (Addendum)
Medication Instructions:  Your physician recommends that you continue on your current medications as directed. Please refer to the Current Medication list given to you today.  *If you need a refill on your cardiac medications before your next appointment, please call your pharmacy*   Lab Work: None If you have labs (blood work) drawn today and your tests are completely normal, you will receive your results only by: MyChart Message (if you have MyChart) OR A paper copy in the mail If you have any lab test that is abnormal or we need to change your treatment, we will call you to review the results.   Testing/Procedures: None   Follow-Up: At [facility], you and your health needs are our priority.  As part of our continuing mission to provide you with exceptional heart care, we have created designated Provider Care Teams.  These Care Teams include your primary Cardiologist (physician) and Advanced Practice Providers (APPs -  Physician Assistants and Nurse Practitioners) who all work together to provide you with the care you need, when you need it.  We recommend signing up for the patient portal called "MyChart".  Sign up information is provided on this After Visit Summary.  MyChart is used to connect with patients for Virtual Visits (Telemedicine).  Patients are able to view lab/test results, encounter notes, upcoming appointments, etc.  Non-urgent messages can be sent to your provider as well.   To learn more about what you can do with MyChart, go to [email or web address].    Your next appointment:   1 year(s)  Provider:   Norman Herrlich, MD    Other Instructions Start OTC Magnesium 250 - 400 mg daily        1. Avoid all over-the-counter antihistamines except Claritin/Loratadine and Zyrtec/Cetrizine. 2. Avoid all combination including cold sinus allergies flu decongestant and sleep medications 3. You can use Robitussin DM Mucinex and Mucinex DM for cough. 4. can use  Tylenol aspirin ibuprofen and naproxen but no combinations such as sleep or sinus.

## 2023-10-17 ENCOUNTER — Telehealth: Payer: Self-pay | Admitting: *Deleted

## 2023-10-17 NOTE — Telephone Encounter (Signed)
-----   Message from [facility] sent at 10/13/2023  4:21 PM EST ----- PVCs significantly reduced. No changes.

## 2023-10-17 NOTE — Telephone Encounter (Signed)
 Left message to call back

## 2023-10-26 NOTE — Telephone Encounter (Signed)
 Pt is returning call regarding results and is requesting a callback at [phone]. Please advise

## 2023-10-26 NOTE — Telephone Encounter (Signed)
 Left message for the patient to call back.

## 2023-10-26 NOTE — Telephone Encounter (Signed)
 Patient informed of results.

## 2024-01-12 ENCOUNTER — Telehealth: Payer: Self-pay | Admitting: Cardiology

## 2024-01-12 NOTE — Telephone Encounter (Signed)
 Pt c/o medication issue:  1. Name of Medication: metoprolol  tartrate (LOPRESSOR ) 25 MG tablet   2. How are you currently taking this medication (dosage and times per day)? 25mg  bid  3. Are you having a reaction (difficulty breathing--STAT)? No  4. What is your medication issue? Per pharmacy pt states taking whole tab twice a day, pharmacy requesting cb with updated rx

## 2024-01-12 NOTE — Telephone Encounter (Signed)
 Left voicemail for patient to call back as Metoprolol  Tartrate was changed at a visit with Dr. Lawana Pray on 06/19/24 from 1 tablet BID to 1/5 tablet BID per patient preference.

## 2024-01-13 MED ORDER — METOPROLOL TARTRATE 25 MG PO TABS: ORAL_TABLET | ORAL | 2 refills | Status: AC

## 2024-01-13 NOTE — Telephone Encounter (Signed)
 Pt states that he takes 12.5 mg Metoprolol  in the am and 25 mg in the pm. RX reconciled and refill sent.

## 2024-07-02 ENCOUNTER — Other Ambulatory Visit: Payer: Self-pay | Admitting: Cardiology

## 2024-07-02 NOTE — Telephone Encounter (Signed)
 Endocarditis prophylaxis

## 2024-07-09 ENCOUNTER — Encounter: Payer: Self-pay | Admitting: Internal Medicine

## 2024-07-09 ENCOUNTER — Ambulatory Visit: Payer: Self-pay | Admitting: Internal Medicine

## 2024-07-09 VITALS — BP 110/70 | HR 82 | Temp 97.7°F | Resp 18 | Ht 73.0 in | Wt 191.0 lb

## 2024-07-09 DIAGNOSIS — N401 Enlarged prostate with lower urinary tract symptoms: Secondary | ICD-10-CM | POA: Diagnosis not present

## 2024-07-09 DIAGNOSIS — R7303 Prediabetes: Secondary | ICD-10-CM

## 2024-07-09 DIAGNOSIS — I340 Nonrheumatic mitral (valve) insufficiency: Secondary | ICD-10-CM

## 2024-07-09 DIAGNOSIS — I480 Paroxysmal atrial fibrillation: Secondary | ICD-10-CM | POA: Diagnosis not present

## 2024-07-09 DIAGNOSIS — E782 Mixed hyperlipidemia: Secondary | ICD-10-CM

## 2024-07-09 DIAGNOSIS — J4520 Mild intermittent asthma, uncomplicated: Secondary | ICD-10-CM

## 2024-07-09 HISTORY — DX: Prediabetes: R73.03

## 2024-07-09 MED ORDER — ROSUVASTATIN CALCIUM 40 MG PO TABS: 40.0000 mg | ORAL_TABLET | Freq: Every day | ORAL | 3 refills | Status: AC

## 2024-07-09 NOTE — Progress Notes (Unsigned)
 New Patient Office Visit  Subjective    Patient ID: Alejandro Burnett, male    DOB: May 09, 1951  Age: 73 y.o. MRN: [id number]  CC:  Chief Complaint  Patient presents with   New Patient (Initial Visit)    New patient     HPI Selim Durden Swarm presents to establish care with us . He has history of asthma, heart problem, hyperlipidemia and    Outpatient Encounter Medications as of 07/09/2024  Medication Sig   amoxicillin -clavulanate (AUGMENTIN) 875-125 MG tablet Take 1 tablet by mouth 2 (two) times daily.   albuterol (PROVENTIL HFA;VENTOLIN HFA) 108 (90 Base) MCG/ACT inhaler Inhale 1-2 puffs into the lungs every 6 (six) hours as needed for wheezing or shortness of breath.    amoxicillin  (AMOXIL ) 500 MG capsule TAKE 4 CAPSULES BY MOUTH 45 MINUTES PRIOR TO DENTAL PROCEDURES CALL OFFICE FOR APPOINTMENT   esomeprazole (NEXIUM) 40 MG capsule Take 40 mg by mouth at bedtime.    finasteride  (PROSCAR ) 5 MG tablet Take 5 mg by mouth at bedtime.    fluticasone (FLONASE) 50 MCG/ACT nasal spray Place 1 spray into both nostrils daily as needed for allergies or rhinitis.    fluticasone-salmeterol (ADVAIR HFA) 230-21 MCG/ACT inhaler Inhale 2 puffs into the lungs 2 (two) times daily as needed (shortness of breath).   fluticasone-salmeterol (ADVAIR) 250-50 MCG/ACT AEPB Inhale 1 puff into the lungs as needed (shortness of breath).   metoprolol  tartrate (LOPRESSOR ) 25 MG tablet Take 12.5 mg (0.5 tablet) am and 25 mg (1 tablet) pm.   montelukast  (SINGULAIR ) 10 MG tablet Take 10 mg by mouth daily.   rosuvastatin (CRESTOR) 40 MG tablet Take 40 mg by mouth daily.   No facility-administered encounter medications on file as of 07/09/2024.    Past Medical History:  Diagnosis Date   Acute venous embolism and thrombosis of deep vessels of proximal lower extremity (HCC) 03/16/2019   Asthma    Asthma, chronic 04/17/2014   Atrial fibrillation (HCC) 03/16/2019   Benign prostatic hyperplasia with urinary  obstruction    BPH (benign prostatic hyperplasia)    Chest pain 08/08/2018   Coughing blood 08/25/2018   Dyslipidemia    Early cataracts, bilateral    Gastroesophageal reflux disease    Hematuria 04/17/2014   Microscopic   Hyperlipemia    Long term (current) use of anticoagulants 03/16/2019   Migraine headache    Migraines    Mitral regurgitation    Mitral valve prolapse    Nephrolithiasis 11/04/2015   Non-rheumatic mitral regurgitation    Normal coronary arteries    Palpitation 05/15/2018   Paroxysmal atrial fibrillation (HCC)    Pre-diabetes    S/P minimally invasive maze operation for atrial fibrillation 03/07/2019   Complete bilateral atrial lesion set using cryothermy and bipolar radiofrequency ablation with clipping of LA appendage via right mini thoracotomy approach   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 OR;  Service: Open Heart Surgery;  Laterality: N/A;   MITRAL VALVE REPAIR Right 03/07/2019   Procedure: MINIMALLY INVASIVE MITRAL VALVE REPAIR (MVR) USING MEMO  4D SIZE 34;  Surgeon: Dusty Sudie DEL, MD;  Location: [facility] OR;  Service: Open Heart Surgery;  Laterality: Right;   RIGHT/LEFT HEART CATH AND CORONARY ANGIOGRAPHY N/A 10/09/2018   Procedure: RIGHT/LEFT HEART CATH AND CORONARY ANGIOGRAPHY;  Surgeon: Wonda Sharper, MD;  Location: [facility] INVASIVE CV LAB;  Service: Cardiovascular;  Laterality: N/A;   TEE WITHOUT CARDIOVERSION N/A 08/24/2018   Procedure: TRANSESOPHAGEAL ECHOCARDIOGRAM (TEE);  Surgeon: Francyne Headland, MD;  Location: [facility] ENDOSCOPY;   Service: Cardiovascular;  Laterality: N/A;   TEE WITHOUT CARDIOVERSION N/A 03/07/2019   Procedure: TRANSESOPHAGEAL ECHOCARDIOGRAM (TEE);  Surgeon: Dusty Sudie DEL, MD;  Location: [facility] OR;  Service: Open Heart Surgery;  Laterality: N/A;   TONSILLECTOMY      Family History  Problem Relation Age of Onset   Hypertension Mother    Cancer Father     Social History   Socioeconomic History   Marital status: Widowed    Spouse name: Not on file   Number of children: Not on file   Years of education: Not on file   Highest education level: Not on file  Occupational History   Not on file  Tobacco Use   Smoking status: Former    Current packs/day: 0.00    Types: Cigarettes    Quit date: 88    Years since quitting: 39.9   Smokeless tobacco: Former    Types: Associate Professor status: Never Used  Substance and Sexual Activity   Alcohol use: Never   Drug use: Never   Sexual activity: Yes    Birth control/protection: None  Other Topics Concern   Not on file  Social History Narrative   Not on file   Social Drivers of Health   Financial Resource Strain: Not on file  Food Insecurity: Not on file  Transportation Needs: Not on file  Physical Activity: Not on file  Stress: Not on file  Social Connections: Not on file  Intimate Partner Violence: Not on file    Review of Systems  Constitutional: Negative.   HENT: Negative.    Respiratory: Negative.    Cardiovascular: Negative.   Gastrointestinal: Negative.   Neurological: Negative.         Objective    BP 110/70   Pulse 82   Temp 97.7 F (36.5 C)   Resp 18   Ht 6' 1 (1.854 m)   Wt 191 lb (86.6 kg)   SpO2 93%   BMI 25.20 kg/m   Physical Exam Constitutional:      Appearance: Normal appearance.  Eyes:     Extraocular Movements: Extraocular movements intact.     Pupils: Pupils are equal, round, and reactive to light.  Cardiovascular:     Rate and Rhythm: Normal rate and regular rhythm.     Heart sounds:  Normal heart sounds.  Pulmonary:     Effort: Pulmonary effort is normal.     Breath sounds: Normal breath sounds.  Abdominal:     General: Bowel sounds are normal.     Palpations: Abdomen is soft.  Neurological:     General: No focal deficit present.     Mental Status: He is alert and oriented to person, place, and time.     {Labs (Optional):23779}    Assessment & Plan:   Problem List Items Addressed This Visit   None   Return in about 3 weeks (around 07/30/2024) for For AWE on next visit.   Roetta Dare, MD

## 2024-07-10 NOTE — Assessment & Plan Note (Signed)
 He has borderline diabetes.  I will repeat labs on next visit.

## 2024-07-10 NOTE — Assessment & Plan Note (Signed)
 He takes montelukast  10 mg daily and Advair Diskus as needed only.

## 2024-07-10 NOTE — Assessment & Plan Note (Signed)
 He takes an acid tried 5 mg daily.

## 2024-07-10 NOTE — Assessment & Plan Note (Signed)
 Status post Maze procedure.  His heart rate is in sinus rhythm rate is controlled and he does not take any anticoagulation.

## 2024-07-10 NOTE — Assessment & Plan Note (Signed)
 Status post matter valve replacement in 2020.

## 2024-07-10 NOTE — Assessment & Plan Note (Signed)
 He takes rosuvastatin 40 mg daily without any side effect.  I will repeat labs on next visit.

## 2024-08-13 ENCOUNTER — Encounter: Payer: Self-pay | Admitting: Internal Medicine

## 2024-08-13 ENCOUNTER — Ambulatory Visit: Admitting: Internal Medicine

## 2024-08-13 VITALS — BP 120/70 | HR 67 | Temp 97.8°F | Resp 18 | Ht 73.0 in | Wt 189.0 lb

## 2024-08-13 DIAGNOSIS — I480 Paroxysmal atrial fibrillation: Secondary | ICD-10-CM

## 2024-08-13 DIAGNOSIS — N138 Other obstructive and reflux uropathy: Secondary | ICD-10-CM

## 2024-08-13 DIAGNOSIS — R7303 Prediabetes: Secondary | ICD-10-CM | POA: Diagnosis not present

## 2024-08-13 DIAGNOSIS — K219 Gastro-esophageal reflux disease without esophagitis: Secondary | ICD-10-CM

## 2024-08-13 DIAGNOSIS — E785 Hyperlipidemia, unspecified: Secondary | ICD-10-CM

## 2024-08-13 DIAGNOSIS — G43109 Migraine with aura, not intractable, without status migrainosus: Secondary | ICD-10-CM | POA: Diagnosis not present

## 2024-08-13 DIAGNOSIS — N401 Enlarged prostate with lower urinary tract symptoms: Secondary | ICD-10-CM | POA: Diagnosis not present

## 2024-08-13 DIAGNOSIS — Z0000 Encounter for general adult medical examination without abnormal findings: Secondary | ICD-10-CM

## 2024-08-13 HISTORY — DX: Encounter for general adult medical examination without abnormal findings: Z00.00

## 2024-08-13 MED ORDER — TETANUS-DIPHTHERIA TOXOIDS TD 5-2 LF/0.5ML IM SUSP: 0.5000 mL | Freq: Once | INTRAMUSCULAR | 0 refills | Status: AC

## 2024-08-13 NOTE — Assessment & Plan Note (Signed)
 He does not want to take any medication .

## 2024-08-13 NOTE — Assessment & Plan Note (Signed)
 I will do PSA and he will follows with urologist

## 2024-08-13 NOTE — Progress Notes (Signed)
 "  Office Visit  Subjective   Patient ID: Alejandro Burnett   DOB: 1951-04-03   Age: 73 y.o.   MRN: [id number]   Chief Complaint Chief Complaint  Patient presents with   Annual Exam    Annual exam      History of Present Illness 73 years old male is here for annual wellness examination. He is widowed, he does not smoke, he does not drink. He denies any fall with in last 1 year. He is independent in all ADL.   He is does not get flu shot. He has one pneumonia vaccine. He did not have shingle vaccine. He did not have shingle vaccine. His last tetanus booster was more than 10 years ago.    He follows with urologist for BPH and they are monitoring his PSA. He is due for repeat colonoscopy in 2026 in [facility]. He get it every 10 years..    He has mitral valve prolapse status post mitral valve replacement March 07, 2019. He has a paroxysmal atrial fibrillation status post Maze procedure at the same time.  He also has a history of DVT but he does not take any anticoagulation now.  His echo done shows ejection fraction of 50-55%.  He do not have any shortness of breath or chest pain.  He follows with cardiologist.     He also has a history of asthma for that he take montelukast  10 mg daily and Advair discuss p.r.n.SABRASABRA  He says the more unless his symptoms are controlled.     He also has hyperlipidemia and he take rosuvastatin  40 mg daily without any side effect.  He is due for his labs.      He has BPH and history of kidney stone.  He takes finasteride  5 mg daily and he follows with urologist.     He also has gastroesophageal reflux disease and he take Nexium 40 mg daily.    Past Medical History Past Medical History:  Diagnosis Date   Acute venous embolism and thrombosis of deep vessels of proximal lower extremity (HCC) 03/16/2019   Asthma    Asthma, chronic 04/17/2014   Atrial fibrillation (HCC) 03/16/2019   Benign prostatic hyperplasia with urinary obstruction    BPH (benign prostatic  hyperplasia)    Chest pain 08/08/2018   Coughing blood 08/25/2018   Dyslipidemia    Early cataracts, bilateral    Gastroesophageal reflux disease    Hematuria 04/17/2014   Microscopic   Hyperlipemia    Long term (current) use of anticoagulants 03/16/2019   Migraine headache    Migraines    Mitral regurgitation    Mitral valve prolapse    Nephrolithiasis 11/04/2015   Non-rheumatic mitral regurgitation    Normal coronary arteries    Palpitation 05/15/2018   Paroxysmal atrial fibrillation (HCC)    Pre-diabetes    S/P minimally invasive maze operation for atrial fibrillation 03/07/2019   Complete bilateral atrial lesion set using cryothermy and bipolar radiofrequency ablation with clipping of LA appendage via right mini thoracotomy approach   S/P minimally invasive mitral valve repair 03/07/2019   Complex valvuloplasty including triangular resection of middle scallop of posterior leaflet and 34 mm Sorin Memo 4D ring annuloplasty via right mini thoracotomy approach     Allergies Allergies[1]   Review of Systems Review of Systems  Constitutional: Negative.   HENT: Negative.    Respiratory: Negative.    Cardiovascular: Negative.   Gastrointestinal: Negative.   Neurological: Negative.  Objective:    Vitals BP 120/70   Pulse 67   Temp 97.8 F (36.6 C)   Resp 18   Ht 6' 1 (1.854 m)   Wt 189 lb (85.7 kg)   SpO2 97%   BMI 24.94 kg/m    Physical Examination Physical Exam Constitutional:      Appearance: Normal appearance.  HENT:     Head: Normocephalic and atraumatic.  Eyes:     Extraocular Movements: Extraocular movements intact.     Conjunctiva/sclera: Conjunctivae normal.  Cardiovascular:     Rate and Rhythm: Normal rate and regular rhythm.     Heart sounds: Normal heart sounds.  Pulmonary:     Effort: Pulmonary effort is normal.     Breath sounds: Normal breath sounds.  Abdominal:     General: Bowel sounds are normal.     Palpations: Abdomen is  soft.  Neurological:     General: No focal deficit present.     Mental Status: He is alert and oriented to person, place, and time.        Assessment & Plan:   Paroxysmal atrial fibrillation (HCC) S/p maze procedure and he is in sinus rhythm. He is not on anticoagulation  Migraine headache He does not want to take any medication.   Gastroesophageal reflux disease He takes esomeprazole only as needed  Benign prostatic hyperplasia with urinary obstruction I will do PSA and he will follows with urologist  Well adult exam I will send prescription of tetanus. He does not want shingle vaccine    Return in about 3 months (around 11/11/2024).   Roetta Dare, MD      [1]  Allergies Allergen Reactions   Apple Juice     Migraines   Banana     Migraines   Peanut-Containing Drug Products     Migraines    Strawberry (Diagnostic)     Migraines   Strawberry Extract     Migraines   Tamsulosin Other (See Comments)    Goes into afib     "

## 2024-08-13 NOTE — Assessment & Plan Note (Signed)
 I will send prescription of tetanus. He does not want shingle vaccine

## 2024-08-13 NOTE — Assessment & Plan Note (Signed)
 He takes esomeprazole only as needed

## 2024-08-13 NOTE — Assessment & Plan Note (Signed)
 S/p maze procedure and he is in sinus rhythm. He is not on anticoagulation

## 2024-08-14 ENCOUNTER — Ambulatory Visit: Payer: Self-pay

## 2024-08-14 LAB — LIPID PANEL
Chol/HDL Ratio: 2.7 ratio (ref 0.0–5.0)
Cholesterol, Total: 142 mg/dL (ref 100–199)
HDL: 53 mg/dL
LDL Chol Calc (NIH): 76 mg/dL (ref 0–99)
Triglycerides: 61 mg/dL (ref 0–149)
VLDL Cholesterol Cal: 13 mg/dL (ref 5–40)

## 2024-08-14 LAB — CMP14 + ANION GAP
ALT: 21 IU/L (ref 0–44)
AST: 25 IU/L (ref 0–40)
Albumin: 4 g/dL (ref 3.8–4.8)
Alkaline Phosphatase: 65 IU/L (ref 47–123)
Anion Gap: 13 mmol/L (ref 10.0–18.0)
BUN/Creatinine Ratio: 19 (ref 10–24)
BUN: 18 mg/dL (ref 8–27)
Bilirubin Total: 0.5 mg/dL (ref 0.0–1.2)
CO2: 24 mmol/L (ref 20–29)
Calcium: 9.4 mg/dL (ref 8.6–10.2)
Chloride: 101 mmol/L (ref 96–106)
Creatinine, Ser: 0.97 mg/dL (ref 0.76–1.27)
Globulin, Total: 2.7 g/dL (ref 1.5–4.5)
Glucose: 98 mg/dL (ref 70–99)
Potassium: 4.7 mmol/L (ref 3.5–5.2)
Sodium: 138 mmol/L (ref 134–144)
Total Protein: 6.7 g/dL (ref 6.0–8.5)
eGFR: 83 mL/min/1.73

## 2024-08-14 LAB — HEMOGLOBIN A1C
Est. average glucose Bld gHb Est-mCnc: 134 mg/dL
Hgb A1c MFr Bld: 6.3 % — ABNORMAL HIGH (ref 4.8–5.6)

## 2024-08-14 LAB — PSA: Prostate Specific Ag, Serum: 0.3 ng/mL (ref 0.0–4.0)

## 2024-08-14 NOTE — Progress Notes (Signed)
 Patient called.  Patient aware. His labs are good.

## 2024-09-10 ENCOUNTER — Other Ambulatory Visit: Payer: Self-pay | Admitting: Internal Medicine

## 2024-09-26 NOTE — Progress Notes (Unsigned)
 " Cardiology Office Note:    Date:  09/27/2024   ID:  Alejandro Burnett, DOB 11-May-1951, MRN [id number]  PCP:  Caleen Dirks, MD  Cardiologist:  Redell Leiter, MD    Referring MD: No ref. provider found    ASSESSMENT:    1. S/P mitral valve repair   2. S/P Maze operation for atrial fibrillation   3. PAF (paroxysmal atrial fibrillation) (HCC)   4. PVC (premature ventricular contraction)    PLAN:    In order of problems listed above:  Alejandro Burnett is doing very well following his mitral valve repair [place]  Heart Association class I maintaining sinus rhythm after maze procedure and has no PVCs on his EKG today Continue aspirin  statin beta-blocker and endocarditis prophylaxis   Next appointment: 1 year   Medication Adjustments/Labs and Tests Ordered: Current medicines are reviewed at length with the patient today.  Concerns regarding medicines are outlined above.  Orders Placed This Encounter  Procedures   EKG 12-Lead   No orders of the defined types were placed in this encounter.    History of Present Illness:    Alejandro Burnett is a 74 y.o. male with a hx of mitral valve prolapse with severe valvular regurgitation mitral valve minimally invasive robotic repair 03/07/2019 paroxysmal atrial fibrillation with surgical maze procedure dyslipidemia and frequent PVCs PVC burden 16.8% he was last seen 09/26/2023.  His echocardiogram August 2024 did not show findings of mitral annular dysfunction  Compliance with diet, lifestyle and medications: Yes  He has had a good year no cardiovascular symptoms remains vigorous and active no exercise intolerance edema shortness of breath chest pain palpitation or syncope Had no fever or chills he does take aspirin  81 mg daily and follows endocarditis prophylaxis with amoxicillin  Past Medical History:  Diagnosis Date   Acute venous embolism and thrombosis of deep vessels of proximal lower extremity (HCC) 03/16/2019   Asthma    Asthma, chronic  04/17/2014   Atrial fibrillation (HCC) 03/16/2019   Benign prostatic hyperplasia with urinary obstruction    Borderline diabetes mellitus 07/09/2024   BPH (benign prostatic hyperplasia)    Chest pain 08/08/2018   Coughing blood 08/25/2018   Dyslipidemia    Early cataracts, bilateral    Gastroesophageal reflux disease    Hematuria 04/17/2014   Microscopic   Hyperlipemia    Long term (current) use of anticoagulants 03/16/2019   Migraine headache    Migraines    Mitral regurgitation    Mitral valve prolapse    Nephrolithiasis 11/04/2015   Non-rheumatic mitral regurgitation    Normal coronary arteries    Palpitation 05/15/2018   Paroxysmal atrial fibrillation (HCC)    Pre-diabetes    S/P minimally invasive maze operation for atrial fibrillation 03/07/2019   Complete bilateral atrial lesion set using cryothermy and bipolar radiofrequency ablation with clipping of LA appendage via right mini thoracotomy approach   S/P minimally invasive mitral valve repair 03/07/2019   Complex valvuloplasty including triangular resection of middle scallop of posterior leaflet and 34 mm Sorin Memo 4D ring annuloplasty via right mini thoracotomy approach   Well adult exam 08/13/2024    Current Medications: Active Medications[1]    EKGs/Labs/Other Studies Reviewed:    The following studies were reviewed today:  Cardiac Studies & Procedures   ______________________________________________________________________________________________ CARDIAC CATHETERIZATION  CARDIAC CATHETERIZATION 10/09/2018  Conclusion 1.  Angiographically normal coronary arteries (right dominant) 2.  Low intracardiac filling pressures 3. Systemic hypotension during procedure related to conscious sedation - pt remained cognitively  intact and stable throughout. BP normalized after sedation wore off.  Recommendations: The patient will continue preoperative evaluation for minimally invasive mitral valve repair.  He has normal  coronary arteries.  He is incidentally noted to be in atrial fibrillation with mildly increased ventricular rate.  I will start him on a low-dose of metoprolol  and he has scheduled follow-up with Dr. Dusty next week.  Will not start him on anticoagulation since he will have surgery in the near future requiring temporary discontinuation of anticoagulant drugs.  Findings Coronary Findings Diagnostic  Dominance: Right  Left Main Vessel is large.  Left Anterior Descending The LAD is a large vessel that reaches the apex.  The first diagonal is large and divides into twin vessels.  There is no stenosis throughout the LAD or diagonal branches.  Left Circumflex Vessel is moderate in size. Vessel is angiographically normal. The circumflex is patent without stenosis.  Right Coronary Artery Vessel is large. Vessel is angiographically normal. Large, dominant RCA without stenosis.  The PDA and PLA branches are widely patent.  Intervention  No interventions have been documented.     ECHOCARDIOGRAM  ECHOCARDIOGRAM COMPLETE 08/03/2021  Narrative ECHOCARDIOGRAM REPORT    Patient Name:   Alejandro Burnett Date of Exam: 08/03/2021 Medical Rec #:  [id number]        Height:       73.0 in Accession #:    [id number]       Weight:       190.0 lb Date of Birth:  April 15, 1951        BSA:          2.105 m Patient Age:    70 years         BP:           118/70 mmHg Patient Gender: M                HR:           64 bpm. Exam Location:  [facility]  Procedure: 2D Echo, Cardiac Doppler, Color Doppler and Strain Analysis  Indications:    S/P Mitral Valve repair Z98.89 Atrial Fibrillation I48.91  History:        Patient has prior history of Echocardiogram examinations, most recent 07/12/2019. Non-rheumatic mitral regurgitation, Arrythmias:Palpitation, Signs/Symptoms:Chest Pain; Risk Factors:Dyslipidemia and Pre-diabetes.  Mitral Valve: valve is present in the mitral position. Procedure Date: 03/06/2021 ?  Minimally-Invasive Mitral Valve Repair.  Sonographer:    Lynwood Silvas RDCS Referring Phys: [id number] Antwone Capozzoli J Evans Levee  IMPRESSIONS   1. Left ventricular ejection fraction, by estimation, is 50 to 55%. The left ventricle has low normal function. The left ventricle has no regional wall motion abnormalities. There is mild concentric left ventricular hypertrophy. Left ventricular diastolic parameters are indeterminate. 2. Right ventricular systolic function is normal. The right ventricular size is normal. There is normal pulmonary artery systolic pressure. 3. Left atrial size was mildly dilated. 4. The mitral valve has been repaired. No evidence of mitral valve regurgitation. No evidence of mitral stenosis. There is a present in the mitral position. Procedure Date: 03/06/2021 ? Minimally-Invasive Mitral Valve Repair. 5. The aortic valve is tricuspid. Aortic valve regurgitation is trivial. No aortic stenosis is present. 6. There is mild dilatation of the ascending aorta, measuring 38 mm. 7. The inferior vena cava is normal in size with greater than 50% respiratory variability, suggesting right atrial pressure of 3 mmHg.  FINDINGS Left Ventricle: Left ventricular ejection fraction, by estimation, is 50 to 55%.  The left ventricle has low normal function. The left ventricle has no regional wall motion abnormalities. Global longitudinal strain performed but not reported based on interpreter judgement due to suboptimal tracking. The left ventricular internal cavity size was normal in size. There is mild concentric left ventricular hypertrophy. Left ventricular diastolic parameters are indeterminate.  Right Ventricle: The right ventricular size is normal. No increase in right ventricular wall thickness. Right ventricular systolic function is normal. There is normal pulmonary artery systolic pressure. The tricuspid regurgitant velocity is 2.62 m/s, and with an assumed right atrial pressure of 3 mmHg, the  estimated right ventricular systolic pressure is 30.5 mmHg.  Left Atrium: Left atrial size was mildly dilated.  Right Atrium: Right atrial size was normal in size.  Pericardium: There is no evidence of pericardial effusion.  Mitral Valve: The mitral valve has been repaired/replaced. No evidence of mitral valve regurgitation. There is a present in the mitral position. Procedure Date: 03/06/2021 ? Minimally-Invasive Mitral Valve Repair. No evidence of mitral valve stenosis. MV peak gradient, 8.4 mmHg. The mean mitral valve gradient is 4.0 mmHg.  Tricuspid Valve: The tricuspid valve is normal in structure. Tricuspid valve regurgitation is mild . No evidence of tricuspid stenosis.  Aortic Valve: The aortic valve is tricuspid. Aortic valve regurgitation is trivial. No aortic stenosis is present.  Pulmonic Valve: The pulmonic valve was normal in structure. Pulmonic valve regurgitation is mild. No evidence of pulmonic stenosis.  Aorta: The aortic root is normal in size and structure and the aortic arch was not well visualized. There is mild dilatation of the ascending aorta, measuring 38 mm.  Venous: The pulmonary veins were not well visualized. The inferior vena cava is normal in size with greater than 50% respiratory variability, suggesting right atrial pressure of 3 mmHg.  IAS/Shunts: No atrial level shunt detected by color flow Doppler.   LEFT VENTRICLE PLAX 2D LVIDd:         4.40 cm      Diastology LVIDs:         3.35 cm      LV e' medial:    5.33 cm/s LV PW:         1.10 cm      LV E/e' medial:  28.1 LV IVS:        1.20 cm      LV e' lateral:   9.41 cm/s LVOT diam:     2.20 cm      LV E/e' lateral: 15.9 LV SV:         62 LV SV Index:   29 LVOT Area:     3.80 cm  LV Volumes (MOD) LV vol d, MOD A2C: 92.4 ml LV vol d, MOD A4C: 110.0 ml LV vol s, MOD A2C: 44.8 ml LV vol s, MOD A4C: 60.2 ml LV SV MOD A2C:     47.6 ml LV SV MOD A4C:     110.0 ml LV SV MOD BP:      47.9 ml  RIGHT  VENTRICLE            IVC RV S prime:     9.76 cm/s  IVC diam: 1.60 cm TAPSE (M-mode): 1.8 cm  LEFT ATRIUM             Index        RIGHT ATRIUM           Index LA diam:        4.40 cm 2.09 cm/m  RA Area:     18.70 cm LA Vol (A2C):   80.6 ml 38.29 ml/m  RA Volume:   52.50 ml  24.94 ml/m LA Vol (A4C):   76.1 ml 36.15 ml/m LA Biplane Vol: 80.8 ml 38.38 ml/m AORTIC VALVE             PULMONIC VALVE LVOT Vmax:   70.70 cm/s  PR End Diast Vel: 3.41 msec LVOT Vmean:  50.200 cm/s LVOT VTI:    0.162 m  AORTA Ao Root diam: 4.00 cm Ao Asc diam:  3.80 cm Ao Desc diam: 2.00 cm  MITRAL VALVE                TRICUSPID VALVE MV Area (PHT): 2.33 cm     TR Peak grad:   27.5 mmHg MV Area VTI:   1.38 cm     TR Vmax:        262.00 cm/s MV Peak grad:  8.4 mmHg MV Mean grad:  4.0 mmHg     SHUNTS MV Vmax:       1.45 m/s     Systemic VTI:  0.16 m MV Vmean:      88.6 cm/s    Systemic Diam: 2.20 cm MV Decel Time: 325 msec MV E velocity: 150.00 cm/s MV A velocity: 46.50 cm/s MV E/A ratio:  3.23  Redell Leiter MD Electronically signed by Redell Leiter MD Signature Date/Time: 08/03/2021/4:22:41 PM    Final   TEE  ECHO INTRAOPERATIVE TEE 03/07/2019  Narrative *INTRAOPERATIVE TRANSESOPHAGEAL REPORT *    Patient Name:   Alejandro Burnett Date of Exam: 03/07/2019 Medical Rec #:  [id number]        Height:       73.0 in Accession #:    [id number]       Weight:       185.0 lb Date of Birth:  1951-05-29        BSA:          2.08 m Patient Age:    68 years         BP:           119/70 mmHg Patient Gender: M                HR:           68 bpm. Exam Location:  Anesthesiology  Transesophogeal exam was perform intraoperatively during surgical procedure. Patient was closely monitored under general anesthesia during the entirety of examination.  Indications:     Mitral Regurgitation History:         Atrial Fibrillation Mitral Valve Disease and Mitral Valve Prolapse Signs/Symptoms: Chest Pain Risk  Factors: Dyslipidemia and Former Smoker. Sonographer:     Recardo Rosa RDCS Performing Phys: Kelly Mace MD Diagnosing Phys: Kelly Mace MD  Complications: No known complications during this procedure. POST-OP IMPRESSIONS - Left Ventricle: The left ventricle is unchanged from pre-bypass. LV function remains low normal. There is septal flattening, resulting from pacing. Overall EF 45-50%, and appears to be improving with time off of CPB. - Aorta: The aorta appears unchanged from pre-bypass. - Left Atrial Appendage: Has been surgically closed and oversewn. - Aortic Valve: The aortic valve appears unchanged from pre-bypass. - Mitral Valve: No stenosis present, with mean gradient 1 mmHg. No regurgitation post repair. The gradient recorded across the repaired valve is within the expected range. - Tricuspid Valve: The tricuspid valve appears unchanged from pre-bypass. - Interatrial Septum: The interatrial septum appears unchanged  from pre-bypass.  PRE-OP FINDINGS Left Ventricle: The left ventricle has low normal systolic function, with a visual ejection fraction of 50-55%. Simpson's 47%. The cavity size was mildly dilated. There is no increase in left ventricular wall thickness. There are no regional wall motion abnormalities.  Right Ventricle: The right ventricle has normal systolic function. The cavity was mildly enlarged. There is no increase in right ventricular wall thickness.  Left Atrium: Left atrial size was somewhat dilated. The left atrial appendage is well visualized and there is no evidence of thrombus present. Left atrial appendage velocity is normal at greater than 40 cm/s.  Right Atrium: Right atrial size was mildly dilated. Right atrial pressure is estimated at 10 mmHg. the interatrial septum is seen bowed toward the left, consistent with elevated right atrial pressures.  Interatrial Septum: No atrial level shunt detected by color flow Doppler. There is left  bowing of the interatrial septum, suggestive of elevated right atrial pressure.  Pericardium: The pericardium was not assessed.  Mitral Valve: The mitral valve is myxomatous. Mild thickening of the mitral valve leaflet. No calcification of the mitral valve leaflet. Mitral valve regurgitation is severe by color flow Doppler. The MR jet is anteriorly-directed., with chordal involvment There is no evidence of mitral valve vegetation. Pulmonary venous flow is normal. There is severe prolapse of of the P2 segment of the posterior mitral leaflet, resulting in the anteriorly directed MR There ppears to be a single torn chordae tendonae to that P2 segment.  Tricuspid Valve: The tricuspid valve was normal in structure. Tricuspid valve regurgitation is very mild by color flow Doppler. The jet is directed toward the atrial septum. No TV vegetation was visualized.  Aortic Valve: The aortic valve is tricuspid, without thickening or calcification. Aortic valve regurgitation is trivial by color flow Doppler. There is no evidence of aortic valve stenosis, peak gradient 5 mmHg, mean gradient 3 mmHg. There is no evidence of a vegetation on the aortic valve.  Pulmonic Valve: The pulmonic valve was normal in structure, with normal function. No evidence of pulmonic stenosis. Pulmonic valve regurgitation is trivial, around the PA catheter, by color flow Doppler.   Aorta: The aortic root and ascending aorta are normal in size and structure.  Venous: The inferior vena cava is normal in size with less than 50% respiratory variability.  +--------------+-------++ LEFT VENTRICLE        +--------------+-------++ PLAX 2D               +--------------+-------++ LVIDd:        5.10 cm +--------------+-------++ LVIDs:        3.50 cm +--------------+-------++ LV SV:        73 ml   +--------------+-------++ LV SV Index:  35.02   +--------------+-------++                        +--------------+-------++  +------------------+---------++ LV Volumes (MOD)            +------------------+---------++ LV area d, A4C:   42.90 cm +------------------+---------++ LV area s, A4C:   27.50 cm +------------------+---------++ LV major d, A4C:  8.87 cm   +------------------+---------++ LV major s, A4C:  7.17 cm   +------------------+---------++ LV vol d, MOD A4C:168.0 ml  +------------------+---------++ LV vol s, MOD A4C:89.2 ml   +------------------+---------++ LV SV MOD A4C:    168.0 ml  +------------------+---------++  +-------------+-----------++ AORTIC VALVE             +-------------+-----------++ AV Vmax:  107.00 cm/s +-------------+-----------++ AV Vmean:    74.400 cm/s +-------------+-----------++ AV VTI:      0.237 m     +-------------+-----------++ AV Peak Grad:4.6 mmHg    +-------------+-----------++ AV Mean Grad:3.0 mmHg    +-------------+-----------++  +-------------+---------++ MITRAL VALVE           +-------------+---------++ MV Peak grad:3.1 mmHg  +-------------+---------++ MV Mean grad:1.0 mmHg  +-------------+---------++ MV Vmax:     0.88 m/s  +-------------+---------++ MV Vmean:    45.9 cm/s +-------------+---------++ MV VTI:      0.27 m    +-------------+---------++ +-------------+-----------++ MR Peak grad:62.3 mmHg   +-------------+-----------++ MR Vmax:     394.50 cm/s +-------------+-----------++   Kelly Mace MD Electronically signed by Kelly Mace MD Signature Date/Time: 03/07/2019/5:08:33 PM    Final  MONITORS  LONG TERM MONITOR (3-14 DAYS) 10/11/2023  Narrative Patch Wear Time:  7 days and 8 hours  HR 50 - 169, average 73 bpm. 10 nonsustained SVT (longest 7 beats) and 3 nonsustained VT (longest 9 beats) No atrial fibrillation detected. Rare supraventricular ectopy. Occasional ventricular ectopy,  2.8%. No sustained arrhythmias. Symptom trigger episodes correspond to sinus rhythm   Will Gladis [facility] Cardiac Electrophysiology       ______________________________________________________________________________________________          Recent Labs: 08/13/2024: ALT 21; BUN 18; Creatinine, Ser 0.97; Potassium 4.7; Sodium 138  Recent Lipid Panel    Component Value Date/Time   CHOL 142 08/13/2024 0952   TRIG 61 08/13/2024 0952   HDL 53 08/13/2024 0952   CHOLHDL 2.7 08/13/2024 0952   LDLCALC 76 08/13/2024 0952    Physical Exam:    VS:  BP (!) 92/56   Pulse 72   Ht 6' 1 (1.854 m)   Wt 192 lb (87.1 kg)   SpO2 96%   BMI 25.33 kg/m     Wt Readings from Last 3 Encounters:  09/27/24 192 lb (87.1 kg)  08/13/24 189 lb (85.7 kg)  07/09/24 191 lb (86.6 kg)     GEN:  Well nourished, well developed in no acute distress HEENT: Normal NECK: No JVD; No carotid bruits LYMPHATICS: No lymphadenopathy CARDIAC: RRR, no murmurs, rubs, gallops RESPIRATORY:  Clear to auscultation without rales, wheezing or rhonchi  ABDOMEN: Soft, non-tender, non-distended MUSCULOSKELETAL:  No edema; No deformity  SKIN: Warm and dry NEUROLOGIC:  Alert and oriented x 3 PSYCHIATRIC:  Normal affect    Signed, Redell Leiter, MD  09/27/2024 10:14 AM    [facility]      [1]  No outpatient medications have been marked as taking for the 09/27/24 encounter (Office Visit) with Leiter Redell PARAS, MD.   "

## 2024-09-27 ENCOUNTER — Encounter: Payer: Self-pay | Admitting: Cardiology

## 2024-09-27 ENCOUNTER — Ambulatory Visit: Admitting: Cardiology

## 2024-09-27 VITALS — BP 92/56 | HR 72 | Ht 73.0 in | Wt 192.0 lb

## 2024-09-27 DIAGNOSIS — Z8679 Personal history of other diseases of the circulatory system: Secondary | ICD-10-CM | POA: Diagnosis not present

## 2024-09-27 DIAGNOSIS — Z9889 Other specified postprocedural states: Secondary | ICD-10-CM

## 2024-09-27 DIAGNOSIS — I493 Ventricular premature depolarization: Secondary | ICD-10-CM

## 2024-09-27 DIAGNOSIS — I480 Paroxysmal atrial fibrillation: Secondary | ICD-10-CM | POA: Diagnosis not present

## 2024-09-27 MED ORDER — ASPIRIN 81 MG PO TBEC: 81.0000 mg | DELAYED_RELEASE_TABLET | Freq: Every day | ORAL | 3 refills | Status: AC

## 2024-09-27 NOTE — Patient Instructions (Signed)
 Medication Instructions:  Your physician has recommended you make the following change in your medication:   START: Aspirin  81 mg daily   *If you need a refill on your cardiac medications before your next appointment, please call your pharmacy*  Lab Work: None If you have labs (blood work) drawn today and your tests are completely normal, you will receive your results only by: MyChart Message (if you have MyChart) OR A paper copy in the mail If you have any lab test that is abnormal or we need to change your treatment, we will call you to review the results.  Testing/Procedures: None  Follow-Up: At [facility], you and your health needs are our priority.  As part of our continuing mission to provide you with exceptional heart care, our providers are all part of one team.  This team includes your primary Cardiologist (physician) and Advanced Practice Providers or APPs (Physician Assistants and Nurse Practitioners) who all work together to provide you with the care you need, when you need it.  Your next appointment:   1 year(s)  Provider:   Redell Leiter, MD    We recommend signing up for the patient portal called MyChart.  Sign up information is provided on this After Visit Summary.  MyChart is used to connect with patients for Virtual Visits (Telemedicine).  Patients are able to view lab/test results, encounter notes, upcoming appointments, etc.  Non-urgent messages can be sent to your provider as well.   To learn more about what you can do with MyChart, go to [email or web address].   Other Instructions None

## 2024-11-12 ENCOUNTER — Ambulatory Visit: Admitting: Internal Medicine
